# Patient Record
Sex: Female | Born: 1935
Health system: Southern US, Community
[De-identification: ages and names within clinical notes are randomized; demographics above are authoritative.]

## PROBLEM LIST (undated history)

## (undated) DIAGNOSIS — D649 Anemia, unspecified: Secondary | ICD-10-CM

## (undated) DIAGNOSIS — K219 Gastro-esophageal reflux disease without esophagitis: Secondary | ICD-10-CM

## (undated) DIAGNOSIS — G2581 Restless legs syndrome: Secondary | ICD-10-CM

## (undated) DIAGNOSIS — E7800 Pure hypercholesterolemia, unspecified: Secondary | ICD-10-CM

## (undated) DIAGNOSIS — R918 Other nonspecific abnormal finding of lung field: Secondary | ICD-10-CM

## (undated) DIAGNOSIS — R911 Solitary pulmonary nodule: Secondary | ICD-10-CM

## (undated) DIAGNOSIS — C801 Malignant (primary) neoplasm, unspecified: Secondary | ICD-10-CM

## (undated) DIAGNOSIS — I10 Essential (primary) hypertension: Secondary | ICD-10-CM

## (undated) DIAGNOSIS — M8580 Other specified disorders of bone density and structure, unspecified site: Secondary | ICD-10-CM

## (undated) DIAGNOSIS — N189 Chronic kidney disease, unspecified: Secondary | ICD-10-CM

## (undated) DIAGNOSIS — M1990 Unspecified osteoarthritis, unspecified site: Secondary | ICD-10-CM

## (undated) DIAGNOSIS — K5790 Diverticulosis of intestine, part unspecified, without perforation or abscess without bleeding: Secondary | ICD-10-CM

## (undated) HISTORY — DX: Essential (primary) hypertension: I10

## (undated) HISTORY — DX: Other specified disorders of bone density and structure, unspecified site: M85.80

## (undated) HISTORY — PX: OTHER SURGICAL HISTORY: SHX169

## (undated) HISTORY — DX: Restless legs syndrome: G25.81

## (undated) HISTORY — DX: Pure hypercholesterolemia, unspecified: E78.00

## (undated) HISTORY — PX: ABDOMINAL HYSTERECTOMY: SHX81

## (undated) HISTORY — PX: EYE SURGERY: SHX253

## (undated) HISTORY — DX: Diverticulosis of intestine, part unspecified, without perforation or abscess without bleeding: K57.90

---

## 2000-03-20 ENCOUNTER — Encounter: Payer: Self-pay | Admitting: Cardiology

## 2000-03-20 ENCOUNTER — Encounter: Admission: RE | Admit: 2000-03-20 | Discharge: 2000-03-20 | Payer: Self-pay | Admitting: Cardiology

## 2001-06-04 ENCOUNTER — Other Ambulatory Visit: Admission: RE | Admit: 2001-06-04 | Discharge: 2001-06-04 | Payer: Self-pay | Admitting: Internal Medicine

## 2001-10-08 ENCOUNTER — Encounter: Payer: Self-pay | Admitting: Family Medicine

## 2001-10-08 ENCOUNTER — Encounter: Admission: RE | Admit: 2001-10-08 | Discharge: 2001-10-08 | Payer: Self-pay | Admitting: Family Medicine

## 2002-06-05 ENCOUNTER — Ambulatory Visit (HOSPITAL_COMMUNITY): Admission: RE | Admit: 2002-06-05 | Discharge: 2002-06-05 | Payer: Self-pay | Admitting: Gastroenterology

## 2007-01-10 ENCOUNTER — Ambulatory Visit (HOSPITAL_COMMUNITY): Admission: RE | Admit: 2007-01-10 | Discharge: 2007-01-10 | Payer: Self-pay | Admitting: Internal Medicine

## 2008-04-04 ENCOUNTER — Emergency Department (HOSPITAL_COMMUNITY): Admission: EM | Admit: 2008-04-04 | Discharge: 2008-04-04 | Payer: Self-pay | Admitting: Emergency Medicine

## 2011-01-05 ENCOUNTER — Encounter
Admission: RE | Admit: 2011-01-05 | Discharge: 2011-01-05 | Payer: Self-pay | Source: Home / Self Care | Attending: Internal Medicine | Admitting: Internal Medicine

## 2011-02-05 ENCOUNTER — Other Ambulatory Visit: Payer: Self-pay | Admitting: Internal Medicine

## 2011-02-05 DIAGNOSIS — R928 Other abnormal and inconclusive findings on diagnostic imaging of breast: Secondary | ICD-10-CM

## 2011-02-12 ENCOUNTER — Other Ambulatory Visit: Payer: Self-pay | Admitting: Internal Medicine

## 2011-02-12 ENCOUNTER — Ambulatory Visit
Admission: RE | Admit: 2011-02-12 | Discharge: 2011-02-12 | Disposition: A | Discharge status: Home / Self Care | Payer: Medicare Other | Source: Ambulatory Visit | Attending: Internal Medicine | Admitting: Internal Medicine

## 2011-02-12 ENCOUNTER — Ambulatory Visit
Admission: RE | Admit: 2011-02-12 | Discharge: 2011-02-12 | Disposition: A | Discharge status: Home / Self Care | Payer: Medicare Other | Source: Ambulatory Visit

## 2011-02-12 DIAGNOSIS — R928 Other abnormal and inconclusive findings on diagnostic imaging of breast: Secondary | ICD-10-CM

## 2011-02-16 ENCOUNTER — Other Ambulatory Visit: Payer: Self-pay | Admitting: Diagnostic Radiology

## 2011-02-16 ENCOUNTER — Ambulatory Visit
Admission: RE | Admit: 2011-02-16 | Discharge: 2011-02-16 | Disposition: A | Discharge status: Home / Self Care | Payer: Medicare Other | Source: Ambulatory Visit | Attending: Internal Medicine | Admitting: Internal Medicine

## 2011-02-16 ENCOUNTER — Other Ambulatory Visit: Payer: Self-pay | Admitting: Internal Medicine

## 2011-02-16 DIAGNOSIS — R928 Other abnormal and inconclusive findings on diagnostic imaging of breast: Secondary | ICD-10-CM

## 2011-09-25 LAB — DIFFERENTIAL
Basophils Absolute: 0
Basophils Relative: 0
Eosinophils Absolute: 0.1
Monocytes Relative: 11
Neutro Abs: 4
Neutrophils Relative %: 68

## 2011-09-25 LAB — CBC
HCT: 33.6 — ABNORMAL LOW
Hemoglobin: 12
MCHC: 35.7
RBC: 3.54 — ABNORMAL LOW
RDW: 13.5

## 2011-09-25 LAB — COMPREHENSIVE METABOLIC PANEL
ALT: 57 — ABNORMAL HIGH
Alkaline Phosphatase: 86
BUN: 48 — ABNORMAL HIGH
CO2: 27
GFR calc non Af Amer: 25 — ABNORMAL LOW
Glucose, Bld: 106 — ABNORMAL HIGH
Potassium: 3.5
Sodium: 138
Total Protein: 6.8

## 2011-09-25 LAB — URINALYSIS, ROUTINE W REFLEX MICROSCOPIC
Hgb urine dipstick: NEGATIVE
Nitrite: NEGATIVE
Protein, ur: NEGATIVE
Specific Gravity, Urine: 1.012
Urobilinogen, UA: 0.2

## 2011-09-25 LAB — URINE MICROSCOPIC-ADD ON: Urine-Other: NONE SEEN

## 2011-12-11 ENCOUNTER — Other Ambulatory Visit: Payer: Self-pay | Admitting: *Deleted

## 2012-01-04 ENCOUNTER — Other Ambulatory Visit: Payer: Self-pay | Admitting: Internal Medicine

## 2012-01-04 DIAGNOSIS — Z87891 Personal history of nicotine dependence: Secondary | ICD-10-CM | POA: Diagnosis not present

## 2012-01-04 DIAGNOSIS — E7800 Pure hypercholesterolemia, unspecified: Secondary | ICD-10-CM | POA: Diagnosis not present

## 2012-01-04 DIAGNOSIS — Z79899 Other long term (current) drug therapy: Secondary | ICD-10-CM | POA: Diagnosis not present

## 2012-01-04 DIAGNOSIS — Z0000 Encounter for general adult medical examination without abnormal findings: Secondary | ICD-10-CM | POA: Diagnosis not present

## 2012-01-04 DIAGNOSIS — R5381 Other malaise: Secondary | ICD-10-CM | POA: Diagnosis not present

## 2012-01-04 DIAGNOSIS — R5383 Other fatigue: Secondary | ICD-10-CM | POA: Diagnosis not present

## 2012-01-04 DIAGNOSIS — R198 Other specified symptoms and signs involving the digestive system and abdomen: Secondary | ICD-10-CM | POA: Diagnosis not present

## 2012-01-04 DIAGNOSIS — I10 Essential (primary) hypertension: Secondary | ICD-10-CM | POA: Diagnosis not present

## 2012-01-04 DIAGNOSIS — R634 Abnormal weight loss: Secondary | ICD-10-CM

## 2012-01-04 DIAGNOSIS — G2589 Other specified extrapyramidal and movement disorders: Secondary | ICD-10-CM | POA: Diagnosis not present

## 2012-01-04 DIAGNOSIS — E559 Vitamin D deficiency, unspecified: Secondary | ICD-10-CM | POA: Diagnosis not present

## 2012-01-08 ENCOUNTER — Other Ambulatory Visit: Payer: Self-pay | Admitting: Internal Medicine

## 2012-01-08 ENCOUNTER — Ambulatory Visit
Admission: RE | Admit: 2012-01-08 | Discharge: 2012-01-08 | Disposition: A | Discharge status: Home / Self Care | Payer: Medicare Other | Source: Ambulatory Visit | Attending: Internal Medicine | Admitting: Internal Medicine

## 2012-01-08 DIAGNOSIS — R198 Other specified symptoms and signs involving the digestive system and abdomen: Secondary | ICD-10-CM | POA: Diagnosis not present

## 2012-01-08 DIAGNOSIS — R634 Abnormal weight loss: Secondary | ICD-10-CM | POA: Diagnosis not present

## 2012-01-08 DIAGNOSIS — K5730 Diverticulosis of large intestine without perforation or abscess without bleeding: Secondary | ICD-10-CM | POA: Diagnosis not present

## 2012-01-09 ENCOUNTER — Other Ambulatory Visit: Payer: Self-pay | Admitting: Internal Medicine

## 2012-01-09 DIAGNOSIS — R911 Solitary pulmonary nodule: Secondary | ICD-10-CM

## 2012-01-10 ENCOUNTER — Other Ambulatory Visit: Payer: Medicare Other

## 2012-01-10 ENCOUNTER — Ambulatory Visit
Admission: RE | Admit: 2012-01-10 | Discharge: 2012-01-10 | Disposition: A | Discharge status: Home / Self Care | Payer: Medicare Other | Source: Ambulatory Visit | Attending: Internal Medicine | Admitting: Internal Medicine

## 2012-01-10 DIAGNOSIS — J984 Other disorders of lung: Secondary | ICD-10-CM | POA: Diagnosis not present

## 2012-01-10 DIAGNOSIS — R911 Solitary pulmonary nodule: Secondary | ICD-10-CM | POA: Diagnosis not present

## 2012-01-10 DIAGNOSIS — R918 Other nonspecific abnormal finding of lung field: Secondary | ICD-10-CM | POA: Diagnosis not present

## 2012-01-14 ENCOUNTER — Encounter: Payer: Self-pay | Admitting: Internal Medicine

## 2012-01-15 ENCOUNTER — Other Ambulatory Visit: Payer: Self-pay | Admitting: Cardiothoracic Surgery

## 2012-01-15 DIAGNOSIS — D381 Neoplasm of uncertain behavior of trachea, bronchus and lung: Secondary | ICD-10-CM

## 2012-01-17 ENCOUNTER — Encounter: Payer: Self-pay | Admitting: Cardiothoracic Surgery

## 2012-01-17 ENCOUNTER — Institutional Professional Consult (permissible substitution) (INDEPENDENT_AMBULATORY_CARE_PROVIDER_SITE_OTHER): Payer: Medicare Other | Admitting: Cardiothoracic Surgery

## 2012-01-17 VITALS — BP 128/65 | HR 77 | Resp 18 | Ht 63.0 in | Wt 111.0 lb

## 2012-01-17 DIAGNOSIS — M899 Disorder of bone, unspecified: Secondary | ICD-10-CM

## 2012-01-17 DIAGNOSIS — M109 Gout, unspecified: Secondary | ICD-10-CM | POA: Insufficient documentation

## 2012-01-17 DIAGNOSIS — G2581 Restless legs syndrome: Secondary | ICD-10-CM

## 2012-01-17 DIAGNOSIS — H9190 Unspecified hearing loss, unspecified ear: Secondary | ICD-10-CM | POA: Insufficient documentation

## 2012-01-17 DIAGNOSIS — J309 Allergic rhinitis, unspecified: Secondary | ICD-10-CM | POA: Insufficient documentation

## 2012-01-17 DIAGNOSIS — E7800 Pure hypercholesterolemia, unspecified: Secondary | ICD-10-CM

## 2012-01-17 DIAGNOSIS — K5730 Diverticulosis of large intestine without perforation or abscess without bleeding: Secondary | ICD-10-CM

## 2012-01-17 DIAGNOSIS — R918 Other nonspecific abnormal finding of lung field: Secondary | ICD-10-CM

## 2012-01-17 DIAGNOSIS — M8580 Other specified disorders of bone density and structure, unspecified site: Secondary | ICD-10-CM

## 2012-01-17 DIAGNOSIS — K5790 Diverticulosis of intestine, part unspecified, without perforation or abscess without bleeding: Secondary | ICD-10-CM | POA: Insufficient documentation

## 2012-01-17 DIAGNOSIS — M949 Disorder of cartilage, unspecified: Secondary | ICD-10-CM

## 2012-01-17 DIAGNOSIS — I10 Essential (primary) hypertension: Secondary | ICD-10-CM | POA: Insufficient documentation

## 2012-01-17 DIAGNOSIS — R222 Localized swelling, mass and lump, trunk: Secondary | ICD-10-CM

## 2012-01-17 NOTE — Progress Notes (Signed)
[address]            [place] [address]          [phone]      KELSYE LOOMER [facility] Medical Record #[id number] Date of Birth: [birth year]  Referring: [name], MD Primary Care: [name], MD, MD  Chief Complaint:    Chief Complaint  Patient presents with  . Lung Mass    Referral from Dr. Kevan Ny on Lung Mass (2), Chest CT  01/10/2012, PFTS/PET 01/22/2012    History of Present Illness:    Patient is a 78 year old female with a remote smoking history who presents with a 12 pound weight loss over the past 6 months. She's had a dry cough no hemoptysis. She denies fever chills or night sweats. She's had no change in bowel habits. Because of these symptoms she saw Dr. Kevan Ny and a CT scan of the abdomen was performed. 2 right lower lobe lung masses were detected and a followup CT scan of the chest was performed. A PET scan is pending. She denies chest pain or shortness of breath. She denies any bone pain.      Current Activity/ Functional Status: Patient is independent with mobility/ambulation, transfers, ADL's, IADL's.   Past Medical History  Diagnosis Date  . Hypercholesterolemia   . Gout   . Allergic rhinitis   . Restless leg syndrome   . HTN (hypertension)   . Diverticulosis   . Hearing loss   . Osteopenia     Past Surgical History  Procedure Date  . C section x2   . Tah ovaries intact   . Rt leg vein strippin for varicosities   . Cataract extractions bilateral 2010/2011  . [place]     Family History  Problem Relation Age of Onset  . Heart disease Father   . Alcohol abuse Brother     History   Social History  . Marital Status: Single    Spouse Name: N/A    Number of Children: N/A  . Years of Education: N/A   Occupational History  . Not on file.   Social History Main Topics  . Smoking status: Former Smoker    Types: Cigarettes    Quit date: 01/01/1964  . Smokeless tobacco: Never  Used  . Alcohol Use: 5.0 oz/week    0 Glasses of wine, 0 Cans of beer, 0 Shots of liquor, 10 Drinks containing 0.5 oz of alcohol per week  . Drug Use: No  . Sexually Active: Not on file   Other Topics Concern  . 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 Ann Maki- in-Law Deborra Medina died suddenly of MI at age 32 yo.     History  Smoking status  . Former Smoker  . Types: Cigarettes  . Quit date: 01/01/1964  Smokeless tobacco  . Never Used    History  Alcohol Use  . 5.0 oz/week  . 0 Glasses of wine, 0 Cans of beer, 0 Shots of liquor, 10 Drinks containing 0.5 oz of alcohol per week     Allergies  Allergen Reactions  . Bee Venom Anaphylaxis    Allergy to bee stings, severe  . Neosporin (Neomycin-Polymyx-Gramicid) Dermatitis  . Statins Other (See Comments)    Myalgias and unsteady gait  Current Outpatient Prescriptions  Medication Sig Dispense Refill  . acetaminophen (TYLENOL) 325 MG tablet Take 650 mg by mouth every 6 (six) hours as needed.      Marland Kitchen allopurinol (ZYLOPRIM) 100 MG tablet Take 100 mg by mouth 2 (two) times daily.       Marland Kitchen ALPRAZolam (XANAX) 0.25 MG tablet Take 0.25 mg by mouth at bedtime as needed.      Marland Kitchen amLODipine-olmesartan (AZOR) 10-40 MG per tablet Take 1 tablet by mouth daily.      Marland Kitchen aspirin 81 MG tablet Take 81 mg by mouth daily. Mon/Wed/Fri.'s      . calcium-vitamin D (OSCAL WITH D) 500-200 MG-UNIT per tablet Take 1 tablet by mouth 2 (two) times daily.      . colchicine 0.6 MG tablet Take 0.6 mg by mouth 2 (two) times daily. PRN      . EPINEPHrine (EPIPEN 2-PAK) 0.3 mg/0.3 mL DEVI Inject 0.3 mg into the muscle once.      . folic acid (FOLVITE) 1 MG tablet Take 1 mg by mouth daily.      . furosemide (LASIX) 40 MG tablet Take 40 mg by mouth daily as needed. FOR LEG SWELLING      . ibuprofen (ADVIL,MOTRIN) 200 MG tablet Take 200 mg by mouth every 6  (six) hours as needed.      . pramipexole (MIRAPEX) 0.125 MG tablet Take 0.125 mg by mouth at bedtime as needed.           Review of Systems:     Cardiac Review of Systems: Y or N  Chest Pain [  n  ]  Resting SOB [n   ] Exertional SOB  [ n ]  Orthopnea [ n ]   Pedal Edema [ n  ]    Palpitations [n  ] Syncope  [n  ]   Presyncope [ n  ]  General Review of Systems: [Y] = yes [  ]=no Constitional: recent weight change [place]ienne  ]; anorexia [ y ]; fatigue [ n ]; nausea [ n ]; night sweats [ n ]; fever [ n ]; or chills [n  ];                                                                                                                                          Dental: poor dentition[  ];   Eye : blurred vision [  ]; diplopia [   ]; vision changes [  ];  Amaurosis fugax[  ]; Resp: cough [ y ];  wheezing[ n ];  hemoptysis[n  ]; shortness of breath[ n ]; paroxysmal nocturnal dyspnea[  n]; dyspnea on exertion[n  ]; or orthopnea[n  ];  GI:  gallstones[  ], vomiting[  ];  dysphagia[  ]; melena[n  ];  hematochezia [n  ]; heartburn[  ];   Hx of  Colonoscopy[  ];  GU: kidney stones [  ]; hematuria[  ];   dysuria [  ];  nocturia[  ];  history of     obstruction [  ];             Skin: rash, swelling[  ];, hair loss[  ];  peripheral edema[  ];  or itching[  ]; Musculosketetal: myalgias[  ];  joint swelling[  ];  joint erythema[  ];  joint pain[ n ];  back pain[n  ];  Heme/Lymph: bruising[n  ];  bleeding[ n ];  anemia[  ];  Neuro: TIA[  ];  headaches[  ];  stroke[n];  vertigo[ n ];  seizures[ n ];   paresthesias[n  ];  difficulty walking[ n ];  Psych:depression[  ]; anxiety[  ];  Endocrine: diabetes[  ];  thyroid dysfunction[  ];  Immunizations: Flu [  ]; Pneumococcal[  ];  Other:  Physical Exam: BP 128/65  Pulse 77  Resp 18  Ht 5\' 3"  (1.6 m)  Wt 111 lb (50.349 kg)  BMI 19.66 kg/m2  SpO2 99%  General appearance: alert, cooperative, appears stated age and no distress Neurologic: intact Heart: regular  rate and rhythm, S1, S2 normal, no murmur, click, rub or gallop Lungs: clear to auscultation bilaterally Abdomen: soft, non-tender; bowel sounds normal; no masses,  no organomegaly Extremities: extremities normal, atraumatic, no cyanosis or edema and Homans sign is negative, no sign of DVT No cervical or supraclavicular adenopathy No carotid bruits   Diagnostic Studies & Laboratory data:     Recent Radiology Findings:  Ct Abdomen Pelvis Wo Contrast  01/08/2012  *RADIOLOGY REPORT*  Clinical Data: Unexplained weight loss, change in bowel habits  CT ABDOMEN AND PELVIS WITHOUT CONTRAST  Technique:  Multidetector CT imaging of the abdomen and pelvis was performed following the standard protocol without intravenous contrast.  Comparison: None - - - no IV contrast media was given due to abnormal renal laboratory values and GFR of 27.  Findings: There is an abnormal nodular opacity abutting the right hemidiaphragm at the right lung base within the right lower lobes suspicious for lung neoplasm.  On sagittal and coronal images this area measures 2.5 cm in width, 2.6 cm in depth, and 1.1 cm in height.  A 9 mm nodular lesion also is noted more posteriorly within the right lower lobe suspicious for metastasis.  CT of the chest with IV contrast may be warranted to evaluate for mediastinal or hilar adenopathy as well as additional liver lesions.  The liver is unremarkable in the unenhanced state.  No ductal dilatation is seen.  The gallbladder is contracted with no calcified gallstones.  There is some higher attenuation material layering within the gallbladder representing either gallbladder sludge or noncalcified gallstones.  The pancreas is normal in size and the pancreatic duct is not dilated.  The adrenal glands and spleen are unremarkable.  The stomach is moderately distended with fluid with no abnormality noted.  The kidneys are unremarkable in the unenhanced state.  No renal calculi or mass is seen and no  hydronephrosis is noted.  The abdominal aorta is normal in caliber with atheromatous change present.  The urinary bladder is decompressed and cannot be evaluated.  The uterus has been resected previously.  No adnexal lesion is seen. No fluid is noted within the pelvis.  There are multiple rectosigmoid colonic diverticula present.  Diverticula also are scattered throughout the remainder of the colon.  The terminal ileum is unremarkable.  Degenerative changes are present  throughout the lumbar spine.  IMPRESSION:  1.  Abnormal nodular lesion at the right lung base suspicious for primary lung carcinoma.  Recommend CT of the chest with IV contrast to assess further.  Probable adjacent metastatic lesion in the right lower lobe as well. 2.  No abdominal or pelvic mass or adenopathy. 3.  Diffuse colonic diverticula.  Original Report Authenticated By: Juline Patch, M.D.   Ct Chest Wo Contrast  01/10/2012  *RADIOLOGY REPORT*  Clinical Data: Left lower lobe lung lesion noted on recent CT of the abdomen pelvis, follow-up, smoking history, weight loss  CT CHEST WITHOUT CONTRAST  Technique:  Multidetector CT imaging of the chest was performed following the standard protocol without IV contrast.  Comparison: CT abdomen pelvis of 01/08/2012  Findings: The irregular nodular lesion abutting the right hemidiaphragm within the right lower lobe at the right lung base is again noted.  This area is somewhat difficult to measure, and on the current study better seen on the sagittal and coronal images, this area has a a depth of 2.0 cm, height of 1.3 cm, width of approximately 1.8 cm.  There are several small surrounding nodular lesions within the right lower lobe, as well as a 9-mm nodule posteriorly in the right lower lobe.  In addition there is a right middle lobe nodule of 7 mm and a right upper lobe lesion abutting the fissure measuring 7 mm.  The dominant lesion abutting the hemidiaphragm at the right lung base is highly  suspicious for primary lung carcinoma with apparent ipsilateral lung metastases. No left lung nodules are seen.  No pleural effusion is noted.  PET- CT may be helpful in assessing metabolic activity of this lesion and possible metastases.  On soft tissue window images, the thyroid gland is unremarkable. On this unenhanced study, no mediastinal or hilar adenopathy is seen with a few small precarinal nodes present.  No axillary adenopathy is noted.  There are degenerative changes diffusely throughout the thoracic spine.  IMPRESSION:  1.  Irregularly marginated lesion abutting the right hemidiaphragm within the right lower lobe highly suspicious for primary lung carcinoma. 2.  Small nodular opacities within the right lower lobe, right middle lobe, and right upper lobe suspicious for ipsilateral metastases.  Original Report Authenticated By: Juline Patch, M.D.   Recent Lab Findings: Lab Results  Component Value Date   WBC 6.0 04/04/2008   HGB 12.0 04/04/2008   HCT 33.6* 04/04/2008   PLT 210 04/04/2008   GLUCOSE 106* 04/04/2008   ALT 57* 04/04/2008   AST 38* 04/04/2008   NA 138 04/04/2008   K 3.5 04/04/2008   CL 99 04/04/2008   CREATININE 1.96* 04/04/2008   BUN 48* 04/04/2008   CO2 27 04/04/2008      Assessment / Plan:     Patient is an 78 year old female with history of smoking in the past who presents with incidental finding of 3 separate lesions in the right lung, one complex and on the diaphragm in 2 separate 7-9 mm lesions. I discussed with the patient The possibility of lung cancer and have recommended further followup including pulmonary functions PET scan. After the PET scan is obtained and reviewed recommendations to obtain a tissue diagnosis will be made possibly with ENB or fine needle aspiration. I will see the patient back next week following the completion of the PET scan. She also has chronic renal insufficiency with the creatinine ranging between 1.96 and 1.71.       Gwenith Daily  Tyrone Sage MD  Beeper  [phone] Office [phone] 01/17/2012 11:16 AM

## 2012-01-17 NOTE — Patient Instructions (Signed)
Pet Scan and Lung function tests are arranged   Pulmonary Nodule A pulmonary (lung) nodule is small, round growth in the lung. The size of a pulmonary nodule can be as small as a pencil eraser (1/5 inch or 4 millmeters) to a little bigger than your biggest toenail (1 inch or 25 millimeters). A pulmonary nodule is usually an unplanned finding. It may be found on a chest X-ray or a computed tomography (CT) scan when you have imaging tests of your lungs done. When a pulmonary nodule is found, tests will be done to determine if the nodule is benign (not cancerous) or malignant (cancerous). Follow-up treatment or testing is based on the size of the pulmonary nodule and your risk of getting lung cancer.  CAUSES Causes of pulmonary nodules can vary.  Benign pulmonary nodules  can be caused from different things. Some of these things include:  Infection. This can be a common cause of a benign pulmonary nodule. The infection may be active (a current infection) or an old infection that is no longer active. Three types of infections can cause a pulmonary nodule. These are:   Bacterial Infection.   Fungal infection.   Viral Infections.   Hematoma. This is a bruise in the lung. A hematoma can happen from an injury to your chest.   Some common diseases can lead to benign pulmonary nodules. For example, rheumatoid arthritis can be a cause of a pulmonary nodule.   Other unusual things can cause a benign pulmonary nodule. These can include:   Having had tuberculosis.   Rare diseases, such as a lung cyst.  Malignant pulmonary nodules.  These are cancerous growths. The cancer may have:  Started in the lung. Some lung cancers first detected as a pulmonary nodule.   Spread to the lung from cancer somewhere else in the body. This is called metastatic cancer.   Certain risk factors make a cancerous pulmonary nodule more likely. They include:   Age. As people get older, a pulmonary nodule is more likely to  be cancerous.   Cancer history. If one of your immediate family members has had cancer, you have a higher risk of developing cancer.   Smoking. This includes people who currently smoke and those who have quit.  DIAGNOSIS To diagnose whether a pulmonary nodule is benign or malignant, a variety of tests will be done. This includes things such as:  Health history. Questions regarding your current health, past health, and family health will be asked.   Blood tests. Results of blood work can show:   Tumor markers for cancer.   Any type of infection.   A skin test called a tuberculin (TB) test may be done. This test can tell if you have been exposed to the germ that causes tuberculosis.   Imaging tests. These take pictures of your lungs. Types of imaging tests include:   Chest X-ray. This can help in several ways. An X-ray gives a close-up look at the pulmonary nodule. A new X-ray can be compared with any X-rays you have had in the past.    Computed tomography  (CT) scan. This test shows smaller pulmonary nodules more clearly than an X-ray.   Positron emission tomography  (PET) scan. This is a test that uses a radioactive substance to identify a pulmonary nodule. A safe amount of radioactive substance is injected into the blood stream. Then, the scan takes a picture of the pulmonary nodule. A malignant pulmonary nodule will absorb the substance faster  than a benign pulmonary nodule. The radioactive substance is eliminated from your body in your urine.   Biopsy.  This removes a tiny piece of the pulmonary nodule so it can be checked under a microscope. Medicine will be given to help keep you relaxed and pain free when a biopsy is done. Types of biopsies include:   Bronchoscopy . This is a surgical procedure. It can be used for pulmonary nodules that are close to the airways in the lung. It uses a scope (a thin tube) with a tiny camera and light on the end. The scope is put in the windpipe.  Your caregiver can then see inside the lung. A tiny tool put through the scope is used to take a small sample of the pulmonary nodule tissue.   Transthoracic needle aspiration . This method is used if the pulmonary nodule is far away from the air passages in the lung. A long, thin needle is put through the chest into the lung nodule. A CT scan is done at the same time which can make it easier to locate the pulmonary nodule.   Surgical lung biopsy . This is a surgical procedure in which the pulmonary nodule is removed. This is usually recommended when the pulmonary nodule is most likely malignant or a biopsy cannot be obtained by either bronchoscopy or transthoracic needle aspiration.  PULMONARY NODULE FOLLOW-UP RECOMMENDATIONS The frequency of pulmonary nodule follow-up is based on your risk factors and size of the pulmonary nodule. If your caregiver suspects the pulmonary nodule is cancerous or the pulmonary nodule changes during any of the follow-up CT scans, additional testing or biopsies will be done.   If you have no or low risk of getting lung cancer (non-smoker, no personal cancer history), recommended follow-up is based on the following pulmonary nodule size:   A pulmonary nodule that is < 4 mm does not require any follow-up.   A pulmonary nodule that is 4 to 6 mm should be re-imaged by CT scan in 12 months.   A pulmonary nodule that is 6 to 8 mm should be re-imaged by CT scan at 6 to 12 months and then again at 18 to 24 months if no change in size.   A pulmonary nodule > 8 mm in size should be followed closely and re-imaged by CT scan at 3, 9, and 24 months.    If you are at risk of getting lung cancer (current or former smoker, family history of cancer), recommended follow-up is based on the following pulmonary nodule size:   A pulmonary nodule that is < 4 mm in size should be re-imaged by CT scan in 12 months.   A pulmonary nodule that is 4 to 6 mm in size should be re-imaged by CT  scan at 6 to 12 months and again at 18 to 24 months.   A pulmonary nodule that is 6 to 8 mm in size should be re-imaged by CT scan at 3, 9, and 24 months.   A pulmonary nodule > 8 mm in size should be followed closely and re-imaged by CT scan at 3, 9, and 24 months.  SEEK MEDICAL CARE IF: While waiting for test results to determine what type of pulmonary nodule you have, be sure to contact your caregiver if you:  Have trouble breathing when you are active.   Feel sick or unusually tired.   Do not feel like eating.   Lose weight without trying to.   Develop chills  or night sweats.   Mild or moderate fevers generally have no long-term effects and often do not require treatment. There are a few exceptions (see below).  SEEK IMMEDIATE MEDICAL CARE IF:  You cannot catch your breath or you begin wheezing.   You cannot stop coughing.   You cough up blood.   You feel like you are going to pass out or become dizzy.   You have sudden chest pain.   You have a fever or persistent symptoms for more than 72 hours.   You have a fever and your symptoms suddenly get worse.  MAKE SURE YOU   Understand these instructions.   Will watch your condition.   Will get help right away if you are not doing well or get worse.  Document Released: 10/14/2009 Document Revised: 08/29/2011 Document Reviewed: 10/14/2009 [facility] Patient Information 2012 [place], [place].

## 2012-01-22 ENCOUNTER — Ambulatory Visit (HOSPITAL_COMMUNITY)
Admission: RE | Admit: 2012-01-22 | Discharge: 2012-01-22 | Disposition: A | Discharge status: Home / Self Care | Payer: Medicare Other | Source: Ambulatory Visit | Attending: Cardiothoracic Surgery | Admitting: Cardiothoracic Surgery

## 2012-01-22 ENCOUNTER — Encounter (HOSPITAL_COMMUNITY)
Admission: RE | Admit: 2012-01-22 | Discharge: 2012-01-22 | Disposition: A | Discharge status: Home / Self Care | Payer: Medicare Other | Source: Ambulatory Visit | Attending: Cardiothoracic Surgery | Admitting: Cardiothoracic Surgery

## 2012-01-22 ENCOUNTER — Encounter (HOSPITAL_COMMUNITY): Payer: Self-pay

## 2012-01-22 DIAGNOSIS — R222 Localized swelling, mass and lump, trunk: Secondary | ICD-10-CM | POA: Diagnosis not present

## 2012-01-22 DIAGNOSIS — R942 Abnormal results of pulmonary function studies: Secondary | ICD-10-CM | POA: Insufficient documentation

## 2012-01-22 DIAGNOSIS — D381 Neoplasm of uncertain behavior of trachea, bronchus and lung: Secondary | ICD-10-CM

## 2012-01-22 DIAGNOSIS — Z87891 Personal history of nicotine dependence: Secondary | ICD-10-CM | POA: Diagnosis not present

## 2012-01-22 DIAGNOSIS — R918 Other nonspecific abnormal finding of lung field: Secondary | ICD-10-CM | POA: Diagnosis not present

## 2012-01-22 DIAGNOSIS — J984 Other disorders of lung: Secondary | ICD-10-CM | POA: Insufficient documentation

## 2012-01-22 DIAGNOSIS — C3490 Malignant neoplasm of unspecified part of unspecified bronchus or lung: Secondary | ICD-10-CM | POA: Diagnosis not present

## 2012-01-22 HISTORY — DX: Malignant (primary) neoplasm, unspecified: C80.1

## 2012-01-22 LAB — PULMONARY FUNCTION TEST

## 2012-01-22 LAB — GLUCOSE, CAPILLARY: Glucose-Capillary: 102 mg/dL — ABNORMAL HIGH (ref 70–99)

## 2012-01-22 MED ORDER — ALBUTEROL SULFATE (5 MG/ML) 0.5% IN NEBU
2.5000 mg | INHALATION_SOLUTION | Freq: Once | RESPIRATORY_TRACT | Status: AC
  Administered 2012-01-22: 2.5 mg via RESPIRATORY_TRACT

## 2012-01-22 MED ORDER — FLUDEOXYGLUCOSE F - 18 (FDG) INJECTION
19.9000 | Freq: Once | INTRAVENOUS | Status: AC | PRN
  Administered 2012-01-22: 19.9 via INTRAVENOUS

## 2012-01-23 ENCOUNTER — Other Ambulatory Visit: Payer: Self-pay

## 2012-01-23 ENCOUNTER — Ambulatory Visit (INDEPENDENT_AMBULATORY_CARE_PROVIDER_SITE_OTHER): Payer: Medicare Other | Admitting: Cardiothoracic Surgery

## 2012-01-23 VITALS — BP 128/70 | HR 69 | Resp 14 | Ht 63.0 in | Wt 111.0 lb

## 2012-01-23 DIAGNOSIS — D381 Neoplasm of uncertain behavior of trachea, bronchus and lung: Secondary | ICD-10-CM

## 2012-01-23 DIAGNOSIS — R911 Solitary pulmonary nodule: Secondary | ICD-10-CM

## 2012-01-24 ENCOUNTER — Encounter: Payer: Self-pay | Admitting: Cardiothoracic Surgery

## 2012-01-24 ENCOUNTER — Encounter: Payer: Medicare Other | Admitting: Cardiothoracic Surgery

## 2012-01-24 NOTE — Progress Notes (Signed)
[address]            [place] [address]          [phone]      April Burns [facility] Medical Record #[id number] Date of Birth: March 30, 1927  Referring: [name], MD Primary Care: [name], MD, MD  Chief Complaint:    Chief Complaint  Patient presents with  . Lung Lesion    1 week f/u S/P PET/PFT    History of Present Illness:    Patient is a 79 year old female with a remote smoking history who presents with a 12 pound weight loss over the past 6 months. She's had a dry cough no hemoptysis. She denies fever chills or night sweats. She's had no change in bowel habits. Because of these symptoms she saw Dr. Kevan Ny and a CT scan of the abdomen was performed. 2 right lower lobe lung masses were detected and a followup CT scan of the chest was performed.  scan  She denies chest pain or shortness of breath. She denies any bone pain.   Patient returns today after up to obtain PFTs and a PET scan to further evaluate the right lung lesion   Current Activity/ Functional Status: Patient is independent with mobility/ambulation, transfers, ADL's, IADL's.   Past Medical History  Diagnosis Date  . Hypercholesterolemia   . Gout   . Allergic rhinitis   . Restless leg syndrome   . HTN (hypertension)   . Diverticulosis   . Hearing loss   . Osteopenia   . Cancer     Past Surgical History  Procedure Date  . C section x2   . Tah ovaries intact   . Rt leg vein strippin for varicosities   . Cataract extractions bilateral 2010/2011  . [place]     Family History  Problem Relation Age of Onset  . Heart disease Father   . Alcohol abuse Brother     History   Social History  . Marital Status: Single    Spouse Name: N/A    Number of Children: N/A  . Years of Education: N/A   Occupational History  . Not on file.   Social History Main Topics  . Smoking status: Former Smoker -- 1.0 packs/day    Types: Cigarettes     Quit date: 12/31/1993  . Smokeless tobacco: Never Used  . Alcohol Use: 5.0 oz/week    0 Glasses of wine, 0 Cans of beer, 0 Shots of liquor, 10 Drinks containing 0.5 oz of alcohol per week  . Drug Use: No  . Sexually Active: Not on file   Other Topics Concern  . 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 Ann Maki- in-Law Deborra Medina died suddenly of MI at age 44 yo.     History  Smoking status  . Former Smoker -- 1.0 packs/day  . Types: Cigarettes  . Quit date: 12/31/1993  Smokeless tobacco  . Never Used    History  Alcohol Use  . 5.0 oz/week  . 0 Glasses of wine, 0 Cans of beer, 0 Shots of liquor, 10 Drinks containing 0.5 oz of alcohol per week     Allergies  Allergen Reactions  . Bee Venom Anaphylaxis    Allergy to bee stings, severe  .  Neosporin (Neomycin-Polymyx-Gramicid) Dermatitis  . Statins Other (See Comments)    Myalgias and unsteady gait    Current Outpatient Prescriptions  Medication Sig Dispense Refill  . acetaminophen (TYLENOL) 325 MG tablet Take 650 mg by mouth every 6 (six) hours as needed.      Marland Kitchen allopurinol (ZYLOPRIM) 100 MG tablet Take 100 mg by mouth 2 (two) times daily.       Marland Kitchen amLODipine-olmesartan (AZOR) 10-40 MG per tablet Take 1 tablet by mouth daily.      Marland Kitchen aspirin 81 MG tablet Take 81 mg by mouth daily. Mon/Wed/Fri.'s      . calcium-vitamin D (OSCAL WITH D) 500-200 MG-UNIT per tablet Take 1 tablet by mouth 2 (two) times daily.      . colchicine 0.6 MG tablet Take 0.6 mg by mouth 2 (two) times daily. PRN      . EPINEPHrine (EPIPEN 2-PAK) 0.3 mg/0.3 mL DEVI Inject 0.3 mg into the muscle once.      . folic acid (FOLVITE) 1 MG tablet Take 1 mg by mouth daily.      . furosemide (LASIX) 40 MG tablet Take 40 mg by mouth daily as needed. FOR LEG SWELLING      . ibuprofen (ADVIL,MOTRIN) 200 MG tablet Take 200 mg by mouth every 6 (six) hours as  needed.      . ALPRAZolam (XANAX) 0.25 MG tablet Take 0.25 mg by mouth at bedtime as needed.      . pramipexole (MIRAPEX) 0.125 MG tablet Take 0.125 mg by mouth at bedtime as needed.           Review of Systems:     Cardiac Review of Systems: Y or N  Chest Pain [  n  ]  Resting SOB [n   ] Exertional SOB  [ n ]  Orthopnea [ n ]   Pedal Edema [ n  ]    Palpitations [n  ] Syncope  [n  ]   Presyncope [ n  ]  General Review of Systems: [Y] = yes [  ]=no Constitional: recent weight change [place]ienne  ]; anorexia [ y ]; fatigue [ n ]; nausea [ n ]; night sweats [ n ]; fever [ n ]; or chills [n  ];                                                                                                                                          Dental: poor dentition[  ];   Eye : blurred vision [  ]; diplopia [   ]; vision changes [  ];  Amaurosis fugax[  ]; Resp: cough [ y ];  wheezing[ n ];  hemoptysis[n  ]; shortness of breath[ n ]; paroxysmal nocturnal dyspnea[  n]; dyspnea on exertion[n  ]; or orthopnea[n  ];  GI:  gallstones[  ], vomiting[  ];  dysphagia[  ];  melena[n  ];  hematochezia [n  ]; heartburn[  ];   Hx of  Colonoscopy[  ]; GU: kidney stones [  ]; hematuria[  ];   dysuria [  ];  nocturia[  ];  history of     obstruction [  ];             Skin: rash, swelling[  ];, hair loss[  ];  peripheral edema[  ];  or itching[  ]; Musculosketetal: myalgias[  ];  joint swelling[  ];  joint erythema[  ];  joint pain[ n ];  back pain[n  ];  Heme/Lymph: bruising[n  ];  bleeding[ n ];  anemia[  ];  Neuro: TIA[  ];  headaches[  ];  stroke[n];  vertigo[ n ];  seizures[ n ];   paresthesias[n  ];  difficulty walking[ n ];  Psych:depression[  ]; anxiety[  ];  Endocrine: diabetes[  ];  thyroid dysfunction[  ];  Immunizations: Flu [  ]; Pneumococcal[  ];  Other:  Physical Exam: BP 128/70  Pulse 69  Resp 14  Ht 5\' 3"  (1.6 m)  Wt 111 lb (50.349 kg)  BMI 19.66 kg/m2  SpO2 91%  General appearance: alert, cooperative,  appears stated age and no distress Neurologic: intact Heart: regular rate and rhythm, S1, S2 normal, no murmur, click, rub or gallop Lungs: clear to auscultation bilaterally Abdomen: soft, non-tender; bowel sounds normal; no masses,  no organomegaly Extremities: extremities normal, atraumatic, no cyanosis or edema and Homans sign is negative, no sign of DVT No cervical or supraclavicular adenopathy No carotid bruits   Diagnostic Studies & Laboratory data:     Recent Radiology Findings:   Nm Pet Image Initial (pi) Skull Base To Thigh  01/22/2012  *RADIOLOGY REPORT*  Clinical Data:  Initial treatment strategy for lung carcinoma.  NUCLEAR MEDICINE PET CT INITIAL (PI) SKULL BASE TO THIGH  Technique:  19.9 mCi F-18 FDG was injected intravenously via the right arm.  Full-ring PET imaging was performed from the skull base through the mid-thighs 70  minutes after injection.  CT data was obtained and used for attenuation correction and anatomic localization only.  (This was not acquired as a diagnostic CT examination.)  Fasting Blood Glucose:  102  Patient Weight:  111 pounds.  Comparison:  CT on 01/10/2012 and 01/08/2012  Findings: Irregular nodular opacity in the right lower lobe abutting the right hemidiaphragm shows hypermetabolic activity, with a maximum SUV measuring 4.6.  A 8 mm pulmonary nodule in the posterior right lung base shows no significant hypermetabolic activity.  A 6 mm nodular density in the posterior right middle lobe abutting the major fissure also shows no hypermetabolic activity.  Both of these are below PET size threshold for detection of malignancy.  A 8 mm nodular density in the posterior right upper lobe also abutting the major fissure shows mild hypermetabolic activity, with maximum SUV measuring 1.6.  Asymmetric hypermetabolic activity is seen in the right hilum, with maximum SUV measuring 3.9.  No hypermetabolic mediastinal lymphadenopathy is identified.  No hypermetabolic masses  or lymphadenopathy identified within the neck, abdomen, or pelvis.  IMPRESSION:  1.  Irregular nodular opacity in the right lower lobe abutting the diaphragm is hypermetabolic, with maximum SUV of 4.6.  Bronchogenic carcinoma cannot be excluded. 2.  8 mm nodule in the posterior right upper lobe with low grade hypermetabolic activity.  Differential diagnosis includes carcinoma and inflammatory or infectious etiologies. 3.  Two less than 1 cm pulmonary nodules  in the posterior right middle and lower lobes show no hypermetabolic activity, but are below PET size threshold for reliable detection of malignancy. Continued follow-up by chest CT is recommended. 4.  Mild right hilar hypermetabolic activity, which may be reactive or neoplastic.  No hypermetabolic mediastinal lymph nodes identified.  5.  No evidence of extra-thoracic malignancy.  Original Report Authenticated By: Danae Orleans, M.D.  Ct Abdomen Pelvis Wo Contrast  01/08/2012  *RADIOLOGY REPORT*  Clinical Data: Unexplained weight loss, change in bowel habits  CT ABDOMEN AND PELVIS WITHOUT CONTRAST  Technique:  Multidetector CT imaging of the abdomen and pelvis was performed following the standard protocol without intravenous contrast.  Comparison: None - - - no IV contrast media was given due to abnormal renal laboratory values and GFR of 27.  Findings: There is an abnormal nodular opacity abutting the right hemidiaphragm at the right lung base within the right lower lobes suspicious for lung neoplasm.  On sagittal and coronal images this area measures 2.5 cm in width, 2.6 cm in depth, and 1.1 cm in height.  A 9 mm nodular lesion also is noted more posteriorly within the right lower lobe suspicious for metastasis.  CT of the chest with IV contrast may be warranted to evaluate for mediastinal or hilar adenopathy as well as additional liver lesions.  The liver is unremarkable in the unenhanced state.  No ductal dilatation is seen.  The gallbladder is contracted  with no calcified gallstones.  There is some higher attenuation material layering within the gallbladder representing either gallbladder sludge or noncalcified gallstones.  The pancreas is normal in size and the pancreatic duct is not dilated.  The adrenal glands and spleen are unremarkable.  The stomach is moderately distended with fluid with no abnormality noted.  The kidneys are unremarkable in the unenhanced state.  No renal calculi or mass is seen and no hydronephrosis is noted.  The abdominal aorta is normal in caliber with atheromatous change present.  The urinary bladder is decompressed and cannot be evaluated.  The uterus has been resected previously.  No adnexal lesion is seen. No fluid is noted within the pelvis.  There are multiple rectosigmoid colonic diverticula present.  Diverticula also are scattered throughout the remainder of the colon.  The terminal ileum is unremarkable.  Degenerative changes are present throughout the lumbar spine.  IMPRESSION:  1.  Abnormal nodular lesion at the right lung base suspicious for primary lung carcinoma.  Recommend CT of the chest with IV contrast to assess further.  Probable adjacent metastatic lesion in the right lower lobe as well. 2.  No abdominal or pelvic mass or adenopathy. 3.  Diffuse colonic diverticula.  Original Report Authenticated By: Juline Patch, M.D.   Ct Chest Wo Contrast  01/10/2012  *RADIOLOGY REPORT*  Clinical Data: Left lower lobe lung lesion noted on recent CT of the abdomen pelvis, follow-up, smoking history, weight loss  CT CHEST WITHOUT CONTRAST  Technique:  Multidetector CT imaging of the chest was performed following the standard protocol without IV contrast.  Comparison: CT abdomen pelvis of 01/08/2012  Findings: The irregular nodular lesion abutting the right hemidiaphragm within the right lower lobe at the right lung base is again noted.  This area is somewhat difficult to measure, and on the current study better seen on the  sagittal and coronal images, this area has a a depth of 2.0 cm, height of 1.3 cm, width of approximately 1.8 cm.  There are several small surrounding nodular  lesions within the right lower lobe, as well as a 9-mm nodule posteriorly in the right lower lobe.  In addition there is a right middle lobe nodule of 7 mm and a right upper lobe lesion abutting the fissure measuring 7 mm.  The dominant lesion abutting the hemidiaphragm at the right lung base is highly suspicious for primary lung carcinoma with apparent ipsilateral lung metastases. No left lung nodules are seen.  No pleural effusion is noted.  PET- CT may be helpful in assessing metabolic activity of this lesion and possible metastases.  On soft tissue window images, the thyroid gland is unremarkable. On this unenhanced study, no mediastinal or hilar adenopathy is seen with a few small precarinal nodes present.  No axillary adenopathy is noted.  There are degenerative changes diffusely throughout the thoracic spine.  IMPRESSION:  1.  Irregularly marginated lesion abutting the right hemidiaphragm within the right lower lobe highly suspicious for primary lung carcinoma. 2.  Small nodular opacities within the right lower lobe, right middle lobe, and right upper lobe suspicious for ipsilateral metastases.  Original Report Authenticated By: Juline Patch, M.D.   Recent Lab Findings: Lab Results  Component Value Date   WBC 6.0 04/04/2008   HGB 12.0 04/04/2008   HCT 33.6* 04/04/2008   PLT 210 04/04/2008   GLUCOSE 106* 04/04/2008   ALT 57* 04/04/2008   AST 38* 04/04/2008   NA 138 04/04/2008   K 3.5 04/04/2008   CL 99 04/04/2008   CREATININE 1.96* 04/04/2008   BUN 48* 04/04/2008   CO2 27 04/04/2008   PFTS's:FEV1 2.43  134% DLCO            14   58%  Assessment / Plan:     Patient is an 78 year old female with history of smoking in the past who presents with incidental finding of 3 separate lesions in the right lung, one complex and on the diaphragm in 2 separate 7-9 mm  lesions. She also has chronic renal insufficiency with the creatinine ranging between 1.96 and 1.71. The irregularly shaped lesion on the diaphragm has an SUV of 4.6 suspicious for carcinoma of the lung. There is also some increased uptake in the right hilum but without any matching enlarged lymph node. To further stage and obtain a tissue diagnosis I recommend to the patient that we proceed with bronchoscopy,  EBUS, and ENB.  The goals risks and alternatives of the planned surgical procedure Bronch, EBUS ENB  have been discussed with the patient in detail. The risks of the procedure including death, infection, stroke, myocardial infarction, bleeding, blood transfusion have all been discussed specifically.  I have quoted April Burns a 1 % of perioperative mortality and a complication rate as high as 5 %. The patient's questions have been answered.April Burns is willing  to proceed with the planned procedure.       Delight Ovens MD  Beeper [phone] Office [phone] 01/24/2012 5:50 PM

## 2012-01-25 ENCOUNTER — Encounter (HOSPITAL_COMMUNITY): Payer: Self-pay | Admitting: Respiratory Therapy

## 2012-01-29 ENCOUNTER — Encounter (HOSPITAL_COMMUNITY): Payer: Self-pay

## 2012-01-29 ENCOUNTER — Encounter (HOSPITAL_COMMUNITY)
Admission: RE | Admit: 2012-01-29 | Discharge: 2012-01-29 | Disposition: A | Discharge status: Home / Self Care | Payer: Medicare Other | Source: Ambulatory Visit | Attending: Cardiothoracic Surgery | Admitting: Cardiothoracic Surgery

## 2012-01-29 DIAGNOSIS — Z01818 Encounter for other preprocedural examination: Secondary | ICD-10-CM | POA: Diagnosis not present

## 2012-01-29 DIAGNOSIS — M109 Gout, unspecified: Secondary | ICD-10-CM | POA: Diagnosis not present

## 2012-01-29 DIAGNOSIS — R634 Abnormal weight loss: Secondary | ICD-10-CM | POA: Diagnosis not present

## 2012-01-29 DIAGNOSIS — I129 Hypertensive chronic kidney disease with stage 1 through stage 4 chronic kidney disease, or unspecified chronic kidney disease: Secondary | ICD-10-CM | POA: Diagnosis not present

## 2012-01-29 DIAGNOSIS — K5730 Diverticulosis of large intestine without perforation or abscess without bleeding: Secondary | ICD-10-CM | POA: Diagnosis not present

## 2012-01-29 DIAGNOSIS — J984 Other disorders of lung: Secondary | ICD-10-CM | POA: Diagnosis not present

## 2012-01-29 DIAGNOSIS — D381 Neoplasm of uncertain behavior of trachea, bronchus and lung: Secondary | ICD-10-CM

## 2012-01-29 DIAGNOSIS — E7800 Pure hypercholesterolemia, unspecified: Secondary | ICD-10-CM | POA: Diagnosis not present

## 2012-01-29 DIAGNOSIS — H9190 Unspecified hearing loss, unspecified ear: Secondary | ICD-10-CM | POA: Diagnosis not present

## 2012-01-29 DIAGNOSIS — G2581 Restless legs syndrome: Secondary | ICD-10-CM | POA: Diagnosis not present

## 2012-01-29 DIAGNOSIS — M899 Disorder of bone, unspecified: Secondary | ICD-10-CM | POA: Diagnosis not present

## 2012-01-29 DIAGNOSIS — J449 Chronic obstructive pulmonary disease, unspecified: Secondary | ICD-10-CM | POA: Diagnosis not present

## 2012-01-29 DIAGNOSIS — Z01812 Encounter for preprocedural laboratory examination: Secondary | ICD-10-CM | POA: Diagnosis not present

## 2012-01-29 DIAGNOSIS — J986 Disorders of diaphragm: Secondary | ICD-10-CM | POA: Diagnosis not present

## 2012-01-29 DIAGNOSIS — N189 Chronic kidney disease, unspecified: Secondary | ICD-10-CM | POA: Diagnosis not present

## 2012-01-29 DIAGNOSIS — Z01811 Encounter for preprocedural respiratory examination: Secondary | ICD-10-CM | POA: Diagnosis not present

## 2012-01-29 HISTORY — DX: Unspecified osteoarthritis, unspecified site: M19.90

## 2012-01-29 HISTORY — DX: Other nonspecific abnormal finding of lung field: R91.8

## 2012-01-29 LAB — CBC
HCT: 35.1 % — ABNORMAL LOW (ref 36.0–46.0)
Hemoglobin: 11.6 g/dL — ABNORMAL LOW (ref 12.0–15.0)
MCH: 33.4 pg (ref 26.0–34.0)
MCHC: 33 g/dL (ref 30.0–36.0)
MCV: 101.2 fL — ABNORMAL HIGH (ref 78.0–100.0)
Platelets: 279 10*3/uL (ref 150–400)
RBC: 3.47 MIL/uL — ABNORMAL LOW (ref 3.87–5.11)
RDW: 13.3 % (ref 11.5–15.5)
WBC: 4.9 10*3/uL (ref 4.0–10.5)

## 2012-01-29 LAB — COMPREHENSIVE METABOLIC PANEL
ALT: 13 U/L (ref 0–35)
AST: 18 U/L (ref 0–37)
Albumin: 3.7 g/dL (ref 3.5–5.2)
Alkaline Phosphatase: 71 U/L (ref 39–117)
BUN: 40 mg/dL — ABNORMAL HIGH (ref 6–23)
CO2: 28 mEq/L (ref 19–32)
Calcium: 9.7 mg/dL (ref 8.4–10.5)
Chloride: 99 mEq/L (ref 96–112)
Creatinine, Ser: 1.47 mg/dL — ABNORMAL HIGH (ref 0.50–1.10)
GFR calc Af Amer: 37 mL/min — ABNORMAL LOW (ref >90)
GFR calc non Af Amer: 32 mL/min — ABNORMAL LOW (ref >90)
Glucose, Bld: 89 mg/dL (ref 70–99)
Potassium: 3.7 mEq/L (ref 3.5–5.1)
Sodium: 139 mEq/L (ref 135–145)
Total Bilirubin: 0.5 mg/dL (ref 0.3–1.2)
Total Protein: 7.3 g/dL (ref 6.0–8.3)

## 2012-01-29 LAB — PROTIME-INR
INR: 1.03 (ref 0.00–1.49)
Prothrombin Time: 13.7 seconds (ref 11.6–15.2)

## 2012-01-29 LAB — APTT: aPTT: 32 seconds (ref 24–37)

## 2012-01-29 NOTE — Pre-Procedure Instructions (Signed)
20 April Burns  01/29/2012   Your procedure is scheduled on:  Wed, Jan 30  Report to [facility] at 0630 AM.  Call this number if you have problems the morning of surgery: [phone]   Remember:   Do not eat food:After Midnight.  May have clear liquids: up to 4 Hours before arrival.  Clear liquids include soda, tea, black coffee, apple or grape juice, broth.  Take these medicines the morning of surgery with A SIP OF WATER: *none**   Do not wear jewelry, make-up or nail polish.  Do not wear lotions, powders, or perfumes. You may wear deodorant.  Do not shave 48 hours prior to surgery.  Do not bring valuables to the hospital.  Contacts, dentures or bridgework may not be worn into surgery.  Leave suitcase in the car. After surgery it may be brought to your room.  For patients admitted to the hospital, checkout time is 11:00 AM the day of discharge.   Patients discharged the day of surgery will not be allowed to drive home.  Name and phone number of your driver: **daughter, April Burns*  Special Instructions: CHG Shower Use Special Wash: 1/2 bottle night before surgery and 1/2 bottle morning of surgery.   Please read over the following fact sheets that you were given: Pain Booklet, Coughing and Deep Breathing and Surgical Site Infection Prevention

## 2012-01-31 NOTE — Preoperative (Signed)
Beta Blockers   Reason not to administer Beta Blockers:Not Applicable 

## 2012-02-01 ENCOUNTER — Encounter (HOSPITAL_COMMUNITY): Payer: Self-pay | Admitting: Anesthesiology

## 2012-02-01 ENCOUNTER — Ambulatory Visit (HOSPITAL_COMMUNITY): Payer: Medicare Other | Admitting: Anesthesiology

## 2012-02-01 ENCOUNTER — Other Ambulatory Visit: Payer: Self-pay

## 2012-02-01 ENCOUNTER — Ambulatory Visit (HOSPITAL_COMMUNITY): Payer: Medicare Other

## 2012-02-01 ENCOUNTER — Other Ambulatory Visit: Payer: Self-pay | Admitting: Cardiothoracic Surgery

## 2012-02-01 ENCOUNTER — Encounter (HOSPITAL_COMMUNITY): Payer: Self-pay | Admitting: *Deleted

## 2012-02-01 ENCOUNTER — Encounter (HOSPITAL_COMMUNITY)
Admission: RE | Disposition: A | Discharge status: Home / Self Care | Payer: Self-pay | Source: Ambulatory Visit | Attending: Cardiothoracic Surgery

## 2012-02-01 ENCOUNTER — Ambulatory Visit (HOSPITAL_COMMUNITY)
Admission: RE | Admit: 2012-02-01 | Discharge: 2012-02-01 | Disposition: A | Discharge status: Home / Self Care | Payer: Medicare Other | Source: Ambulatory Visit | Attending: Cardiothoracic Surgery | Admitting: Cardiothoracic Surgery

## 2012-02-01 DIAGNOSIS — N189 Chronic kidney disease, unspecified: Secondary | ICD-10-CM | POA: Insufficient documentation

## 2012-02-01 DIAGNOSIS — J984 Other disorders of lung: Secondary | ICD-10-CM | POA: Insufficient documentation

## 2012-02-01 DIAGNOSIS — Z01818 Encounter for other preprocedural examination: Secondary | ICD-10-CM | POA: Insufficient documentation

## 2012-02-01 DIAGNOSIS — K5730 Diverticulosis of large intestine without perforation or abscess without bleeding: Secondary | ICD-10-CM | POA: Insufficient documentation

## 2012-02-01 DIAGNOSIS — Z01812 Encounter for preprocedural laboratory examination: Secondary | ICD-10-CM | POA: Insufficient documentation

## 2012-02-01 DIAGNOSIS — R222 Localized swelling, mass and lump, trunk: Secondary | ICD-10-CM | POA: Diagnosis not present

## 2012-02-01 DIAGNOSIS — D381 Neoplasm of uncertain behavior of trachea, bronchus and lung: Secondary | ICD-10-CM

## 2012-02-01 DIAGNOSIS — G2581 Restless legs syndrome: Secondary | ICD-10-CM | POA: Insufficient documentation

## 2012-02-01 DIAGNOSIS — J9819 Other pulmonary collapse: Secondary | ICD-10-CM | POA: Diagnosis not present

## 2012-02-01 DIAGNOSIS — E7800 Pure hypercholesterolemia, unspecified: Secondary | ICD-10-CM | POA: Insufficient documentation

## 2012-02-01 DIAGNOSIS — I129 Hypertensive chronic kidney disease with stage 1 through stage 4 chronic kidney disease, or unspecified chronic kidney disease: Secondary | ICD-10-CM | POA: Insufficient documentation

## 2012-02-01 DIAGNOSIS — M949 Disorder of cartilage, unspecified: Secondary | ICD-10-CM | POA: Insufficient documentation

## 2012-02-01 DIAGNOSIS — J986 Disorders of diaphragm: Secondary | ICD-10-CM | POA: Insufficient documentation

## 2012-02-01 DIAGNOSIS — R918 Other nonspecific abnormal finding of lung field: Secondary | ICD-10-CM | POA: Diagnosis not present

## 2012-02-01 DIAGNOSIS — M109 Gout, unspecified: Secondary | ICD-10-CM | POA: Insufficient documentation

## 2012-02-01 DIAGNOSIS — H9190 Unspecified hearing loss, unspecified ear: Secondary | ICD-10-CM | POA: Insufficient documentation

## 2012-02-01 DIAGNOSIS — M899 Disorder of bone, unspecified: Secondary | ICD-10-CM | POA: Insufficient documentation

## 2012-02-01 DIAGNOSIS — I10 Essential (primary) hypertension: Secondary | ICD-10-CM | POA: Diagnosis not present

## 2012-02-01 DIAGNOSIS — R634 Abnormal weight loss: Secondary | ICD-10-CM | POA: Insufficient documentation

## 2012-02-01 HISTORY — PX: VIDEO BRONCHOSCOPY: SHX5072

## 2012-02-01 SURGERY — VIDEO BRONCHOSCOPY WITH ENDOBRONCHIAL NAVIGATION
Anesthesia: General | Site: Chest | Wound class: Clean Contaminated

## 2012-02-01 MED ORDER — ROCURONIUM BROMIDE 100 MG/10ML IV SOLN
INTRAVENOUS | Status: DC | PRN
  Administered 2012-02-01: 35 mg via INTRAVENOUS

## 2012-02-01 MED ORDER — PROPOFOL 10 MG/ML IV EMUL
INTRAVENOUS | Status: DC | PRN
  Administered 2012-02-01: 50 mg via INTRAVENOUS
  Administered 2012-02-01: 30 mg via INTRAVENOUS
  Administered 2012-02-01: 150 mg via INTRAVENOUS

## 2012-02-01 MED ORDER — MORPHINE SULFATE 2 MG/ML IJ SOLN: 2.5000 mg | INTRAMUSCULAR | Status: DC | PRN

## 2012-02-01 MED ORDER — MEPERIDINE HCL 25 MG/ML IJ SOLN: 6.2500 mg | INTRAMUSCULAR | Status: DC | PRN

## 2012-02-01 MED ORDER — LACTATED RINGERS IV SOLN
INTRAVENOUS | Status: DC | PRN
  Administered 2012-02-01 (×2): via INTRAVENOUS

## 2012-02-01 MED ORDER — NEOSTIGMINE METHYLSULFATE 1 MG/ML IJ SOLN
INTRAMUSCULAR | Status: DC | PRN
  Administered 2012-02-01: 3 mg via INTRAVENOUS

## 2012-02-01 MED ORDER — FENTANYL CITRATE 0.05 MG/ML IJ SOLN
INTRAMUSCULAR | Status: DC | PRN
  Administered 2012-02-01: 100 ug via INTRAVENOUS
  Administered 2012-02-01: 25 ug via INTRAVENOUS

## 2012-02-01 MED ORDER — HYDROMORPHONE HCL PF 1 MG/ML IJ SOLN: 0.2500 mg | INTRAMUSCULAR | Status: DC | PRN

## 2012-02-01 MED ORDER — ONDANSETRON HCL 4 MG/2ML IJ SOLN
INTRAMUSCULAR | Status: DC | PRN
  Administered 2012-02-01: 4 mg via INTRAVENOUS

## 2012-02-01 MED ORDER — ONDANSETRON HCL 4 MG/2ML IJ SOLN: 4.0000 mg | Freq: Once | INTRAMUSCULAR | Status: DC | PRN

## 2012-02-01 MED ORDER — GLYCOPYRROLATE 0.2 MG/ML IJ SOLN
INTRAMUSCULAR | Status: DC | PRN
  Administered 2012-02-01: .4 mg via INTRAVENOUS

## 2012-02-01 MED ORDER — EPHEDRINE SULFATE 50 MG/ML IJ SOLN
INTRAMUSCULAR | Status: DC | PRN
  Administered 2012-02-01 (×4): 5 mg via INTRAVENOUS
  Administered 2012-02-01: 10 mg via INTRAVENOUS
  Administered 2012-02-01: 5 mg via INTRAVENOUS
  Administered 2012-02-01: 10 mg via INTRAVENOUS
  Administered 2012-02-01: 5 mg via INTRAVENOUS
  Administered 2012-02-01: 10 mg via INTRAVENOUS
  Administered 2012-02-01: 15 mg via INTRAVENOUS
  Administered 2012-02-01: 5 mg via INTRAVENOUS

## 2012-02-01 SURGICAL SUPPLY — 43 items
BALL CTTN LRG ABS STRL LF (GAUZE/BANDAGES/DRESSINGS)
BRUSH CYTOL CELLEBRITY 1.5X140 (MISCELLANEOUS) ×2 IMPLANT
BRUSH SUPERTRAX BIOPSY (INSTRUMENTS) ×2 IMPLANT
BRUSH SUPERTRAX NDL-TIP CYTO (INSTRUMENTS) IMPLANT
CANISTER SUCTION 2500CC (MISCELLANEOUS) ×2 IMPLANT
CHANNEL WORK EXTEND EDGE 180 (KITS) IMPLANT
CHANNEL WORK EXTEND EDGE 45 (KITS) ×2 IMPLANT
CHANNEL WORK EXTEND EDGE 90 (KITS) ×2 IMPLANT
CLOTH BEACON ORANGE TIMEOUT ST (SAFETY) ×2 IMPLANT
CONT SPEC 4OZ CLIKSEAL STRL BL (MISCELLANEOUS) ×4 IMPLANT
COTTONBALL LRG STERILE PKG (GAUZE/BANDAGES/DRESSINGS) IMPLANT
COVER TABLE BACK 60X90 (DRAPES) ×2 IMPLANT
FILTER STRAW FLUID ASPIR (MISCELLANEOUS) IMPLANT
FORCEPS BIOP RJ4 1.8 (CUTTING FORCEPS) IMPLANT
FORCEPS BIOP SUPERTRX PREMAR (INSTRUMENTS) ×2 IMPLANT
GLOVE BIO SURGEON STRL SZ 6.5 (GLOVE) ×2 IMPLANT
KIT LOCATABLE GUIDE (CANNULA) ×2 IMPLANT
KIT MARKER FIDUCIAL DELIVERY (KITS) IMPLANT
KIT PROCEDURE EDGE 180 (KITS) IMPLANT
KIT PROCEDURE EDGE 45 (KITS) IMPLANT
KIT PROCEDURE EDGE 90 (KITS) ×2 IMPLANT
KIT ROOM TURNOVER OR (KITS) ×2 IMPLANT
MARKER SKIN DUAL TIP RULER LAB (MISCELLANEOUS) ×2 IMPLANT
NEEDLE 22X1 1/2 (OR ONLY) (NEEDLE) IMPLANT
NEEDLE BIOPSY TRANSBRONCH 21G (NEEDLE) IMPLANT
NEEDLE BLUNT 18X1 FOR OR ONLY (NEEDLE) IMPLANT
NEEDLE SUPERTRX PREMARK BIOPSY (NEEDLE) ×2 IMPLANT
NEEDLE SYS SONOTIP II EBUSTBNA (NEEDLE) ×2 IMPLANT
NS IRRIG 1000ML POUR BTL (IV SOLUTION) ×2 IMPLANT
OIL SILICONE PENTAX (PARTS (SERVICE/REPAIRS)) ×2 IMPLANT
PAD ARMBOARD 7.5X6 YLW CONV (MISCELLANEOUS) ×4 IMPLANT
PATCHES PATIENT (LABEL) ×2 IMPLANT
SPONGE GAUZE 4X4 12PLY (GAUZE/BANDAGES/DRESSINGS) ×2 IMPLANT
SYR 20ML ECCENTRIC (SYRINGE) ×2 IMPLANT
SYR 30ML LL (SYRINGE) IMPLANT
SYR 5ML LUER SLIP (SYRINGE) ×2 IMPLANT
SYR CONTROL 10ML LL (SYRINGE) IMPLANT
TOWEL OR 17X24 6PK STRL BLUE (TOWEL DISPOSABLE) ×2 IMPLANT
TRAP SPECIMEN MUCOUS 40CC (MISCELLANEOUS) ×4 IMPLANT
TUBE CONNECTING 12X1/4 (SUCTIONS) ×2 IMPLANT
VALVE BIOPSY  SINGLE USE (MISCELLANEOUS)
VALVE BIOPSY SINGLE USE (MISCELLANEOUS) IMPLANT
VALVE SUCTION BRONCHIO DISP (MISCELLANEOUS) IMPLANT

## 2012-02-01 NOTE — Brief Op Note (Signed)
02/01/2012  10:22 AM  PATIENT:  April Burns  78 y.o. female  PRE-OPERATIVE DIAGNOSIS:  right lower lobe LUNG LESION  POST-OPERATIVE DIAGNOSIS:  right lower lobe LUNG LESION  PROCEDURE:  Procedure(s): VIDEO BRONCHOSCOPY WITH ENDOBRONCHIAL NAVIGATION VIDEO BRONCHOSCOPY WITH ENDOBRONCHIAL ULTRASOUND VIDEO BRONCHOSCOPY  SURGEON:  Surgeon(s): Delight Ovens, MD      ANESTHESIA:   general  EBL:  Total I/O In: 1000 [I.V.:1000] Out: -   BLOOD ADMINISTERED:none  DRAINS: none   LOCAL MEDICATIONS USED:  NONE  SPECIMEN:  Fine Needle Aspirate  DISPOSITION OF SPECIMEN:  PATHOLOGY  COUNTS:  YES   DICTATION: .Other Dictation: Dictation Number   PLAN OF CARE: Discharge to home after PACU  PATIENT DISPOSITION:  PACU - hemodynamically stable.   Delay start of Pharmacological VTE agent (>24hrs) due to surgical blood loss or risk of bleeding: yes

## 2012-02-01 NOTE — Anesthesia Postprocedure Evaluation (Signed)
  Anesthesia Post-op Note  Patient: April Burns  Procedure(s) Performed:  VIDEO BRONCHOSCOPY WITH ENDOBRONCHIAL NAVIGATION; VIDEO BRONCHOSCOPY WITH ENDOBRONCHIAL ULTRASOUND; VIDEO BRONCHOSCOPY  Patient Location: PACU  Anesthesia Type: General  Level of Consciousness: awake, alert  and oriented  Airway and Oxygen Therapy: Patient Spontanous Breathing and Patient connected to nasal cannula oxygen  Post-op Pain: none  Post-op Assessment: Post-op Vital signs reviewed, Patient's Cardiovascular Status Stable, Respiratory Function Stable, Patent Airway, No signs of Nausea or vomiting and Pain level controlled  Post-op Vital Signs: Reviewed and stable  Complications: No apparent anesthesia complications

## 2012-02-01 NOTE — Transfer of Care (Signed)
Immediate Anesthesia Transfer of Care Note  Patient: April Burns  Procedure(s) Performed:  VIDEO BRONCHOSCOPY WITH ENDOBRONCHIAL NAVIGATION; VIDEO BRONCHOSCOPY WITH ENDOBRONCHIAL ULTRASOUND; VIDEO BRONCHOSCOPY  Patient Location: PACU  Anesthesia Type: General  Level of Consciousness: awake, alert  and oriented  Airway & Oxygen Therapy: Patient Spontanous Breathing and Patient connected to face mask oxygen  Post-op Assessment: Report given to PACU RN  Post vital signs: Reviewed and stable  Complications: No apparent anesthesia complications

## 2012-02-01 NOTE — Anesthesia Preprocedure Evaluation (Addendum)
Anesthesia Evaluation  Patient identified by MRN, date of birth, ID band Patient awake    Reviewed: Allergy & Precautions, H&P , NPO status , Patient's Chart, lab work & pertinent test results  Airway Mallampati: I TM Distance: >3 FB Neck ROM: Full    Dental  (+) Teeth Intact and Dental Advisory Given   Pulmonary  clear to auscultation        Cardiovascular hypertension, Pt. on medications Regular Normal    Neuro/Psych    GI/Hepatic   Endo/Other    Renal/GU      Musculoskeletal   Abdominal   Peds  Hematology   Anesthesia Other Findings   Reproductive/Obstetrics                          Anesthesia Physical Anesthesia Plan  ASA: III  Anesthesia Plan: General   Post-op Pain Management:    Induction: Intravenous  Airway Management Planned: Oral ETT  Additional Equipment:   Intra-op Plan:   Post-operative Plan: Extubation in OR  Informed Consent: I have reviewed the patients History and Physical, chart, labs and discussed the procedure including the risks, benefits and alternatives for the proposed anesthesia with the patient or authorized representative who has indicated his/her understanding and acceptance.   Dental advisory given  Plan Discussed with: CRNA, Anesthesiologist and Surgeon  Anesthesia Plan Comments:         Anesthesia Quick Evaluation

## 2012-02-01 NOTE — Progress Notes (Signed)
Dr. Tyrone Sage notified of CXR result. He has viewed the x-ray and has no concerns. Pt. May be d/c'd home.

## 2012-02-01 NOTE — H&P (Signed)
[address]            [place] [address]          [id number]        April Burns [facility] Medical Record #[id number] Date of Birth: 09-27-1927  Referring: [name], MD Primary Care: [name], MD  Chief Complaint:    Lung Mass  History of Present Illness:    Patient is a 78 year old female with a remote smoking history who presents with a 12 pound weight loss over the past 6 months. She's had a dry cough no hemoptysis. She denies fever chills or night sweats. She's had no change in bowel habits. Because of these symptoms she saw Dr. Kevan Ny and a CT scan of the abdomen was performed. 2 right lower lobe lung masses were detected and a followup CT scan of the chest was performed.    She denies chest pain or shortness of breath. She denies any bone pain. PFT's and PET Scan   Current Activity/ Functional Status: Patient is independent with mobility/ambulation, transfers, ADL's, IADL's.   Past Medical History  Diagnosis Date  . Hypercholesterolemia   . Gout   . Allergic rhinitis   . Restless leg syndrome   . HTN (hypertension)   . Diverticulosis   . Hearing loss   . Osteopenia   . Cancer   . Lung mass   . Arthritis     gout    Past Surgical History  Procedure Date  . C section x2   . Tah ovaries intact   . Rt leg vein strippin for varicosities   . Cataract extractions bilateral 2010/2011  . [place]   . Eye surgery   . Abdominal hysterectomy     Family History  Problem Relation Age of Onset  . Heart disease Father   . Alcohol abuse Brother     History   Social History  . Marital Status: Single    Spouse Name: N/A    Number of Children: N/A  . Years of Education: N/A   Occupational History  . Not on file.   Social History Main Topics  . Smoking status: Former Smoker -- 1.0 packs/day for 50 years    Types: Cigarettes    Quit date: 12/31/1993  . Smokeless tobacco: Never Used  . Alcohol  Use: 5.0 oz/week    0 Glasses of wine, 0 Cans of beer, 0 Shots of liquor, 10 Drinks containing 0.5 oz of alcohol per week  . Drug Use: No  . Sexually Active: Not on file   Other Topics Concern  . 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 Ann Maki- in-Law Deborra Medina died suddenly of MI at age 85 yo.     History  Smoking status  . Former Smoker -- 1.0 packs/day for 50 years  . Types: Cigarettes  . Quit date: 12/31/1993  Smokeless tobacco  . Never Used    History  Alcohol Use  . 5.0 oz/week  . 0 Glasses of wine, 0 Cans of beer, 0 Shots of liquor, 10 Drinks containing 0.5 oz of alcohol per week     Allergies  Allergen Reactions  . Neosporin (Neomycin-Polymyx-Gramicid) Dermatitis  . Statins Other (See Comments)    Myalgias and unsteady gait  No current facility-administered medications for this encounter.   Facility-Administered Medications Ordered in Other Encounters  Medication Dose Route Frequency Provider Last Rate Last Dose  . lactated ringers infusion    Continuous PRN [employer], CRNA           Review of Systems:     Cardiac Review of Systems: Y or N  Chest Pain [  n  ]  Resting SOB [n   ] Exertional SOB  [ n ]  Orthopnea [ n ]   Pedal Edema [ n  ]    Palpitations [n  ] Syncope  [n  ]   Presyncope [ n  ]  General Review of Systems: [Y] = yes [  ]=no Constitional: recent weight change Cove.Etienne  ]; anorexia [ y ]; fatigue [ n ]; nausea [ n ]; night sweats [ n ]; fever [ n ]; or chills [n  ];                                                                                                                                          Dental: poor dentition[  ];   Eye : blurred vision [  ]; diplopia [   ]; vision changes [  ];  Amaurosis fugax[  ]; Resp: cough [ y ];  wheezing[ n ];  hemoptysis[n  ]; shortness of breath[ n ]; paroxysmal nocturnal dyspnea[  n];  dyspnea on exertion[n  ]; or orthopnea[n  ];  GI:  gallstones[  ], vomiting[  ];  dysphagia[  ]; melena[n  ];  hematochezia [n  ]; heartburn[  ];   Hx of  Colonoscopy[  ]; GU: kidney stones [  ]; hematuria[  ];   dysuria [  ];  nocturia[  ];  history of     obstruction [  ];             Skin: rash, swelling[  ];, hair loss[  ];  peripheral edema[  ];  or itching[  ]; Musculosketetal: myalgias[  ];  joint swelling[  ];  joint erythema[  ];  joint pain[ n ];  back pain[n  ];  Heme/Lymph: bruising[n  ];  bleeding[ n ];  anemia[  ];  Neuro: TIA[  ];  headaches[  ];  stroke[n];  vertigo[ n ];  seizures[ n ];   paresthesias[n  ];  difficulty walking[ n ];  Psych:depression[  ]; anxiety[  ];  Endocrine: diabetes[  ];  thyroid dysfunction[  ];  Immunizations: Flu [ ? ]; Pneumococcal[ ? ];  Other:  Physical Exam: BP 155/68  Pulse 68  Temp 97.8 F (36.6 C)  Resp 16  SpO2 100%  General appearance: alert, cooperative, appears stated age and no distress Neurologic: intact Heart: regular rate and rhythm, S1, S2 normal, no murmur, click, rub or gallop Lungs: clear to auscultation bilaterally Abdomen: soft,  non-tender; bowel sounds normal; no masses,  no organomegaly Extremities: extremities normal, atraumatic, no cyanosis or edema and Homans sign is negative, no sign of DVT No cervical or supraclavicular adenopathy No carotid bruits   Diagnostic Studies & Laboratory data:     Recent Radiology Findings:   Nm Pet Image Initial (pi) Skull Base To Thigh  01/22/2012  *RADIOLOGY REPORT*  Clinical Data:  Initial treatment strategy for lung carcinoma.  NUCLEAR MEDICINE PET CT INITIAL (PI) SKULL BASE TO THIGH  Technique:  19.9 mCi F-18 FDG was injected intravenously via the right arm.  Full-ring PET imaging was performed from the skull base through the mid-thighs 70  minutes after injection.  CT data was obtained and used for attenuation correction and anatomic localization only.  (This was not acquired  as a diagnostic CT examination.)  Fasting Blood Glucose:  102  Patient Weight:  111 pounds.  Comparison:  CT on 01/10/2012 and 01/08/2012  Findings: Irregular nodular opacity in the right lower lobe abutting the right hemidiaphragm shows hypermetabolic activity, with a maximum SUV measuring 4.6.  A 8 mm pulmonary nodule in the posterior right lung base shows no significant hypermetabolic activity.  A 6 mm nodular density in the posterior right middle lobe abutting the major fissure also shows no hypermetabolic activity.  Both of these are below PET size threshold for detection of malignancy.  A 8 mm nodular density in the posterior right upper lobe also abutting the major fissure shows mild hypermetabolic activity, with maximum SUV measuring 1.6.  Asymmetric hypermetabolic activity is seen in the right hilum, with maximum SUV measuring 3.9.  No hypermetabolic mediastinal lymphadenopathy is identified.  No hypermetabolic masses or lymphadenopathy identified within the neck, abdomen, or pelvis.  IMPRESSION:  1.  Irregular nodular opacity in the right lower lobe abutting the diaphragm is hypermetabolic, with maximum SUV of 4.6.  Bronchogenic carcinoma cannot be excluded. 2.  8 mm nodule in the posterior right upper lobe with low grade hypermetabolic activity.  Differential diagnosis includes carcinoma and inflammatory or infectious etiologies. 3.  Two less than 1 cm pulmonary nodules in the posterior right middle and lower lobes show no hypermetabolic activity, but are below PET size threshold for reliable detection of malignancy. Continued follow-up by chest CT is recommended. 4.  Mild right hilar hypermetabolic activity, which may be reactive or neoplastic.  No hypermetabolic mediastinal lymph nodes identified.  5.  No evidence of extra-thoracic malignancy.  Original Report Authenticated By: Danae Orleans, M.D.  Ct Abdomen Pelvis Wo Contrast  01/08/2012  *RADIOLOGY REPORT*  Clinical Data: Unexplained weight loss,  change in bowel habits  CT ABDOMEN AND PELVIS WITHOUT CONTRAST  Technique:  Multidetector CT imaging of the abdomen and pelvis was performed following the standard protocol without intravenous contrast.  Comparison: None - - - no IV contrast media was given due to abnormal renal laboratory values and GFR of 27.  Findings: There is an abnormal nodular opacity abutting the right hemidiaphragm at the right lung base within the right lower lobes suspicious for lung neoplasm.  On sagittal and coronal images this area measures 2.5 cm in width, 2.6 cm in depth, and 1.1 cm in height.  A 9 mm nodular lesion also is noted more posteriorly within the right lower lobe suspicious for metastasis.  CT of the chest with IV contrast may be warranted to evaluate for mediastinal or hilar adenopathy as well as additional liver lesions.  The liver is unremarkable in the unenhanced state.  No ductal dilatation is seen.  The gallbladder is contracted with no calcified gallstones.  There is some higher attenuation material layering within the gallbladder representing either gallbladder sludge or noncalcified gallstones.  The pancreas is normal in size and the pancreatic duct is not dilated.  The adrenal glands and spleen are unremarkable.  The stomach is moderately distended with fluid with no abnormality noted.  The kidneys are unremarkable in the unenhanced state.  No renal calculi or mass is seen and no hydronephrosis is noted.  The abdominal aorta is normal in caliber with atheromatous change present.  The urinary bladder is decompressed and cannot be evaluated.  The uterus has been resected previously.  No adnexal lesion is seen. No fluid is noted within the pelvis.  There are multiple rectosigmoid colonic diverticula present.  Diverticula also are scattered throughout the remainder of the colon.  The terminal ileum is unremarkable.  Degenerative changes are present throughout the lumbar spine.  IMPRESSION:  1.  Abnormal nodular  lesion at the right lung base suspicious for primary lung carcinoma.  Recommend CT of the chest with IV contrast to assess further.  Probable adjacent metastatic lesion in the right lower lobe as well. 2.  No abdominal or pelvic mass or adenopathy. 3.  Diffuse colonic diverticula.  Original Report Authenticated By: Juline Patch, M.D.   Ct Chest Wo Contrast  01/10/2012  *RADIOLOGY REPORT*  Clinical Data: Left lower lobe lung lesion noted on recent CT of the abdomen pelvis, follow-up, smoking history, weight loss  CT CHEST WITHOUT CONTRAST  Technique:  Multidetector CT imaging of the chest was performed following the standard protocol without IV contrast.  Comparison: CT abdomen pelvis of 01/08/2012  Findings: The irregular nodular lesion abutting the right hemidiaphragm within the right lower lobe at the right lung base is again noted.  This area is somewhat difficult to measure, and on the current study better seen on the sagittal and coronal images, this area has a a depth of 2.0 cm, height of 1.3 cm, width of approximately 1.8 cm.  There are several small surrounding nodular lesions within the right lower lobe, as well as a 9-mm nodule posteriorly in the right lower lobe.  In addition there is a right middle lobe nodule of 7 mm and a right upper lobe lesion abutting the fissure measuring 7 mm.  The dominant lesion abutting the hemidiaphragm at the right lung base is highly suspicious for primary lung carcinoma with apparent ipsilateral lung metastases. No left lung nodules are seen.  No pleural effusion is noted.  PET- CT may be helpful in assessing metabolic activity of this lesion and possible metastases.  On soft tissue window images, the thyroid gland is unremarkable. On this unenhanced study, no mediastinal or hilar adenopathy is seen with a few small precarinal nodes present.  No axillary adenopathy is noted.  There are degenerative changes diffusely throughout the thoracic spine.  IMPRESSION:  1.   Irregularly marginated lesion abutting the right hemidiaphragm within the right lower lobe highly suspicious for primary lung carcinoma. 2.  Small nodular opacities within the right lower lobe, right middle lobe, and right upper lobe suspicious for ipsilateral metastases.  Original Report Authenticated By: Juline Patch, M.D.   Recent Lab Findings: Lab Results  Component Value Date   WBC 4.9 01/29/2012   HGB 11.6* 01/29/2012   HCT 35.1* 01/29/2012   PLT 279 01/29/2012   GLUCOSE 89 01/29/2012   ALT 13 01/29/2012   AST 18  01/29/2012   NA 139 01/29/2012   K 3.7 01/29/2012   CL 99 01/29/2012   CREATININE 1.47* 01/29/2012   BUN 40* 01/29/2012   CO2 28 01/29/2012   INR 1.03 01/29/2012   PFTS's:FEV1 2.43  134% DLCO            14   58%  Assessment / Plan:     Patient is an 78 year old female with history of smoking in the past who presents with incidental finding of 3 separate lesions in the right lung, one complex and on the diaphragm in 2 separate 7-9 mm lesions. She also has chronic renal insufficiency with the creatinine ranging between 1.96 and 1.71. The irregularly shaped lesion on the diaphragm has an SUV of 4.6 suspicious for carcinoma of the lung. There is also some increased uptake in the right hilum but without any matching enlarged lymph node. To further stage and obtain a tissue diagnosis I recommend to the patient that we proceed with bronchoscopy,  EBUS, and ENB.  The goals risks and alternatives of the planned surgical procedure Bronch, EBUS ENB  have been discussed with the patient in detail. The risks of the procedure including death, infection, stroke, myocardial infarction, bleeding, blood transfusion have all been discussed specifically.  I have quoted April Burns a 1 % of perioperative mortality and a complication rate as high as 5 %. The patient's questions have been answered.April Burns is willing  to proceed with the planned procedure.   Delight Ovens MD  Beeper  [phone] Office [phone] 02/01/2012 7:10 AM

## 2012-02-03 LAB — CULTURE, RESPIRATORY W GRAM STAIN: Culture: NO GROWTH

## 2012-02-03 NOTE — Op Note (Signed)
NAME:  April Burns, April Burns NO.:  [id number]  MEDICAL RECORD NO.:  [id number]  LOCATION:  [facility]                         FACILITY:  [facility]  PHYSICIAN:  Sheliah Plane, MD    DATE OF BIRTH:  Feb 07, 1927  DATE OF PROCEDURE:  02/01/2012 DATE OF DISCHARGE:  02/01/2012                              OPERATIVE REPORT   PREOPERATIVE DIAGNOSIS:  Right lower lobe lung mass, questionable malignancy.  POSTOPERATIVE DIAGNOSIS:  Right lower lobe lung mass, questionable malignancy.  SURGICAL PROCEDURE:  Bronchoscopy, EBUS with transbronchial biopsy, and electromagnetic navigation bronchoscopy and attempt at biopsy.  SURGEON:  Sheliah Plane, MD  BRIEF HISTORY:  The patient is an 78 year old female who had the incidental finding of a PET-positive right lower lobe lung lesion in the vicinity almost the diaphragm with a previous history of smoking. Because of this, in addition, the patient had some right hilar activity on the PET scan.  An attempt to obtain a staging and tissue diagnosis, bronchoscopy, and ENB was recommend to the patient.  She agreed and signed informed consent.  DESCRIPTION OF PROCEDURE:  The patient underwent general endotracheal anesthesia without incident.  The time-out was performed per protocol. Through the endotracheal tube, fiberoptic bronchoscope was passed to the subsegmental level and both the left and right lungs without evidence of endobronchial lesions.  The scope was then removed and the EBUS scope was placed, guided by the CT scan, and with the EBUS, an area or 10R lymph node which had some increased activity on PET scan, but without any obvious enlarged lymph nodes.  Multiple biopsies were obtained. Area of lymphoid tissue was identified in the 10R area.  The initial cytologies were sent to Pathology.  There was lymphoid tissue present, but no evidence of malignancy.  The scope was removed.  Previous plan for ENB was loaded into the computer.   Registration was carried out. Two different areas of target and two passes were created to attempt the biopsy this.  After multiple attempts down different passes, we were never able to get close enough to feel safe in biopsying the lesion. So, further attempts were stopped.  The scope was removed.  The patient tolerated the procedure without obvious complication.  We will await final pathology on the 10R node.  If this is negative, then proceed with right video-assisted thoracoscopy and direct biopsy and wedge resection versus lobectomy.  The patient was extubated in the operating room, transferred to the recovery room.  Followup chest x-ray will be obtained in the recovery room.     Sheliah Plane, MD    EG/MEDQ  D:  02/03/2012  T:  02/03/2012  Job:  [id number]

## 2012-02-04 ENCOUNTER — Encounter (HOSPITAL_COMMUNITY): Payer: Self-pay | Admitting: Cardiothoracic Surgery

## 2012-02-06 ENCOUNTER — Ambulatory Visit (INDEPENDENT_AMBULATORY_CARE_PROVIDER_SITE_OTHER): Payer: Medicare Other | Admitting: Cardiothoracic Surgery

## 2012-02-06 ENCOUNTER — Encounter: Payer: Self-pay | Admitting: Internal Medicine

## 2012-02-06 ENCOUNTER — Encounter: Payer: Self-pay | Admitting: Cardiothoracic Surgery

## 2012-02-06 VITALS — BP 111/63 | HR 86 | Resp 16 | Ht 63.0 in | Wt 111.0 lb

## 2012-02-06 DIAGNOSIS — J984 Other disorders of lung: Secondary | ICD-10-CM

## 2012-02-06 DIAGNOSIS — Z9889 Other specified postprocedural states: Secondary | ICD-10-CM | POA: Diagnosis not present

## 2012-02-06 NOTE — Progress Notes (Signed)
[address]            [place] [address]          [phone]       TOSHIBA NULL [facility] Medical Record #[id number] Date of Birth: Oct 02, 1927  [name], MD [name], MD, MD  Chief Complaint:   PostOp Follow Up Visit   History of Present Illness:      Patient returns for followup visit after bronchoscopy EBUS, and ENB. The level X R. node showed lymphatic tissue but no evidence of tumor. We're unable to make a tissue diagnosis on the primary lesion. Patient returns today the discuss further treatment options. I recommended to her and her daughter to proceed with video-assisted thoracoscopy and resection of lesion both for diagnosis and treatment.  She has very slight hoarseness after her procedure but otherwise feels well.   History  Smoking status  . Former Smoker -- 1.0 packs/day for 50 years  . Types: Cigarettes  . Quit date: 12/31/1993  Smokeless tobacco  . Never Used       Allergies  Allergen Reactions  . Neosporin (Neomycin-Polymyx-Gramicid) Dermatitis  . Statins Other (See Comments)    Myalgias and unsteady gait    Current Outpatient Prescriptions  Medication Sig Dispense Refill  . acetaminophen (TYLENOL) 325 MG tablet Take 650 mg by mouth every 6 (six) hours as needed. For pain      . allopurinol (ZYLOPRIM) 100 MG tablet Take 100 mg by mouth daily.       Marland Kitchen amLODipine-olmesartan (AZOR) 10-40 MG per tablet Take 0.5 tablets by mouth daily.       Marland Kitchen aspirin 81 MG tablet Take 81 mg by mouth every Monday, Wednesday, and Friday.       . calcium-vitamin D (OSCAL WITH D) 500-200 MG-UNIT per tablet Take 1 tablet by mouth 2 (two) times daily.      . colchicine 0.6 MG tablet Take 0.6 mg by mouth 2 (two) times daily as needed. For gout      . EPINEPHrine (EPIPEN 2-PAK) 0.3 mg/0.3 mL DEVI Inject 0.3 mg into the muscle once.      . fluticasone (FLONASE) 50 MCG/ACT nasal spray Place 2 sprays into the nose  daily.      . folic acid (FOLVITE) 1 MG tablet Take 1 mg by mouth daily.      . furosemide (LASIX) 40 MG tablet Take 40 mg by mouth daily as needed. FOR LEG SWELLING      . Tetrahydrozoline HCl (VISINE OP) Place 1 drop into both eyes every 6 (six) hours. For dry eyes           Physical Exam: BP 111/63  Pulse 86  Resp 16  Ht 5\' 3"  (1.6 m)  Wt 111 lb (50.349 kg)  BMI 19.66 kg/m2  SpO2 98%  General appearance: alert, cooperative and no distress Neurologic: intact Heart: regular rate and rhythm, S1, S2 normal, no murmur, click, rub or gallop Lungs: clear to auscultation bilaterally   Diagnostic Studies & Laboratory data:         Recent Radiology Findings:  02/01/2012  *RADIOLOGY REPORT*  Clinical Data: Bronchoscopy  PORTABLE CHEST - 1 VIEW  Comparison: 01/29/2012  Findings: Right basilar opacity has enlarged.  Mild bibasilar atelectasis.  No pneumothorax.  Normal heart size.  IMPRESSION: Enlarged right basilar opacity.  This may indicate  a small amount of hemorrhage surrounding a lesion that has recently been biopsy. There is no pneumothorax.  Original Report Authenticated By: Donavan Burnet, M.D.   D Recent Labs: Lab Results  Component Value Date   WBC 4.9 01/29/2012   HGB 11.6* 01/29/2012   HCT 35.1* 01/29/2012   PLT 279 01/29/2012   GLUCOSE 89 01/29/2012   ALT 13 01/29/2012   AST 18 01/29/2012   NA 139 01/29/2012   K 3.7 01/29/2012   CL 99 01/29/2012   CREATININE 1.47* 01/29/2012   BUN 40* 01/29/2012   CO2 28 01/29/2012   INR 1.03 01/29/2012      Assessment / Plan:      I reviewed the findings of the pathology report with the patient her daughter. I have recommended that we proceed with right video-assisted thoracoscopy and resection of the lesion both for diagnosis and treatment. However after the biopsy the patient comes in today and wants to delay any further surgery until trip to [place]. I've made a return appointment to see me in the first week of March with a repeat  chest x-ray. Both she and her daughter are aware that if she changes her mind and wishes to proceed before that time to call immediately. The risks of delaying if this is malignant have been explained to her in detail.   Delight Ovens MD 02/06/2012 6:10 PM

## 2012-02-07 ENCOUNTER — Other Ambulatory Visit (HOSPITAL_COMMUNITY): Payer: Medicare Other

## 2012-02-08 ENCOUNTER — Encounter (HOSPITAL_COMMUNITY): Admission: RE | Payer: Self-pay | Source: Ambulatory Visit

## 2012-02-08 ENCOUNTER — Ambulatory Visit (HOSPITAL_COMMUNITY): Admission: RE | Admit: 2012-02-08 | Payer: Medicare Other | Source: Ambulatory Visit | Admitting: Cardiothoracic Surgery

## 2012-02-08 SURGERY — VIDEO ASSISTED THORACOSCOPY (VATS)/THOROCOTOMY
Anesthesia: General | Site: Chest | Laterality: Right

## 2012-02-12 ENCOUNTER — Encounter: Payer: Self-pay | Admitting: Cardiothoracic Surgery

## 2012-02-12 ENCOUNTER — Ambulatory Visit (INDEPENDENT_AMBULATORY_CARE_PROVIDER_SITE_OTHER): Payer: Medicare Other | Admitting: Cardiothoracic Surgery

## 2012-02-12 VITALS — BP 133/65 | HR 76 | Resp 16 | Ht 64.0 in | Wt 110.0 lb

## 2012-02-12 DIAGNOSIS — R222 Localized swelling, mass and lump, trunk: Secondary | ICD-10-CM

## 2012-02-12 DIAGNOSIS — R918 Other nonspecific abnormal finding of lung field: Secondary | ICD-10-CM

## 2012-02-12 NOTE — Progress Notes (Signed)
[address]            [place] [address]          [phone]        JOANMARIE TSANG [facility] Medical Record #[id number] Date of Birth: 08-Sep-1927  [name], MD  Chief Complaint:   PostOp Follow Up Visit   History of Present Illness:      Patient returns for followup visit after bronchoscopy EBUS, and ENB. The level X R. node showed lymphatic tissue but no evidence of tumor. We're unable to make a tissue diagnosis on the primary lesion. Patient returns today the discuss further treatment options. I recommended to her and her daughter to proceed with video-assisted thoracoscopy and resection of lesion both for diagnosis and treatment.  She has very slight hoarseness after her procedure but otherwise feels well.  After being seen last week in discussing the situation with her daughters the patient comes in today to further discuss proceeding with right video-assisted thoracoscopy and lung resection of suspicious mass in the right lower lobe. Last week when I talk to her she wanted to wait until able to do this but now is willing to proceed an earlier date.   History  Smoking status  . Former Smoker -- 1.0 packs/day for 50 years  . Types: Cigarettes  . Quit date: 12/31/1993  Smokeless tobacco  . Never Used       Allergies  Allergen Reactions  . Neosporin (Neomycin-Polymyx-Gramicid) Dermatitis  . Statins Other (See Comments)    Myalgias and unsteady gait    Current Outpatient Prescriptions  Medication Sig Dispense Refill  . acetaminophen (TYLENOL) 325 MG tablet Take 650 mg by mouth every 6 (six) hours as needed. For pain      . allopurinol (ZYLOPRIM) 100 MG tablet Take 100 mg by mouth daily.       Marland Kitchen amLODipine-olmesartan (AZOR) 10-40 MG per tablet Take 0.5 tablets by mouth daily.       Marland Kitchen aspirin 81 MG tablet Take 81 mg by mouth every Monday, Wednesday, and Friday.       . calcium-vitamin D (OSCAL WITH  D) 500-200 MG-UNIT per tablet Take 1 tablet by mouth 2 (two) times daily.      . colchicine 0.6 MG tablet Take 0.6 mg by mouth 2 (two) times daily as needed. For gout      . EPINEPHrine (EPIPEN 2-PAK) 0.3 mg/0.3 mL DEVI Inject 0.3 mg into the muscle once.      . fluticasone (FLONASE) 50 MCG/ACT nasal spray Place 2 sprays into the nose daily.      . folic acid (FOLVITE) 1 MG tablet Take 1 mg by mouth daily.      . furosemide (LASIX) 40 MG tablet Take 40 mg by mouth daily as needed. FOR LEG SWELLING      . Tetrahydrozoline HCl (VISINE OP) Place 1 drop into both eyes every 6 (six) hours. For dry eyes           Physical Exam: BP 133/65  Pulse 76  Resp 16  Ht 5\' 4"  (1.626 m)  Wt 110 lb (49.896 kg)  BMI 18.88 kg/m2  SpO2 98%  General appearance: alert, cooperative and no distress Neurologic: intact Heart: regular rate and rhythm, S1, S2  normal, no murmur, click, rub or gallop Lungs: clear to auscultation bilaterally   Diagnostic Studies & Laboratory data:         Recent Radiology Findings:  02/01/2012  *RADIOLOGY REPORT*  Clinical Data: Bronchoscopy  PORTABLE CHEST - 1 VIEW  Comparison: 01/29/2012  Findings: Right basilar opacity has enlarged.  Mild bibasilar atelectasis.  No pneumothorax.  Normal heart size.  IMPRESSION: Enlarged right basilar opacity.  This may indicate a small amount of hemorrhage surrounding a lesion that has recently been biopsy. There is no pneumothorax.  Original Report Authenticated By: Donavan Burnet, M.D.   D Recent Labs: Lab Results  Component Value Date   WBC 4.9 01/29/2012   HGB 11.6* 01/29/2012   HCT 35.1* 01/29/2012   PLT 279 01/29/2012   GLUCOSE 89 01/29/2012   ALT 13 01/29/2012   AST 18 01/29/2012   NA 139 01/29/2012   K 3.7 01/29/2012   CL 99 01/29/2012   CREATININE 1.47* 01/29/2012   BUN 40* 01/29/2012   CO2 28 01/29/2012   INR 1.03 01/29/2012      Assessment / Plan:      I reviewed the findings of the pathology report with the patient her  daughter. I have recommended that we proceed with right video-assisted thoracoscopy and resection of the lesion both for diagnosis and treatment. The procedure is described in detail to the patient in her 2 daughters including the expected postoperative course and possible risks. They understand the best in the long is suspicious but not diagnostic of carcinoma of the lung. The patient's questions have been answered.  The goals risks and alternatives of the planned surgical procedure right that's and lung resection possible lymph node dissection have been discussed with the patient in detail. The risks of the procedure including death, infection, stroke, myocardial infarction, bleeding, blood transfusion, prolonged airleak have all been discussed specifically.  I have quoted Delorise Jackson a 3 % of perioperative mortality and a complication rate as high as 15 %. The patient's questions have been answered.Delorise Jackson is willing  to proceed with the planned procedure. We'll plan to proceed on February 19.   Delight Ovens MD 02/12/2012 4:59 PM

## 2012-02-13 ENCOUNTER — Other Ambulatory Visit: Payer: Self-pay

## 2012-02-13 DIAGNOSIS — D381 Neoplasm of uncertain behavior of trachea, bronchus and lung: Secondary | ICD-10-CM

## 2012-02-15 ENCOUNTER — Inpatient Hospital Stay (HOSPITAL_COMMUNITY): Admission: RE | Admit: 2012-02-15 | Payer: Medicare Other | Source: Ambulatory Visit

## 2012-02-15 NOTE — Pre-Procedure Instructions (Signed)
20 April Burns  02/15/2012   Your procedure is scheduled on:  Tuesday, February 19  Report to [facility] at 5:30 AM.  Call this number if you have problems the morning of surgery: [phone]   Remember:   Do not eat food:After Midnight.  May have clear liquids: up to 4 Hours before arrival.  Clear liquids include soda, tea, black coffee, apple or grape juice, broth.  Take these medicines the morning of surgery with A SIP OF WATER: Allopurinol, Flonase   Do not wear jewelry, make-up or nail polish.  Do not wear lotions, powders, or perfumes. You may wear deodorant.  Do not shave 48 hours prior to surgery.  Do not bring valuables to the hospital.  Contacts, dentures or bridgework may not be worn into surgery.  Leave suitcase in the car. After surgery it may be brought to your room.  For patients admitted to the hospital, checkout time is 11:00 AM the day of discharge.   Patients discharged the day of surgery will not be allowed to drive home.  Name and phone number of your driver: NA  Special Instructions: CHG Shower Use Special Wash: 1/2 bottle night before surgery and 1/2 bottle morning of surgery.   Please read over the following fact sheets that you were given: Pain Booklet, Coughing and Deep Breathing, Blood Transfusion Information and Surgical Site Infection Prevention

## 2012-02-18 ENCOUNTER — Encounter (HOSPITAL_COMMUNITY): Payer: Self-pay

## 2012-02-18 ENCOUNTER — Encounter (HOSPITAL_COMMUNITY)
Admission: RE | Admit: 2012-02-18 | Discharge: 2012-02-18 | Disposition: A | Discharge status: Home / Self Care | Payer: Medicare Other | Source: Ambulatory Visit | Attending: Cardiothoracic Surgery | Admitting: Cardiothoracic Surgery

## 2012-02-18 DIAGNOSIS — I10 Essential (primary) hypertension: Secondary | ICD-10-CM | POA: Diagnosis not present

## 2012-02-18 DIAGNOSIS — J984 Other disorders of lung: Secondary | ICD-10-CM | POA: Diagnosis not present

## 2012-02-18 DIAGNOSIS — D381 Neoplasm of uncertain behavior of trachea, bronchus and lung: Secondary | ICD-10-CM

## 2012-02-18 DIAGNOSIS — J449 Chronic obstructive pulmonary disease, unspecified: Secondary | ICD-10-CM | POA: Diagnosis not present

## 2012-02-18 LAB — COMPREHENSIVE METABOLIC PANEL
ALT: 15 U/L (ref 0–35)
AST: 18 U/L (ref 0–37)
Albumin: 3.7 g/dL (ref 3.5–5.2)
Alkaline Phosphatase: 74 U/L (ref 39–117)
BUN: 42 mg/dL — ABNORMAL HIGH (ref 6–23)
CO2: 26 mEq/L (ref 19–32)
Calcium: 10.4 mg/dL (ref 8.4–10.5)
Chloride: 104 mEq/L (ref 96–112)
Creatinine, Ser: 1.32 mg/dL — ABNORMAL HIGH (ref 0.50–1.10)
GFR calc Af Amer: 42 mL/min — ABNORMAL LOW (ref >90)
GFR calc non Af Amer: 36 mL/min — ABNORMAL LOW (ref >90)
Glucose, Bld: 102 mg/dL — ABNORMAL HIGH (ref 70–99)
Potassium: 4.5 mEq/L (ref 3.5–5.1)
Sodium: 143 mEq/L (ref 135–145)
Total Bilirubin: 0.4 mg/dL (ref 0.3–1.2)
Total Protein: 7.3 g/dL (ref 6.0–8.3)

## 2012-02-18 LAB — URINALYSIS, ROUTINE W REFLEX MICROSCOPIC
Bilirubin Urine: NEGATIVE
Glucose, UA: NEGATIVE mg/dL
Hgb urine dipstick: NEGATIVE
Ketones, ur: NEGATIVE mg/dL
Leukocytes, UA: NEGATIVE
Nitrite: NEGATIVE
Protein, ur: NEGATIVE mg/dL
Specific Gravity, Urine: 1.015 (ref 1.005–1.030)
Urobilinogen, UA: 0.2 mg/dL (ref 0.0–1.0)
pH: 7 (ref 5.0–8.0)

## 2012-02-18 LAB — CBC
HCT: 35 % — ABNORMAL LOW (ref 36.0–46.0)
Hemoglobin: 11.7 g/dL — ABNORMAL LOW (ref 12.0–15.0)
MCH: 33.7 pg (ref 26.0–34.0)
MCHC: 33.4 g/dL (ref 30.0–36.0)
MCV: 100.9 fL — ABNORMAL HIGH (ref 78.0–100.0)
Platelets: 247 10*3/uL (ref 150–400)
RBC: 3.47 MIL/uL — ABNORMAL LOW (ref 3.87–5.11)
RDW: 13.3 % (ref 11.5–15.5)
WBC: 4.5 10*3/uL (ref 4.0–10.5)

## 2012-02-18 LAB — APTT: aPTT: 28 seconds (ref 24–37)

## 2012-02-18 LAB — BLOOD GAS, ARTERIAL
Acid-Base Excess: 5.1 mmol/L — ABNORMAL HIGH (ref 0.0–2.0)
Bicarbonate: 29.1 mEq/L — ABNORMAL HIGH (ref 20.0–24.0)
Drawn by: 344381
FIO2: 0.21 %
O2 Saturation: 97.5 %
Patient temperature: 98.6
TCO2: 30.4 mmol/L (ref 0–100)
pCO2 arterial: 42.9 mmHg (ref 35.0–45.0)
pH, Arterial: 7.446 — ABNORMAL HIGH (ref 7.350–7.400)
pO2, Arterial: 88.8 mmHg (ref 80.0–100.0)

## 2012-02-18 LAB — PROTIME-INR
INR: 0.95 (ref 0.00–1.49)
Prothrombin Time: 12.9 seconds (ref 11.6–15.2)

## 2012-02-18 LAB — TYPE AND SCREEN
ABO/RH(D): O POS
Antibody Screen: NEGATIVE

## 2012-02-18 LAB — SURGICAL PCR SCREEN
MRSA, PCR: NEGATIVE
Staphylococcus aureus: NEGATIVE

## 2012-02-18 LAB — ABO/RH: ABO/RH(D): O POS

## 2012-02-18 MED ORDER — DEXTROSE 5 % IV SOLN
1.5000 g | INTRAVENOUS | Status: AC
  Administered 2012-02-19: 1.5 g via INTRAVENOUS
  Filled 2012-02-18: qty 1.5

## 2012-02-19 ENCOUNTER — Other Ambulatory Visit: Payer: Self-pay | Admitting: Cardiothoracic Surgery

## 2012-02-19 ENCOUNTER — Encounter (HOSPITAL_COMMUNITY): Payer: Self-pay | Admitting: Certified Registered"

## 2012-02-19 ENCOUNTER — Inpatient Hospital Stay (HOSPITAL_COMMUNITY)
Admission: RE | Admit: 2012-02-19 | Discharge: 2012-02-23 | DRG: 165 | Disposition: A | Discharge status: Home / Self Care | Payer: Medicare Other | Source: Ambulatory Visit | Attending: Cardiothoracic Surgery | Admitting: Cardiothoracic Surgery

## 2012-02-19 ENCOUNTER — Encounter (HOSPITAL_COMMUNITY)
Admission: RE | Disposition: A | Discharge status: Home / Self Care | Payer: Self-pay | Source: Ambulatory Visit | Attending: Cardiothoracic Surgery

## 2012-02-19 ENCOUNTER — Encounter (HOSPITAL_COMMUNITY): Payer: Self-pay | Admitting: *Deleted

## 2012-02-19 ENCOUNTER — Ambulatory Visit (HOSPITAL_COMMUNITY): Payer: Medicare Other

## 2012-02-19 ENCOUNTER — Ambulatory Visit (HOSPITAL_COMMUNITY): Payer: Medicare Other | Admitting: Certified Registered"

## 2012-02-19 DIAGNOSIS — R222 Localized swelling, mass and lump, trunk: Secondary | ICD-10-CM | POA: Diagnosis not present

## 2012-02-19 DIAGNOSIS — D381 Neoplasm of uncertain behavior of trachea, bronchus and lung: Secondary | ICD-10-CM

## 2012-02-19 DIAGNOSIS — M109 Gout, unspecified: Secondary | ICD-10-CM | POA: Diagnosis present

## 2012-02-19 DIAGNOSIS — E7800 Pure hypercholesterolemia, unspecified: Secondary | ICD-10-CM | POA: Diagnosis not present

## 2012-02-19 DIAGNOSIS — M899 Disorder of bone, unspecified: Secondary | ICD-10-CM | POA: Diagnosis present

## 2012-02-19 DIAGNOSIS — R918 Other nonspecific abnormal finding of lung field: Secondary | ICD-10-CM | POA: Diagnosis present

## 2012-02-19 DIAGNOSIS — Z7982 Long term (current) use of aspirin: Secondary | ICD-10-CM

## 2012-02-19 DIAGNOSIS — Z09 Encounter for follow-up examination after completed treatment for conditions other than malignant neoplasm: Secondary | ICD-10-CM | POA: Diagnosis not present

## 2012-02-19 DIAGNOSIS — J9819 Other pulmonary collapse: Secondary | ICD-10-CM | POA: Diagnosis not present

## 2012-02-19 DIAGNOSIS — Z01812 Encounter for preprocedural laboratory examination: Secondary | ICD-10-CM | POA: Diagnosis not present

## 2012-02-19 DIAGNOSIS — Z79899 Other long term (current) drug therapy: Secondary | ICD-10-CM | POA: Diagnosis not present

## 2012-02-19 DIAGNOSIS — I10 Essential (primary) hypertension: Secondary | ICD-10-CM | POA: Diagnosis present

## 2012-02-19 DIAGNOSIS — J189 Pneumonia, unspecified organism: Secondary | ICD-10-CM | POA: Diagnosis not present

## 2012-02-19 DIAGNOSIS — Z87891 Personal history of nicotine dependence: Secondary | ICD-10-CM

## 2012-02-19 DIAGNOSIS — J90 Pleural effusion, not elsewhere classified: Secondary | ICD-10-CM | POA: Diagnosis not present

## 2012-02-19 DIAGNOSIS — G2581 Restless legs syndrome: Secondary | ICD-10-CM | POA: Diagnosis present

## 2012-02-19 DIAGNOSIS — I881 Chronic lymphadenitis, except mesenteric: Secondary | ICD-10-CM | POA: Diagnosis not present

## 2012-02-19 DIAGNOSIS — C3430 Malignant neoplasm of lower lobe, unspecified bronchus or lung: Secondary | ICD-10-CM | POA: Diagnosis not present

## 2012-02-19 DIAGNOSIS — R0989 Other specified symptoms and signs involving the circulatory and respiratory systems: Secondary | ICD-10-CM | POA: Diagnosis not present

## 2012-02-19 DIAGNOSIS — J309 Allergic rhinitis, unspecified: Secondary | ICD-10-CM | POA: Diagnosis present

## 2012-02-19 DIAGNOSIS — J8410 Pulmonary fibrosis, unspecified: Principal | ICD-10-CM | POA: Diagnosis present

## 2012-02-19 DIAGNOSIS — K5730 Diverticulosis of large intestine without perforation or abscess without bleeding: Secondary | ICD-10-CM | POA: Diagnosis not present

## 2012-02-19 SURGERY — VIDEO ASSISTED THORACOSCOPY (VATS)/WEDGE RESECTION
Anesthesia: General | Site: Chest | Laterality: Right | Wound class: Clean Contaminated

## 2012-02-19 MED ORDER — HEMOSTATIC AGENTS (NO CHARGE) OPTIME
TOPICAL | Status: DC | PRN
  Administered 2012-02-19: 1 via TOPICAL

## 2012-02-19 MED ORDER — SUFENTANIL CITRATE 50 MCG/ML IV SOLN
INTRAVENOUS | Status: DC | PRN
  Administered 2012-02-19: 10 ug via INTRAVENOUS
  Administered 2012-02-19: 15 ug via INTRAVENOUS
  Administered 2012-02-19: 5 ug via INTRAVENOUS
  Administered 2012-02-19 (×3): 10 ug via INTRAVENOUS

## 2012-02-19 MED ORDER — BISACODYL 5 MG PO TBEC
10.0000 mg | DELAYED_RELEASE_TABLET | Freq: Every day | ORAL | Status: DC
  Administered 2012-02-20 – 2012-02-22 (×3): 10 mg via ORAL
  Filled 2012-02-19 (×3): qty 2

## 2012-02-19 MED ORDER — CALCIUM CARBONATE-VITAMIN D 500-200 MG-UNIT PO TABS
1.0000 | ORAL_TABLET | Freq: Two times a day (BID) | ORAL | Status: DC
  Administered 2012-02-20 – 2012-02-23 (×6): 1 via ORAL
  Filled 2012-02-19 (×9): qty 1

## 2012-02-19 MED ORDER — DEXAMETHASONE SODIUM PHOSPHATE 4 MG/ML IJ SOLN
INTRAMUSCULAR | Status: DC | PRN
  Administered 2012-02-19: 4 mg via INTRAVENOUS

## 2012-02-19 MED ORDER — ONDANSETRON HCL 4 MG/2ML IJ SOLN: 4.0000 mg | Freq: Once | INTRAMUSCULAR | Status: DC | PRN

## 2012-02-19 MED ORDER — DEXTROSE 5 % IV SOLN
1.5000 g | Freq: Two times a day (BID) | INTRAVENOUS | Status: AC
  Administered 2012-02-19 – 2012-02-20 (×2): 1.5 g via INTRAVENOUS
  Filled 2012-02-19 (×3): qty 1.5

## 2012-02-19 MED ORDER — DROPERIDOL 2.5 MG/ML IJ SOLN
INTRAMUSCULAR | Status: DC | PRN
  Administered 2012-02-19: 0.625 mg via INTRAVENOUS

## 2012-02-19 MED ORDER — ALLOPURINOL 100 MG PO TABS
100.0000 mg | ORAL_TABLET | Freq: Every day | ORAL | Status: DC
  Administered 2012-02-20 – 2012-02-23 (×4): 100 mg via ORAL
  Filled 2012-02-19 (×5): qty 1

## 2012-02-19 MED ORDER — SENNOSIDES-DOCUSATE SODIUM 8.6-50 MG PO TABS
1.0000 | ORAL_TABLET | Freq: Every evening | ORAL | Status: DC | PRN
  Filled 2012-02-19: qty 1

## 2012-02-19 MED ORDER — PROPOFOL 10 MG/ML IV EMUL
INTRAVENOUS | Status: DC | PRN
  Administered 2012-02-19: 160 mg via INTRAVENOUS

## 2012-02-19 MED ORDER — DIPHENHYDRAMINE HCL 50 MG/ML IJ SOLN: 12.5000 mg | Freq: Four times a day (QID) | INTRAMUSCULAR | Status: DC | PRN

## 2012-02-19 MED ORDER — COLCHICINE 0.6 MG PO TABS
0.6000 mg | ORAL_TABLET | Freq: Every day | ORAL | Status: DC | PRN
  Filled 2012-02-19: qty 1

## 2012-02-19 MED ORDER — SODIUM CHLORIDE 0.9 % IJ SOLN: 9.0000 mL | INTRAMUSCULAR | Status: DC | PRN

## 2012-02-19 MED ORDER — LACTATED RINGERS IV SOLN
INTRAVENOUS | Status: DC | PRN
  Administered 2012-02-19 (×2): via INTRAVENOUS

## 2012-02-19 MED ORDER — FENTANYL 10 MCG/ML IV SOLN
INTRAVENOUS | Status: DC
  Administered 2012-02-19: 30 ug via INTRAVENOUS
  Administered 2012-02-19: 12:00:00 via INTRAVENOUS
  Administered 2012-02-20 (×2): 20 ug via INTRAVENOUS
  Administered 2012-02-20: 60 ug via INTRAVENOUS
  Administered 2012-02-20: 50 ug via INTRAVENOUS
  Administered 2012-02-20: 20 ug via INTRAVENOUS
  Administered 2012-02-21: 70 ug via INTRAVENOUS
  Administered 2012-02-21: 50 ug via INTRAVENOUS
  Administered 2012-02-21: 40 ug via INTRAVENOUS
  Filled 2012-02-19 (×2): qty 50

## 2012-02-19 MED ORDER — DIPHENHYDRAMINE HCL 12.5 MG/5ML PO ELIX
12.5000 mg | ORAL_SOLUTION | Freq: Four times a day (QID) | ORAL | Status: DC | PRN
  Filled 2012-02-19: qty 5

## 2012-02-19 MED ORDER — POTASSIUM CHLORIDE 10 MEQ/50ML IV SOLN: 10.0000 meq | Freq: Every day | INTRAVENOUS | Status: DC | PRN

## 2012-02-19 MED ORDER — BUPIVACAINE ON-Q PAIN PUMP (FOR ORDER SET NO CHG): INJECTION | Status: DC

## 2012-02-19 MED ORDER — TRAMADOL HCL 50 MG PO TABS
50.0000 mg | ORAL_TABLET | ORAL | Status: DC | PRN
  Administered 2012-02-20 – 2012-02-23 (×5): 50 mg via ORAL
  Filled 2012-02-19 (×4): qty 1
  Filled 2012-02-19: qty 2

## 2012-02-19 MED ORDER — ROCURONIUM BROMIDE 100 MG/10ML IV SOLN
INTRAVENOUS | Status: DC | PRN
  Administered 2012-02-19 (×3): 10 mg via INTRAVENOUS
  Administered 2012-02-19: 50 mg via INTRAVENOUS

## 2012-02-19 MED ORDER — FLUTICASONE PROPIONATE 50 MCG/ACT NA SUSP
2.0000 | Freq: Every day | NASAL | Status: DC
  Administered 2012-02-20 – 2012-02-22 (×3): 2 via NASAL
  Filled 2012-02-19: qty 16

## 2012-02-19 MED ORDER — MORPHINE SULFATE 2 MG/ML IJ SOLN: 0.0500 mg/kg | INTRAMUSCULAR | Status: DC | PRN

## 2012-02-19 MED ORDER — ACETAMINOPHEN 10 MG/ML IV SOLN
1000.0000 mg | Freq: Four times a day (QID) | INTRAVENOUS | Status: AC
  Administered 2012-02-19 – 2012-02-20 (×4): 1000 mg via INTRAVENOUS
  Filled 2012-02-19 (×3): qty 100

## 2012-02-19 MED ORDER — ONDANSETRON HCL 4 MG/2ML IJ SOLN: 4.0000 mg | Freq: Four times a day (QID) | INTRAMUSCULAR | Status: DC | PRN

## 2012-02-19 MED ORDER — BUPIVACAINE 0.5 % ON-Q PUMP SINGLE CATH 400 ML
400.0000 mL | INJECTION | Status: AC
  Administered 2012-02-19: 400 mL
  Filled 2012-02-19 (×2): qty 400

## 2012-02-19 MED ORDER — DEXTROSE-NACL 5-0.9 % IV SOLN
INTRAVENOUS | Status: DC
  Administered 2012-02-19 – 2012-02-20 (×3): via INTRAVENOUS

## 2012-02-19 MED ORDER — MEPERIDINE HCL 25 MG/ML IJ SOLN: 6.2500 mg | INTRAMUSCULAR | Status: DC | PRN

## 2012-02-19 MED ORDER — FOLIC ACID 1 MG PO TABS
1.0000 mg | ORAL_TABLET | Freq: Every day | ORAL | Status: DC
  Administered 2012-02-20 – 2012-02-23 (×4): 1 mg via ORAL
  Filled 2012-02-19 (×5): qty 1

## 2012-02-19 MED ORDER — NALOXONE HCL 0.4 MG/ML IJ SOLN: 0.4000 mg | INTRAMUSCULAR | Status: DC | PRN

## 2012-02-19 MED ORDER — GLYCOPYRROLATE 0.2 MG/ML IJ SOLN
INTRAMUSCULAR | Status: DC | PRN
  Administered 2012-02-19: .4 mg via INTRAVENOUS

## 2012-02-19 MED ORDER — 0.9 % SODIUM CHLORIDE (POUR BTL) OPTIME
TOPICAL | Status: DC | PRN
  Administered 2012-02-19: 2000 mL

## 2012-02-19 MED ORDER — NEOSTIGMINE METHYLSULFATE 1 MG/ML IJ SOLN
INTRAMUSCULAR | Status: DC | PRN
  Administered 2012-02-19: 3 mg via INTRAVENOUS

## 2012-02-19 MED ORDER — ONDANSETRON HCL 4 MG/2ML IJ SOLN
INTRAMUSCULAR | Status: DC | PRN
  Administered 2012-02-19: 4 mg via INTRAVENOUS

## 2012-02-19 MED ORDER — AMLODIPINE BESYLATE 10 MG PO TABS
10.0000 mg | ORAL_TABLET | Freq: Every day | ORAL | Status: DC
  Administered 2012-02-20 – 2012-02-23 (×4): 10 mg via ORAL
  Filled 2012-02-19 (×4): qty 1

## 2012-02-19 MED ORDER — HYDROMORPHONE HCL PF 1 MG/ML IJ SOLN
0.2500 mg | INTRAMUSCULAR | Status: DC | PRN
Start: 2012-02-19 — End: 2012-02-19
  Administered 2012-02-19 (×2): 0.25 mg via INTRAVENOUS

## 2012-02-19 MED ORDER — EPHEDRINE SULFATE 50 MG/ML IJ SOLN
INTRAMUSCULAR | Status: DC | PRN
  Administered 2012-02-19 (×3): 5 mg via INTRAVENOUS
  Administered 2012-02-19: 10 mg via INTRAVENOUS
  Administered 2012-02-19 (×4): 5 mg via INTRAVENOUS

## 2012-02-19 MED ORDER — HYDROMORPHONE HCL PF 1 MG/ML IJ SOLN
INTRAMUSCULAR | Status: AC
  Filled 2012-02-19: qty 1

## 2012-02-19 SURGICAL SUPPLY — 65 items
ADH SKN CLS APL DERMABOND .7 (GAUZE/BANDAGES/DRESSINGS) ×1
APL SRG 22X2 LUM MLBL SLNT (VASCULAR PRODUCTS)
APL SRG 7X2 LUM MLBL SLNT (VASCULAR PRODUCTS)
APPLICATOR TIP COSEAL (VASCULAR PRODUCTS) IMPLANT
APPLICATOR TIP EXT COSEAL (VASCULAR PRODUCTS) IMPLANT
BLADE SURG 10 STRL SS (BLADE) ×2 IMPLANT
CANISTER SUCTION 2500CC (MISCELLANEOUS) ×2 IMPLANT
CATH KIT ON Q 5IN SLV (PAIN MANAGEMENT) ×2 IMPLANT
CATH THORACIC 28FR (CATHETERS) IMPLANT
CATH THORACIC 36FR (CATHETERS) IMPLANT
CATH THORACIC 36FR RT ANG (CATHETERS) IMPLANT
CLIP TI MEDIUM 6 (CLIP) IMPLANT
CLOTH BEACON ORANGE TIMEOUT ST (SAFETY) ×2 IMPLANT
CONT SPEC 4OZ CLIKSEAL STRL BL (MISCELLANEOUS) ×6 IMPLANT
DERMABOND ADVANCED (GAUZE/BANDAGES/DRESSINGS) ×1
DERMABOND ADVANCED .7 DNX12 (GAUZE/BANDAGES/DRESSINGS) ×1 IMPLANT
DRAPE LAPAROSCOPIC ABDOMINAL (DRAPES) ×2 IMPLANT
DRAPE SLUSH MACHINE 52X66 (DRAPES) ×2 IMPLANT
DRAPE SLUSH/WARMER DISC (DRAPES) IMPLANT
DRILL BIT 7/64X5 (BIT) ×2 IMPLANT
ELECT REM PT RETURN 9FT ADLT (ELECTROSURGICAL) ×2
ELECTRODE REM PT RTRN 9FT ADLT (ELECTROSURGICAL) ×1 IMPLANT
GLOVE BIO SURGEON STRL SZ 6.5 (GLOVE) ×4 IMPLANT
GOWN STRL NON-REIN LRG LVL3 (GOWN DISPOSABLE) ×8 IMPLANT
HANDLE UNIV ENDO GIA (ENDOMECHANICALS) ×2 IMPLANT
HEMOSTAT SURGICEL 2X14 (HEMOSTASIS) ×2 IMPLANT
KIT BASIN OR (CUSTOM PROCEDURE TRAY) ×2 IMPLANT
KIT ROOM TURNOVER OR (KITS) ×2 IMPLANT
KIT SUCTION CATH 14FR (SUCTIONS) ×2 IMPLANT
NS IRRIG 1000ML POUR BTL (IV SOLUTION) ×4 IMPLANT
PACK CHEST (CUSTOM PROCEDURE TRAY) ×2 IMPLANT
PAD ARMBOARD 7.5X6 YLW CONV (MISCELLANEOUS) ×4 IMPLANT
RELOAD EGIA 45 MED/THCK PURPLE (STAPLE) ×10 IMPLANT
SEALANT PROGEL (MISCELLANEOUS) IMPLANT
SEALANT SURG COSEAL 4ML (VASCULAR PRODUCTS) IMPLANT
SEALANT SURG COSEAL 8ML (VASCULAR PRODUCTS) IMPLANT
SOLUTION ANTI FOG 6CC (MISCELLANEOUS) ×2 IMPLANT
SPONGE GAUZE 4X4 12PLY (GAUZE/BANDAGES/DRESSINGS) ×2 IMPLANT
SUT PROLENE 3 0 SH DA (SUTURE) IMPLANT
SUT PROLENE 4 0 RB 1 (SUTURE) ×2
SUT PROLENE 4-0 RB1 .5 CRCL 36 (SUTURE) ×2 IMPLANT
SUT SILK  1 MH (SUTURE) ×2
SUT SILK 1 MH (SUTURE) ×2 IMPLANT
SUT SILK 2 0SH CR/8 30 (SUTURE) ×2 IMPLANT
SUT SILK 3 0SH CR/8 30 (SUTURE) IMPLANT
SUT VIC AB 1 CTX 18 (SUTURE) ×2 IMPLANT
SUT VIC AB 1 CTX 36 (SUTURE) ×2
SUT VIC AB 1 CTX36XBRD ANBCTR (SUTURE) ×1 IMPLANT
SUT VIC AB 2-0 CT1 27 (SUTURE) ×1
SUT VIC AB 2-0 CT1 TAPERPNT 27 (SUTURE) ×1 IMPLANT
SUT VIC AB 2-0 CTX 36 (SUTURE) IMPLANT
SUT VIC AB 2-0 UR6 27 (SUTURE) ×2 IMPLANT
SUT VIC AB 3-0 SH 8-18 (SUTURE) ×2 IMPLANT
SUT VIC AB 3-0 X1 27 (SUTURE) ×4 IMPLANT
SUT VICRYL 2 TP 1 (SUTURE) ×2 IMPLANT
SWAB COLLECTION DEVICE MRSA (MISCELLANEOUS) IMPLANT
SYSTEM SAHARA CHEST DRAIN ATS (WOUND CARE) ×2 IMPLANT
TIP APPLICATOR SPRAY EXTEND 16 (VASCULAR PRODUCTS) IMPLANT
TOWEL OR 17X24 6PK STRL BLUE (TOWEL DISPOSABLE) ×2 IMPLANT
TOWEL OR 17X26 10 PK STRL BLUE (TOWEL DISPOSABLE) ×4 IMPLANT
TRAP SPECIMEN MUCOUS 40CC (MISCELLANEOUS) ×2 IMPLANT
TRAY FOLEY CATH 14FRSI W/METER (CATHETERS) ×2 IMPLANT
TUBE ANAEROBIC SPECIMEN COL (MISCELLANEOUS) IMPLANT
TUNNELER SHEATH ON-Q 11GX8 (MISCELLANEOUS) ×2 IMPLANT
WATER STERILE IRR 1000ML POUR (IV SOLUTION) ×4 IMPLANT

## 2012-02-19 NOTE — Anesthesia Preprocedure Evaluation (Signed)
Anesthesia Evaluation  Patient identified by MRN, date of birth, ID band Patient awake    Reviewed: Allergy & Precautions, H&P , NPO status , Patient's Chart, lab work & pertinent test results  Airway Mallampati: I TM Distance: >3 FB Neck ROM: Full    Dental  (+) Teeth Intact and Dental Advisory Given   Pulmonary  clear to auscultation        Cardiovascular Regular Normal    Neuro/Psych    GI/Hepatic   Endo/Other    Renal/GU      Musculoskeletal   Abdominal   Peds  Hematology   Anesthesia Other Findings   Reproductive/Obstetrics                           Anesthesia Physical Anesthesia Plan  ASA: III  Anesthesia Plan: General   Post-op Pain Management:    Induction: Intravenous  Airway Management Planned: Double Lumen EBT  Additional Equipment:   Intra-op Plan:   Post-operative Plan: Extubation in OR  Informed Consent: I have reviewed the patients History and Physical, chart, labs and discussed the procedure including the risks, benefits and alternatives for the proposed anesthesia with the patient or authorized representative who has indicated his/her understanding and acceptance.   Dental advisory given  Plan Discussed with: CRNA, Anesthesiologist and Surgeon  Anesthesia Plan Comments:         Anesthesia Quick Evaluation

## 2012-02-19 NOTE — Progress Notes (Signed)
Patient ID: April Burns, female   DOB: February 17, 1927, 78 y.o.   MRN: [id number] S/p VATS wedge resection Comfortable Minimal CT output. BP 104/64  Pulse 74  Temp(Src) 97.5 F (36.4 C) (Oral)  Resp 18  Ht 5\' 3"  (1.6 m)  Wt 49.896 kg (110 lb)  BMI 19.49 kg/m2  SpO2 100%

## 2012-02-19 NOTE — Progress Notes (Signed)
CXR report rec'd from Joe in radiology, Chest tube in place, no pneumo found

## 2012-02-19 NOTE — Transfer of Care (Signed)
Immediate Anesthesia Transfer of Care Note  Patient: April Burns  Procedure(s) Performed: Procedure(s) (LRB): VIDEO ASSISTED THORACOSCOPY (VATS)/WEDGE RESECTION (Right)  Patient Location: PACU  Anesthesia Type: General  Level of Consciousness: oriented, sedated, patient cooperative and responds to stimulation  Airway & Oxygen Therapy: Patient Spontanous Breathing and Patient connected to nasal cannula oxygen  Post-op Assessment: Report given to PACU RN, Post -op Vital signs reviewed and stable and Patient moving all extremities  Post vital signs: Reviewed and stable  Complications: No apparent anesthesia complications

## 2012-02-19 NOTE — H&P (Signed)
[address]            [place] [address]          [phone]          April Burns [facility] Medical Record #[id number] Date of Birth: 1927-07-20  Referring: [name], MD Primary Care: [name], MD  Chief Complaint:    Lung Mass  History of Present Illness:    Patient is a 78 year old female with a remote smoking history who presents with a 12 pound weight loss over the past 6 months. She's had a dry cough no hemoptysis. She denies fever chills or night sweats. She's had no change in bowel habits. Because of these symptoms she saw Dr. Kevan Ny and a CT scan of the abdomen was performed. 2 right lower lobe lung masses were detected and a followup CT scan of the chest was performed.    She denies chest pain or shortness of breath. She denies any bone pain. PFT's and PET Scan   Current Activity/ Functional Status: Patient is independent with mobility/ambulation, transfers, ADL's, IADL's.   Past Medical History  Diagnosis Date  . Hypercholesterolemia   . Gout   . Allergic rhinitis   . Restless leg syndrome   . HTN (hypertension)   . Diverticulosis     no current flare up  . Hearing loss   . Osteopenia   . Cancer   . Lung mass     R lung  . Arthritis     gout    Past Surgical History  Procedure Date  . C section x2   . Tah ovaries intact   . Rt leg vein strippin for varicosities   . Cataract extractions bilateral 2010/2011  . [place]   . Abdominal hysterectomy   . Video bronchoscopy 02/01/2012    Procedure: VIDEO BRONCHOSCOPY;  Surgeon: Delight Ovens, MD;  Location: [facility] OR;  Service: Thoracic;  Laterality: N/A;  . Eye surgery     bilateral    Family History  Problem Relation Age of Onset  . Heart disease Father   . Alcohol abuse Brother     History   Social History  . Marital Status: Single    Spouse Name: N/A    Number of Children: N/A  . Years of Education: N/A    Occupational History  . Not on file.   Social History Main Topics  . Smoking status: Former Smoker -- 1.0 packs/day for 50 years    Types: Cigarettes    Quit date: 12/31/1993  . Smokeless tobacco: Never Used  . Alcohol Use: 5.0 oz/week    0 Glasses of wine, 0 Cans of beer, 0 Shots of liquor, 10 Drinks containing 0.5 oz of alcohol per week  . Drug Use: No  . Sexually Active: Not on file   Other Topics Concern  . 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 Ann Maki- in-Law Deborra Medina died suddenly of MI at age 36 yo.     History  Smoking status  . Former Smoker -- 1.0 packs/day for 50 years  . Types: Cigarettes  . Quit date: 12/31/1993  Smokeless tobacco  . Never Used  History  Alcohol Use  . 5.0 oz/week  . 0 Glasses of wine, 0 Cans of beer, 0 Shots of liquor, 10 Drinks containing 0.5 oz of alcohol per week     Allergies  Allergen Reactions  . Neosporin (Neomycin-Polymyx-Gramicid) Dermatitis  . Statins Other (See Comments)    Myalgias and unsteady gait    Current Facility-Administered Medications  Medication Dose Route Frequency Provider Last Rate Last Dose  . cefUROXime (ZINACEF) 1.5 g in dextrose 5 % 50 mL IVPB  1.5 g Intravenous 60 min Pre-Op Delight Ovens, MD       Facility-Administered Medications Ordered in Other Encounters  Medication Dose Route Frequency Provider Last Rate Last Dose  . lactated ringers infusion    Continuous PRN Lorre Munroe, CRNA           Review of Systems:     Cardiac Review of Systems: Y or N  Chest Pain [  n  ]  Resting SOB [n   ] Exertional SOB  [ n ]  Orthopnea [ n ]   Pedal Edema [ n  ]    Palpitations [n  ] Syncope  [n  ]   Presyncope [ n  ]  General Review of Systems: [Y] = yes [  ]=no Constitional: recent weight change [place]  ]; anorexia [ y ]; fatigue [ n ]; nausea [ n ]; night sweats [ n ]; fever [ n ]; or  chills [n  ];                                                                                                                                          Dental: poor dentition[  ];   Eye : blurred vision [  ]; diplopia [   ]; vision changes [  ];  Amaurosis fugax[  ]; Resp: cough [ y ];  wheezing[ n ];  hemoptysis[n  ]; shortness of breath[ n ]; paroxysmal nocturnal dyspnea[  n]; dyspnea on exertion[n  ]; or orthopnea[n  ];  GI:  gallstones[  ], vomiting[  ];  dysphagia[  ]; melena[n  ];  hematochezia [n  ]; heartburn[  ];   Hx of  Colonoscopy[  ]; GU: kidney stones [  ]; hematuria[  ];   dysuria [  ];  nocturia[  ];  history of     obstruction [  ];             Skin: rash, swelling[  ];, hair loss[  ];  peripheral edema[  ];  or itching[  ]; Musculosketetal: myalgias[  ];  joint swelling[  ];  joint erythema[  ];  joint pain[ n ];  back pain[n  ];  Heme/Lymph: bruising[n  ];  bleeding[ n ];  anemia[  ];  Neuro: TIA[  ];  headaches[  ];  stroke[n];  vertigo[ n ];  seizures[ n ];  paresthesias[n  ];  difficulty walking[ n ];  Psych:depression[  ]; anxiety[  ];  Endocrine: diabetes[  ];  thyroid dysfunction[  ];  Immunizations: Flu [ ? ]; Pneumococcal[ ? ];  Other:  Physical Exam: BP 148/64  Pulse 61  Temp(Src) 98.3 F (36.8 C) (Oral)  Resp 18  SpO2 95%  General appearance: alert, cooperative, appears stated age and no distress Neurologic: intact Heart: regular rate and rhythm, S1, S2 normal, no murmur, click, rub or gallop Lungs: clear to auscultation bilaterally Abdomen: soft, non-tender; bowel sounds normal; no masses,  no organomegaly Extremities: extremities normal, atraumatic, no cyanosis or edema and Homans sign is negative, no sign of DVT No cervical or supraclavicular adenopathy No carotid bruits   Diagnostic Studies & Laboratory data:     Recent Radiology Findings:   Nm Pet Image Initial (pi) Skull Base To Thigh  01/22/2012  *RADIOLOGY REPORT*  Clinical Data:  Initial  treatment strategy for lung carcinoma.  NUCLEAR MEDICINE PET CT INITIAL (PI) SKULL BASE TO THIGH  Technique:  19.9 mCi F-18 FDG was injected intravenously via the right arm.  Full-ring PET imaging was performed from the skull base through the mid-thighs 70  minutes after injection.  CT data was obtained and used for attenuation correction and anatomic localization only.  (This was not acquired as a diagnostic CT examination.)  Fasting Blood Glucose:  102  Patient Weight:  111 pounds.  Comparison:  CT on 01/10/2012 and 01/08/2012  Findings: Irregular nodular opacity in the right lower lobe abutting the right hemidiaphragm shows hypermetabolic activity, with a maximum SUV measuring 4.6.  A 8 mm pulmonary nodule in the posterior right lung base shows no significant hypermetabolic activity.  A 6 mm nodular density in the posterior right middle lobe abutting the major fissure also shows no hypermetabolic activity.  Both of these are below PET size threshold for detection of malignancy.  A 8 mm nodular density in the posterior right upper lobe also abutting the major fissure shows mild hypermetabolic activity, with maximum SUV measuring 1.6.  Asymmetric hypermetabolic activity is seen in the right hilum, with maximum SUV measuring 3.9.  No hypermetabolic mediastinal lymphadenopathy is identified.  No hypermetabolic masses or lymphadenopathy identified within the neck, abdomen, or pelvis.  IMPRESSION:  1.  Irregular nodular opacity in the right lower lobe abutting the diaphragm is hypermetabolic, with maximum SUV of 4.6.  Bronchogenic carcinoma cannot be excluded. 2.  8 mm nodule in the posterior right upper lobe with low grade hypermetabolic activity.  Differential diagnosis includes carcinoma and inflammatory or infectious etiologies. 3.  Two less than 1 cm pulmonary nodules in the posterior right middle and lower lobes show no hypermetabolic activity, but are below PET size threshold for reliable detection of  malignancy. Continued follow-up by chest CT is recommended. 4.  Mild right hilar hypermetabolic activity, which may be reactive or neoplastic.  No hypermetabolic mediastinal lymph nodes identified.  5.  No evidence of extra-thoracic malignancy.  Original Report Authenticated By: Danae Orleans, M.D.  Ct Abdomen Pelvis Wo Contrast  01/08/2012  *RADIOLOGY REPORT*  Clinical Data: Unexplained weight loss, change in bowel habits  CT ABDOMEN AND PELVIS WITHOUT CONTRAST  Technique:  Multidetector CT imaging of the abdomen and pelvis was performed following the standard protocol without intravenous contrast.  Comparison: None - - - no IV contrast media was given due to abnormal renal laboratory values and GFR of 27.  Findings: There is an abnormal nodular opacity abutting the  right hemidiaphragm at the right lung base within the right lower lobes suspicious for lung neoplasm.  On sagittal and coronal images this area measures 2.5 cm in width, 2.6 cm in depth, and 1.1 cm in height.  A 9 mm nodular lesion also is noted more posteriorly within the right lower lobe suspicious for metastasis.  CT of the chest with IV contrast may be warranted to evaluate for mediastinal or hilar adenopathy as well as additional liver lesions.  The liver is unremarkable in the unenhanced state.  No ductal dilatation is seen.  The gallbladder is contracted with no calcified gallstones.  There is some higher attenuation material layering within the gallbladder representing either gallbladder sludge or noncalcified gallstones.  The pancreas is normal in size and the pancreatic duct is not dilated.  The adrenal glands and spleen are unremarkable.  The stomach is moderately distended with fluid with no abnormality noted.  The kidneys are unremarkable in the unenhanced state.  No renal calculi or mass is seen and no hydronephrosis is noted.  The abdominal aorta is normal in caliber with atheromatous change present.  The urinary bladder is decompressed  and cannot be evaluated.  The uterus has been resected previously.  No adnexal lesion is seen. No fluid is noted within the pelvis.  There are multiple rectosigmoid colonic diverticula present.  Diverticula also are scattered throughout the remainder of the colon.  The terminal ileum is unremarkable.  Degenerative changes are present throughout the lumbar spine.  IMPRESSION:  1.  Abnormal nodular lesion at the right lung base suspicious for primary lung carcinoma.  Recommend CT of the chest with IV contrast to assess further.  Probable adjacent metastatic lesion in the right lower lobe as well. 2.  No abdominal or pelvic mass or adenopathy. 3.  Diffuse colonic diverticula.  Original Report Authenticated By: Juline Patch, M.D.   Ct Chest Wo Contrast  01/10/2012  *RADIOLOGY REPORT*  Clinical Data: Left lower lobe lung lesion noted on recent CT of the abdomen pelvis, follow-up, smoking history, weight loss  CT CHEST WITHOUT CONTRAST  Technique:  Multidetector CT imaging of the chest was performed following the standard protocol without IV contrast.  Comparison: CT abdomen pelvis of 01/08/2012  Findings: The irregular nodular lesion abutting the right hemidiaphragm within the right lower lobe at the right lung base is again noted.  This area is somewhat difficult to measure, and on the current study better seen on the sagittal and coronal images, this area has a a depth of 2.0 cm, height of 1.3 cm, width of approximately 1.8 cm.  There are several small surrounding nodular lesions within the right lower lobe, as well as a 9-mm nodule posteriorly in the right lower lobe.  In addition there is a right middle lobe nodule of 7 mm and a right upper lobe lesion abutting the fissure measuring 7 mm.  The dominant lesion abutting the hemidiaphragm at the right lung base is highly suspicious for primary lung carcinoma with apparent ipsilateral lung metastases. No left lung nodules are seen.  No pleural effusion is noted.   PET- CT may be helpful in assessing metabolic activity of this lesion and possible metastases.  On soft tissue window images, the thyroid gland is unremarkable. On this unenhanced study, no mediastinal or hilar adenopathy is seen with a few small precarinal nodes present.  No axillary adenopathy is noted.  There are degenerative changes diffusely throughout the thoracic spine.  IMPRESSION:  1.  Irregularly marginated  lesion abutting the right hemidiaphragm within the right lower lobe highly suspicious for primary lung carcinoma. 2.  Small nodular opacities within the right lower lobe, right middle lobe, and right upper lobe suspicious for ipsilateral metastases.  Original Report Authenticated By: Juline Patch, M.D.   Recent Lab Findings: Lab Results  Component Value Date   WBC 4.5 02/18/2012   HGB 11.7* 02/18/2012   HCT 35.0* 02/18/2012   PLT 247 02/18/2012   GLUCOSE 102* 02/18/2012   ALT 15 02/18/2012   AST 18 02/18/2012   NA 143 02/18/2012   K 4.5 02/18/2012   CL 104 02/18/2012   CREATININE 1.32* 02/18/2012   BUN 42* 02/18/2012   CO2 26 02/18/2012   INR 0.95 02/18/2012   PFTS's:FEV1 2.43  134% DLCO            14   58%  Assessment / Plan:     Patient is an 78 year old female with history of smoking in the past who presents with incidental finding of 3 separate lesions in the right lung, one complex and on the diaphragm in 2 separate 7-9 mm lesions. She also has chronic renal insufficiency with the creatinine ranging between 1.96 and 1.71. The irregularly shaped lesion on the diaphragm has an SUV of 4.6 suspicious for carcinoma of the lung. There is also some increased uptake in the right hilum but without any matching enlarged lymph node. To further stage and obtain a tissue diagnosis I recommend to the patient that we proceed with bronchoscopy,  EBUS, and ENB. This was done 10 days ago and were unable to bx the lung lesion, hilar node by ebus was negative. We now plan to proceed with rt VATS and lung  resection.   The goals risks and alternatives of the planned surgical procedure right lung resection have been discussed with the patient in detail. The risks of the procedure including death, infection, stroke, myocardial infarction, bleeding, blood transfusion have all been discussed specifically.  I have quoted April Burns a 2% of perioperative mortality and a complication rate as high as 15 %. The patient's questions have been answered.April Burns is willing  to proceed with the planned procedure.   Delight Ovens MD  Beeper [phone] Office [phone] 02/19/2012 7:14 AM

## 2012-02-19 NOTE — Brief Op Note (Signed)
02/19/2012  10:40 AM  PATIENT:  Delorise Jackson  78 y.o. female  PRE-OPERATIVE DIAGNOSIS:  RIGHT LUNG MASS  POST-OPERATIVE DIAGNOSIS:  RIGHT LUNG MASS  PROCEDURE:  Procedure(s) (LRB): VIDEO ASSISTED THORACOSCOPY (VATS),RIght Mini thoracotomy,WEDGE RESECTION (Right)x2, mediastinal lymph node dissection, and On Q placement.  SURGEON:  Surgeon(s) and Role:    * Delight Ovens, MD - Primary  PHYSICIAN ASSISTANT: Doree Fudge PA-C   ANESTHESIA:   general  EBL:  Total I/O In: 1000 [I.V.:1000] Out: 200 [Urine:200]  BLOOD ADMINISTERED:none  DRAINS: Urinary Catheter (Foley), one  Chest Tube(s) in the rt chest     SPECIMEN:  Source of Specimen:  Wedge resection of right lower lobe mass x two  DISPOSITION OF SPECIMEN:  PATHOLOGY.Frozen section consistent with granulomatous disease.  COUNTS CORRECT:  YES  DICTATION: .Dragon Dictation  PLAN OF CARE: Admit to inpatient   PATIENT DISPOSITION:  PACU - hemodynamically stable.   Delay start of Pharmacological VTE agent (>24hrs) due to surgical blood loss or risk of bleeding: yes

## 2012-02-19 NOTE — Anesthesia Postprocedure Evaluation (Signed)
  Anesthesia Post-op Note  Patient: April Burns  Procedure(s) Performed: Procedure(s) (LRB): VIDEO ASSISTED THORACOSCOPY (VATS)/WEDGE RESECTION (Right)  Patient Location: PACU  Anesthesia Type: General  Level of Consciousness: awake, alert  and oriented  Airway and Oxygen Therapy: Patient Spontanous Breathing and Patient connected to nasal cannula oxygen  Post-op Pain: mild  Post-op Assessment: Post-op Vital signs reviewed, Patient's Cardiovascular Status Stable, Respiratory Function Stable, Patent Airway, No signs of Nausea or vomiting and Pain level controlled  Post-op Vital Signs: Reviewed and stable  Complications: No apparent anesthesia complications

## 2012-02-19 NOTE — Anesthesia Procedure Notes (Signed)
Performed by: Jaycob Mcclenton     

## 2012-02-20 ENCOUNTER — Inpatient Hospital Stay (HOSPITAL_COMMUNITY): Payer: Medicare Other

## 2012-02-20 LAB — POCT I-STAT 3, ART BLOOD GAS (G3+)
Acid-Base Excess: 2 mmol/L (ref 0.0–2.0)
Bicarbonate: 26.4 mEq/L — ABNORMAL HIGH (ref 20.0–24.0)
O2 Saturation: 99 %
Patient temperature: 97.8
TCO2: 28 mmol/L (ref 0–100)
pCO2 arterial: 40.2 mmHg (ref 35.0–45.0)
pH, Arterial: 7.424 — ABNORMAL HIGH (ref 7.350–7.400)
pO2, Arterial: 146 mmHg — ABNORMAL HIGH (ref 80.0–100.0)

## 2012-02-20 LAB — CBC
HCT: 28.4 % — ABNORMAL LOW (ref 36.0–46.0)
Hemoglobin: 9.4 g/dL — ABNORMAL LOW (ref 12.0–15.0)
MCH: 33.5 pg (ref 26.0–34.0)
MCHC: 33.1 g/dL (ref 30.0–36.0)
MCV: 101.1 fL — ABNORMAL HIGH (ref 78.0–100.0)

## 2012-02-20 LAB — BASIC METABOLIC PANEL
BUN: 24 mg/dL — ABNORMAL HIGH (ref 6–23)
Calcium: 8.6 mg/dL (ref 8.4–10.5)
Creatinine, Ser: 1.17 mg/dL — ABNORMAL HIGH (ref 0.50–1.10)
GFR calc non Af Amer: 42 mL/min — ABNORMAL LOW (ref >90)
Glucose, Bld: 174 mg/dL — ABNORMAL HIGH (ref 70–99)
Sodium: 138 mEq/L (ref 135–145)

## 2012-02-20 MED ORDER — DEXTROSE-NACL 5-0.9 % IV SOLN: INTRAVENOUS | Status: DC

## 2012-02-20 NOTE — Progress Notes (Signed)
Patient ID: April Burns, female   DOB: 02-10-1927, 78 y.o.   MRN: [id number] TCTS DAILY PROGRESS NOTE                   [address]            [place] [address]          [phone]      1 Day Post-Op Procedure(s) (LRB): VIDEO ASSISTED THORACOSCOPY (VATS)/WEDGE RESECTION (Right)  Total Length of Stay:  LOS: 1 day   Subjective: Good respiratory effort comfortable   Objective: Vital signs in last 24 hours: Temp:  [97 F (36.1 C)-98.3 F (36.8 C)] 97.9 F (36.6 C) (02/20 0741) Pulse Rate:  [55-76] 62  (02/20 0800) Cardiac Rhythm:  [-] Normal sinus rhythm (02/20 0800) Resp:  [3-20] 12  (02/20 0800) BP: (88-115)/(30-64) 115/47 mmHg (02/20 0800) SpO2:  [96 %-100 %] 96 % (02/20 0800) Arterial Line BP: (77-127)/(31-51) 120/47 mmHg (02/20 0800) Weight:  [110 lb (49.896 kg)-120 lb 4.8 oz (54.568 kg)] 120 lb 4.8 oz (54.568 kg) (02/20 0700)  Filed Weights   02/19/12 1400 02/20/12 0700  Weight: 110 lb (49.896 kg) 120 lb 4.8 oz (54.568 kg)    Weight change:    Hemodynamic parameters for last 24 hours:    Intake/Output from previous day: 02/19 0701 - 02/20 0700 In: 4832 [I.V.:4632; IV Piggyback:200] Out: 2290 [Urine:1890; Blood:50; Chest Tube:350]  Intake/Output this shift: Total I/O In: 177 [I.V.:127; IV Piggyback:50] Out: 50 [Urine:50]  Current Meds: Scheduled Meds:   . acetaminophen  1,000 mg Intravenous Q6H  . allopurinol  100 mg Oral Daily  . amLODipine  10 mg Oral Daily  . bisacodyl  10 mg Oral Daily  . bupivacaine 0.5 % ON-Q pump SINGLE CATH 400 mL  400 mL Other To OR  . calcium-vitamin D  1 tablet Oral BID  . cefUROXime (ZINACEF)  IV  1.5 g Intravenous Q12H  . fentaNYL   Intravenous Q4H  . fluticasone  2 spray Each Nare Daily  . folic acid  1 mg Oral Daily  . HYDROmorphone       Continuous Infusions:   . bupivacaine ON-Q pain pump    . dextrose 5 % and 0.9% NaCl 125 mL/hr at 02/20/12 0545   PRN Meds:.colchicine, diphenhydrAMINE,  diphenhydrAMINE, naloxone, ondansetron (ZOFRAN) IV, potassium chloride, senna-docusate, sodium chloride, traMADol, DISCONTD: 0.9 % irrigation (POUR BTL), DISCONTD: hemostatic agents, DISCONTD: HYDROmorphone, DISCONTD: meperidine, DISCONTD: morphine, DISCONTD: ondansetron (ZOFRAN) IV, DISCONTD: ondansetron (ZOFRAN) IV  General appearance: alert, cooperative and no distress Neurologic: intact Heart: regular rate and rhythm, S1, S2 normal, no murmur, click, rub or gallop Lungs: clear to auscultation bilaterally Abdomen: soft, non-tender; bowel sounds normal; no masses,  no organomegaly Extremities: extremities normal, atraumatic, no cyanosis or edema no air leak  Lab Results: CBC: Basename 02/20/12 0405 02/18/12 0852  WBC 7.9 4.5  HGB 9.4* 11.7*  HCT 28.4* 35.0*  PLT 195 247   BMET:  Basename 02/20/12 0405 02/18/12 0852  NA 138 143  K 4.1 4.5  CL 106 104  CO2 26 26  GLUCOSE 174* 102*  BUN 24* 42*  CREATININE 1.17* 1.32*  CALCIUM 8.6 10.4    PT/INR:  Basename 02/18/12 0852  LABPROT 12.9  INR 0.95   Radiology: Dg Chest 2 View  02/18/2012  *RADIOLOGY REPORT*  Clinical Data: Hypertension.  Right lung mass.  CHEST - 2 VIEW  Comparison: Multiple prior exams. 02/01/2012 most recent chest.  Findings:  Normal cardiac and mediastinal silhouette.  COPD.  Right basilar lung mass is poorly visualized, possibly representing resolving post biopsy change.  No effusion or pneumothorax.  Normal osseous structures.  IMPRESSION: COPD, no active infiltrates. Right lung lesion poorly visualized compared to most recent prior chest.  Original Report Authenticated By: Elsie Stain, M.D.   Dg Chest Portable 1 View  02/19/2012  *RADIOLOGY REPORT*  Clinical Data: Postop chest tube placement  PORTABLE CHEST - 1 VIEW  Comparison: 02/18/2012  Findings: Right apical chest tube.  No pneumothorax is seen.  Right basilar opacity, possibly postsurgical.  Left lung is essentially clear.  Cardiomediastinal silhouette  is within normal limits.  Right IJ venous catheter with its tip in the lower SVC.  IMPRESSION: Right apical chest tube.  No pneumothorax is seen.  Right basilar opacity, possibly postsurgical  Original Report Authenticated By: Charline Bills, M.D.     Assessment/Plan: S/P Procedure(s) (LRB): VIDEO ASSISTED THORACOSCOPY (VATS)/WEDGE RESECTION (Right) Stable past op Chest tube to water seal To 3300 if beds Decrease iv rate Has good pain control History of renal insufficiency today cr stable 1.17     Delight Ovens MD  Beeper [phone] Office [phone] 02/20/2012 9:10 AM

## 2012-02-20 NOTE — Progress Notes (Signed)
UR Completed.  Dinesh Ulysse Jane [phone] 02/20/2012  

## 2012-02-20 NOTE — Op Note (Signed)
NAME:  April Burns, LACSON NO.:  [id number]  MEDICAL RECORD NO.:  [id number]  LOCATION:  2303                         FACILITY:  [facility]  PHYSICIAN:  Sheliah Plane, MD    DATE OF BIRTH:  1927/09/25  DATE OF PROCEDURE:  02/19/2012 DATE OF DISCHARGE:                              OPERATIVE REPORT   PREOPERATIVE DIAGNOSIS:  Right lower lobe lung mass.  POSTOPERATIVE DIAGNOSIS:  Granulomatous disease by frozen section.  PROCEDURE PERFORMED:  Right video-assisted thoracoscopy, mini thoracotomy, wedge resection of lung nodules right lower lobe.  SURGEON:  Sheliah Plane, MD  FIRST ASSISTANT:  Doree Fudge, PA  BRIEF HISTORY:  The patient is an 78 year old female who has a distant history of smoking, who presented to her primary care doctor with abdominal discomfort and weight loss.  A CT scan of the abdomen was performed with no abnormalities other than a complex lung nodule at the base of the right lung.  CT of the chest and ultimately a PET scan was suspicious, but not diagnostic of malignancy in the right lower lobe. There were also some non-avid small lung nodules of 7-8 mm in the right lower lobe, distinct from one on the diaphragm.  Bronchoscopy, EBUS of a nonenlarged hilar node that was very slightly hypermetabolic was performed.  Electromagnetic navigational bronchoscopy was also performed preoperatively, though no definitive diagnosis could be made.  With the lack of tissue diagnosis, video-assisted thoracoscopy and wedge resection was recommended to the patient, who agreed and signed informed consent.  DESCRIPTION OF PROCEDURE:  The patient underwent general endotracheal anesthesia without incident.  A blocker tube was placed into the trachea.  The right lung was deflated.  Time-out confirming side was performed.  The patient was turned in lateral decubitus position with the right side up.  Initially, a small port incision was made low in the chest  on the midaxillary line.  With some manipulation, the lung was finally deflated.  Careful examination of the chest showed no obvious pleural disease.  The areas in the right lower lobe were not adherent to the diaphragm.  A small thoracotomy incision was made to palpate the lung.  The more posterior lesion which was small was wedged out.  In addition, the more complex lesion along the lower surface of the right lower lobe on the diaphragm was also wedged out.  A total of 3 lesions, one in the first wedge and two in the second were submitted to pathology, and all confirmed as granulomatous disease without evidence of malignancy.  There was also a markedly enlarged lymph node along the eighth area that was removed and showed no evidence of malignancy.  A single 28 chest tube was placed.  The port sites were closed with absorbable suture in a subcutaneous stitch.  The small thoracotomy incision was closed with interrupted 0 Vicryl in the deep fascial layers and running 2-0 Vicryl, and 3-0 subcuticular stitch in skin edges. Prior to the closure of the chest, a small incision was made more posteriorly, and through this a dilator with sheath for On-Q catheter was placed subpleurally along the posterior ribs to help in pain control.  This was secured in place.  The patient was then awakened in the operating room, extubated and transferred to the recovery room, having tolerated the procedure without obvious complication.  Blood loss was  minimal.     Sheliah Plane, MD     EG/MEDQ  D:  02/20/2012  T:  02/20/2012  Job:  [id number]  cc:   [name], M.D.

## 2012-02-21 ENCOUNTER — Inpatient Hospital Stay (HOSPITAL_COMMUNITY): Payer: Medicare Other

## 2012-02-21 LAB — COMPREHENSIVE METABOLIC PANEL
ALT: 13 U/L (ref 0–35)
AST: 19 U/L (ref 0–37)
Calcium: 9.5 mg/dL (ref 8.4–10.5)
Creatinine, Ser: 1.12 mg/dL — ABNORMAL HIGH (ref 0.50–1.10)
GFR calc Af Amer: 51 mL/min — ABNORMAL LOW (ref >90)
Glucose, Bld: 99 mg/dL (ref 70–99)
Sodium: 139 mEq/L (ref 135–145)
Total Protein: 6.4 g/dL (ref 6.0–8.3)

## 2012-02-21 LAB — CBC
MCH: 33.2 pg (ref 26.0–34.0)
MCHC: 32.2 g/dL (ref 30.0–36.0)
MCV: 103.3 fL — ABNORMAL HIGH (ref 78.0–100.0)
Platelets: 240 10*3/uL (ref 150–400)

## 2012-02-21 MED ORDER — DIPHENHYDRAMINE HCL 50 MG/ML IJ SOLN: 12.5000 mg | Freq: Four times a day (QID) | INTRAMUSCULAR | Status: DC | PRN

## 2012-02-21 MED ORDER — ONDANSETRON HCL 4 MG/2ML IJ SOLN: 4.0000 mg | Freq: Four times a day (QID) | INTRAMUSCULAR | Status: DC | PRN

## 2012-02-21 MED ORDER — FENTANYL 10 MCG/ML IV SOLN
INTRAVENOUS | Status: DC
  Administered 2012-02-21: 30 ug via INTRAVENOUS
  Administered 2012-02-21: 20 ug via INTRAVENOUS
  Administered 2012-02-21: 10 ug via INTRAVENOUS
  Administered 2012-02-21: via INTRAVENOUS
  Administered 2012-02-22: 50 ug via INTRAVENOUS
  Administered 2012-02-22: 30 ug via INTRAVENOUS
  Filled 2012-02-21: qty 50

## 2012-02-21 MED ORDER — BOOST / RESOURCE BREEZE PO LIQD
1.0000 | Freq: Two times a day (BID) | ORAL | Status: DC
  Administered 2012-02-22: 1 via ORAL

## 2012-02-21 MED ORDER — DIPHENHYDRAMINE HCL 12.5 MG/5ML PO ELIX
12.5000 mg | ORAL_SOLUTION | Freq: Four times a day (QID) | ORAL | Status: DC | PRN
  Filled 2012-02-21: qty 5

## 2012-02-21 MED ORDER — NALOXONE HCL 0.4 MG/ML IJ SOLN: 0.4000 mg | INTRAMUSCULAR | Status: DC | PRN

## 2012-02-21 MED ORDER — SODIUM CHLORIDE 0.9 % IJ SOLN: 9.0000 mL | INTRAMUSCULAR | Status: DC | PRN

## 2012-02-21 NOTE — Progress Notes (Signed)
Subjective: The patient is doing well postoperatively and appreciates care and relieved that her right lung lesion has so far been reported as benign. Daughter, Billey Gosling, and I are concerned about the weight loss without obvious neoplasm at this point or diabetes or hyperthyroidism et Karie Soda. Concerned about possible anorexia secondary to patient's own concerns about weight gain, although the patient thinks that her weight loss is secondary to increased stress this past summer with having entertained many visitors to her home in the [facility] and not "getting a break" all summer.  Objective: Weight change: 5.104 kg (11 lb 4.1 oz)  Intake/Output Summary (Last 24 hours) at 02/21/12 1446 Last data filed at 02/21/12 0744  Gross per 24 hour  Intake    280 ml  Output    605 ml  Net   -325 ml   Filed Vitals:   02/20/12 2342 02/21/12 0354 02/21/12 0700 02/21/12 1136  BP: 142/54 145/62    Pulse: 69     Temp: 98.7 F (37.1 C) 98.7 F (37.1 C)    TempSrc: Oral Oral Oral Oral  Resp:      Height:      Weight:      SpO2: 96% 90%      Lab Results:  Basename 02/21/12 0820 02/20/12 0405  NA 139 138  K 4.1 4.1  CL 105 106  CO2 27 26  GLUCOSE 99 174*  BUN 15 24*  CREATININE 1.12* 1.17*  CALCIUM 9.5 8.6  MG -- --  PHOS -- --    Basename 02/21/12 0820  AST 19  ALT 13  ALKPHOS 67  BILITOT 0.6  PROT 6.4  ALBUMIN 3.0*   No results found for this basename: LIPASE:2,AMYLASE:2 in the last 72 hours  Basename 02/21/12 0820 02/20/12 0405  WBC 11.2* 7.9  NEUTROABS -- --  HGB 11.0* 9.4*  HCT 34.2* 28.4*  MCV 103.3* 101.1*  PLT 240 195   No results found for this basename: CKTOTAL:3,CKMB:3,CKMBINDEX:3,TROPONINI:3 in the last 72 hours No components found with this basename: POCBNP:3 No results found for this basename: DDIMER:2 in the last 72 hours No results found for this basename: HGBA1C:2 in the last 72 hours No results found for this basename:  CHOL:2,HDL:2,LDLCALC:2,TRIG:2,CHOLHDL:2,LDLDIRECT:2 in the last 72 hours No results found for this basename: TSH,T4TOTAL,FREET3,T3FREE,THYROIDAB in the last 72 hours No results found for this basename: VITAMINB12:2,FOLATE:2,FERRITIN:2,TIBC:2,IRON:2,RETICCTPCT:2 in the last 72 hours  Studies/Results: Dg Chest Port 1 View  02/21/2012  *RADIOLOGY REPORT*  Clinical Data: Chest tube evaluation.  PORTABLE CHEST - 1 VIEW  Comparison: 02/20/2012 and02/18/2013.  PET CT 01/22/2012 and CT chest 01/10/2012.  Findings: Trachea is midline.  Heart size normal.  Right IJ central line tip projects over the SVC.  Right chest tube terminates near the right apex.  No pneumothorax.  Right basilar airspace consolidation is seen with left basilar subsegmental atelectasis. There may be a small right pleural effusion.  Difficult to exclude tiny left pleural effusion.  IMPRESSION:  1.  Right basilar airspace consolidation may be due to developing pneumonia. 2.  Left basilar atelectasis. 3.  Small right pleural effusion.  Question tiny left pleural effusion. 4.  No pneumothorax.  Original Report Authenticated By: Reyes Ivan, M.D.   Dg Chest Port 1 View  02/20/2012  *RADIOLOGY REPORT*  Clinical Data: Postop resection of right lower lobe lung cancer.  PORTABLE CHEST - 1 VIEW 02/20/2012 0645 hours:  Comparison: Portable chest x-ray yesterday and two-view chest x-ray 02/18/2012.  Findings: Right chest tube  in place with no pneumothorax.  Right jugular central venous catheter tip in the SVC.  Interval worsening of atelectasis at the right lung base.  Small bilateral pleural effusions, unchanged.  Left lung essentially clear apart from minimal linear atelectasis in the left upper lobes centrally. Cardiac silhouette normal in size for the AP portable technique, unchanged.  IMPRESSION: No pneumothorax with right chest tube in place.  Worsening right basilar atelectasis.  Stable small bilateral pleural effusion and minimal linear  atelectasis in the left upper lobe.  Original Report Authenticated By: Arnell Sieving, M.D.   Medications: Scheduled Meds:   . allopurinol  100 mg Oral Daily  . amLODipine  10 mg Oral Daily  . bisacodyl  10 mg Oral Daily  . calcium-vitamin D  1 tablet Oral BID  . fentaNYL   Intravenous Q4H  . fluticasone  2 spray Each Nare Daily  . folic acid  1 mg Oral Daily  . DISCONTD: fentaNYL   Intravenous Q4H   Continuous Infusions:   . bupivacaine ON-Q pain pump    . dextrose 5 % and 0.9% NaCl 20 mL/hr at 02/21/12 0701   PRN Meds:.colchicine, diphenhydrAMINE, diphenhydrAMINE, naloxone, ondansetron (ZOFRAN) IV, potassium chloride, senna-docusate, sodium chloride, traMADol, DISCONTD: diphenhydrAMINE, DISCONTD: diphenhydrAMINE, DISCONTD: naloxone, DISCONTD: ondansetron (ZOFRAN) IV, DISCONTD: sodium chloride  Assessment/Plan:  Nutrition consult for maintenance of weight   LOS: 2 days   April Burns 02/21/2012, 2:46 PM

## 2012-02-21 NOTE — Progress Notes (Signed)
INITIAL ADULT NUTRITION ASSESSMENT Date: 02/21/2012   Time: 3:28 PM  Reason for Assessment: MD Consult--recent weight loss  ASSESSMENT: Female 78 y.o.  Dx: 3 separate lesions in the right lung, one complex and on the diaphragm in 2 separate 7-9 mm lesions; S/P VATS, wedge resection, mediastinal lymph node dissection 2/19.  Hx:  Past Medical History  Diagnosis Date  . Hypercholesterolemia   . Gout   . Allergic rhinitis   . Restless leg syndrome   . HTN (hypertension)   . Diverticulosis     no current flare up  . Hearing loss   . Osteopenia   . Cancer   . Lung mass     R lung  . Arthritis     gout    Related Meds:  Scheduled Meds:   . allopurinol  100 mg Oral Daily  . amLODipine  10 mg Oral Daily  . bisacodyl  10 mg Oral Daily  . calcium-vitamin D  1 tablet Oral BID  . fentaNYL   Intravenous Q4H  . fluticasone  2 spray Each Nare Daily  . folic acid  1 mg Oral Daily  . DISCONTD: fentaNYL   Intravenous Q4H   Continuous Infusions:   . bupivacaine ON-Q pain pump    . dextrose 5 % and 0.9% NaCl 20 mL/hr at 02/21/12 0701   PRN Meds:.colchicine, diphenhydrAMINE, diphenhydrAMINE, naloxone, ondansetron (ZOFRAN) IV, potassium chloride, senna-docusate, sodium chloride, traMADol, DISCONTD: diphenhydrAMINE, DISCONTD: diphenhydrAMINE, DISCONTD: naloxone, DISCONTD: ondansetron (ZOFRAN) IV, DISCONTD: sodium chloride   Ht: 5\' 3"  (160 cm)  Wt: 110 lb on admission (50 kg)  Ideal Wt: 52.3 kg % Ideal Wt: 96%  Wt Readings from Last 12 Encounters:  02/20/12 121 lb 4.1 oz (55 kg)  02/20/12 121 lb 4.1 oz (55 kg)  02/18/12 114 lb 3.2 oz (51.801 kg)  02/12/12 110 lb (49.896 kg)  02/06/12 111 lb (50.349 kg)  01/29/12 115 lb 8.3 oz (52.4 kg)  01/23/12 111 lb (50.349 kg)  01/17/12 111 lb (50.349 kg)   Usual Wt: 123 lb per patient 6 months ago % Usual Wt: 89%  Body mass index is 21.48 kg/(m^2).  Food/Nutrition Related Hx: 13 lb weight loss over the past 6 months.    Labs:    CMP     Component Value Date/Time   NA 139 02/21/2012 0820   K 4.1 02/21/2012 0820   CL 105 02/21/2012 0820   CO2 27 02/21/2012 0820   GLUCOSE 99 02/21/2012 0820   BUN 15 02/21/2012 0820   CREATININE 1.12* 02/21/2012 0820   CALCIUM 9.5 02/21/2012 0820   PROT 6.4 02/21/2012 0820   ALBUMIN 3.0* 02/21/2012 0820   AST 19 02/21/2012 0820   ALT 13 02/21/2012 0820   ALKPHOS 67 02/21/2012 0820   BILITOT 0.6 02/21/2012 0820   GFRNONAA 44* 02/21/2012 0820   GFRAA 51* 02/21/2012 0820    Intake/Output Summary (Last 24 hours) at 02/21/12 1549 Last data filed at 02/21/12 1400  Gross per 24 hour  Intake    260 ml  Output    605 ml  Net   -345 ml    Diet Order: Heart Healthy  IVF:    bupivacaine ON-Q pain pump   dextrose 5 % and 0.9% NaCl Last Rate: 20 mL/hr at 02/21/12 0701    Estimated Nutritional Needs:   Kcal: 1500-1600 Protein: 65-75 grams Fluid: 1.5-1.7 liters  Patient reports weight loss is related to decreased oral intake last summer when she cooked for a lot  of guests at her vacation home and did not want to eat the food that she had spent so much time preparing.  Weight gain since admission could be related to fluid retention.  Intake since admission has been fair per patient.  Does not like Ensure supplements, but had been drinking them at home.  Agreed to try [employer] and [employer] to increase intake.  Patient with moderate malnutrition in the context of social/environmental circumstances, given 11% weight loss in 6 months and intake </=75% of estimated energy requirement for >/=3 months  NUTRITION DIAGNOSIS: -Inadequate oral intake (NI-2.1).  Status: Ongoing  RELATED TO: recent poor appetite  AS EVIDENCE BY: 11% weight loss in 6 months  MONITORING/EVALUATION(Goals): Goal:  Oral intake of meals and snacks to meet 90-100% of nutrition needs.  Monitor:  Weight trend, labs, PO intake.  EDUCATION NEEDS: -Education needs addressed, discussed ways to increase intake of  protein and calories.  INTERVENTION:  Resource Breeze (orange) BID.  Magic Cup BID on meal trays.  Dietitian #:  [phone]  DOCUMENTATION CODES Per approved criteria  -Moderate malnutrition in the context of social or environmental circumstances    April Burns 02/21/2012, 3:28 PM

## 2012-02-21 NOTE — Progress Notes (Signed)
Patient ID: April Burns, female   DOB: October 28, 1927, 78 y.o.   MRN: [id number] TCTS DAILY PROGRESS NOTE                   [address]            [name] [address]          [phone]      2 Days Post-Op Procedure(s) (LRB): VIDEO ASSISTED THORACOSCOPY (VATS)/WEDGE RESECTION (Right)  Total Length of Stay:  LOS: 2 days   Subjective  Feels well, no sob  Objective: Vital signs in last 24 hours: Temp:  [97.7 F (36.5 C)-98.8 F (37.1 C)] 98.7 F (37.1 C) (02/21 0354) Pulse Rate:  [61-91] 69  (02/20 2342) Cardiac Rhythm:  [-] Normal sinus rhythm (02/21 0744) Resp:  [8-21] 16  (02/20 2000) BP: (118-145)/(45-64) 145/62 mmHg (02/21 0354) SpO2:  [90 %-100 %] 90 % (02/21 0354) Arterial Line BP: (106-129)/(43-52) 129/52 mmHg (02/20 1000) Weight:  [121 lb 4.1 oz (55 kg)] 121 lb 4.1 oz (55 kg) (02/20 1533)  Filed Weights   02/19/12 1400 02/20/12 0700 02/20/12 1533  Weight: 110 lb (49.896 kg) 120 lb 4.8 oz (54.568 kg) 121 lb 4.1 oz (55 kg)    Weight change: 11 lb 4.1 oz (5.104 kg)       Intake/Output from previous day: 02/20 0701 - 02/21 0700 In: 987 [P.O.:320; I.V.:617; IV Piggyback:50] Out: 1055 [Urine:975; Chest Tube:80]  Intake/Output this shift: Total I/O In: 20 [I.V.:20] Out: -   Current Meds: Scheduled Meds:   . allopurinol  100 mg Oral Daily  . amLODipine  10 mg Oral Daily  . bisacodyl  10 mg Oral Daily  . calcium-vitamin D  1 tablet Oral BID  . fentaNYL   Intravenous Q4H  . fluticasone  2 spray Each Nare Daily  . folic acid  1 mg Oral Daily   Continuous Infusions:   . bupivacaine ON-Q pain pump    . dextrose 5 % and 0.9% NaCl 20 mL/hr at 02/21/12 0701  . DISCONTD: dextrose 5 % and 0.9% NaCl 125 mL/hr at 02/20/12 0545   PRN Meds:.colchicine, diphenhydrAMINE, diphenhydrAMINE, naloxone, ondansetron (ZOFRAN) IV, potassium chloride, senna-docusate, sodium chloride, traMADol  General appearance: alert, cooperative and no  distress Neurologic: intact Heart: regular rate and rhythm, S1, S2 normal, no murmur, click, rub or gallop Lungs: clear to auscultation bilaterally no air leak  Lab Results: CBC: Basename 02/20/12 0405 02/18/12 0852  WBC 7.9 4.5  HGB 9.4* 11.7*  HCT 28.4* 35.0*  PLT 195 247   BMET:  Basename 02/20/12 0405 02/18/12 0852  NA 138 143  K 4.1 4.5  CL 106 104  CO2 26 26  GLUCOSE 174* 102*  BUN 24* 42*  CREATININE 1.17* 1.32*  CALCIUM 8.6 10.4    PT/INR:  Basename 02/18/12 0852  LABPROT 12.9  INR 0.95   Radiology: Dg Chest Port 1 View  02/21/2012  *RADIOLOGY REPORT*  Clinical Data: Chest tube evaluation.  PORTABLE CHEST - 1 VIEW  Comparison: 02/20/2012 and02/18/2013.  PET CT 01/22/2012 and CT chest 01/10/2012.  Findings: Trachea is midline.  Heart size normal.  Right IJ central line tip projects over the SVC.  Right chest tube terminates near the right apex.  No pneumothorax.  Right basilar airspace consolidation is seen with left basilar subsegmental atelectasis. There may be a small right pleural effusion.  Difficult to exclude tiny left pleural effusion.  IMPRESSION:  1.  Right basilar airspace  consolidation may be due to developing pneumonia. 2.  Left basilar atelectasis. 3.  Small right pleural effusion.  Question tiny left pleural effusion. 4.  No pneumothorax.  Original Report Authenticated By: Reyes Ivan, M.D.   Dg Chest Port 1 View  02/20/2012  *RADIOLOGY REPORT*  Clinical Data: Postop resection of right lower lobe lung cancer.  PORTABLE CHEST - 1 VIEW 02/20/2012 0645 hours:  Comparison: Portable chest x-ray yesterday and two-view chest x-ray 02/18/2012.  Findings: Right chest tube in place with no pneumothorax.  Right jugular central venous catheter tip in the SVC.  Interval worsening of atelectasis at the right lung base.  Small bilateral pleural effusions, unchanged.  Left lung essentially clear apart from minimal linear atelectasis in the left upper lobes centrally.  Cardiac silhouette normal in size for the AP portable technique, unchanged.  IMPRESSION: No pneumothorax with right chest tube in place.  Worsening right basilar atelectasis.  Stable small bilateral pleural effusion and minimal linear atelectasis in the left upper lobe.  Original Report Authenticated By: Arnell Sieving, M.D.   Dg Chest Portable 1 View  02/19/2012  *RADIOLOGY REPORT*  Clinical Data: Postop chest tube placement  PORTABLE CHEST - 1 VIEW  Comparison: 02/18/2012  Findings: Right apical chest tube.  No pneumothorax is seen.  Right basilar opacity, possibly postsurgical.  Left lung is essentially clear.  Cardiomediastinal silhouette is within normal limits.  Right IJ venous catheter with its tip in the lower SVC.  IMPRESSION: Right apical chest tube.  No pneumothorax is seen.  Right basilar opacity, possibly postsurgical  Original Report Authenticated By: Charline Bills, M.D.     Assessment/Plan: S/P Procedure(s) (LRB): VIDEO ASSISTED THORACOSCOPY (VATS)/WEDGE RESECTION (Right) Mobilize d/c tubes/lines Labs pending    Delight Ovens MD  Beeper [phone] Office [phone] 02/21/2012 8:43 AM

## 2012-02-22 ENCOUNTER — Inpatient Hospital Stay (HOSPITAL_COMMUNITY): Payer: Medicare Other

## 2012-02-22 LAB — TISSUE CULTURE
Culture: NO GROWTH
Gram Stain: NONE SEEN

## 2012-02-22 MED ORDER — TRAMADOL HCL 50 MG PO TABS: 50.0000 mg | ORAL_TABLET | ORAL | Status: AC | PRN

## 2012-02-22 MED ORDER — SODIUM CHLORIDE 0.9 % IJ SOLN
INTRAMUSCULAR | Status: AC
  Filled 2012-02-22: qty 20

## 2012-02-22 NOTE — Progress Notes (Addendum)
3 Days Post-Op Procedure(s) (LRB): VIDEO ASSISTED THORACOSCOPY (VATS)/WEDGE RESECTION (Right)  Subjective: Patient without complaints.  Objective: Vital signs in last 24 hours: Patient Vitals for the past 24 hrs:  BP Temp Temp src Pulse Resp SpO2  02/22/12 0825 - - - - 23  95 %  02/22/12 0800 105/68 mmHg 98.3 F (36.8 C) Oral 67  19  92 %  02/22/12 0517 - - - - 14  95 %  02/22/12 0400 126/57 mmHg 98.9 F (37.2 C) Oral - - -  02/21/12 2343 128/60 mmHg 99.1 F (37.3 C) Oral - - -  02/21/12 2341 - - - - 13  93 %  02/21/12 2337 - - - - 14  96 %  02/21/12 1954 - - - - 21  95 %  02/21/12 1953 - - - - 21  95 %  02/21/12 1951 - 98.2 F (36.8 C) Oral - - -    Current Weight  02/20/12 121 lb 4.1 oz (55 kg)        Intake/Output from previous day: 02/21 0701 - 02/22 0700 In: 499.7 [I.V.:499.7] Out: 500 [Urine:500]   Physical Exam:  Cardiovascular: RRR, no murmurs, gallops, or rubs. Pulmonary: Slightly decreased at right base; no rales, wheezes, or rhonchi. Abdomen: Soft, non tender, bowel sounds present. Wounds: Clean and dry.  No erythema or signs of infection.  Lab Results: CBC: Basename 02/21/12 0820 02/20/12 0405  WBC 11.2* 7.9  HGB 11.0* 9.4*  HCT 34.2* 28.4*  PLT 240 195   BMET:  Basename 02/21/12 0820 02/20/12 0405  NA 139 138  K 4.1 4.1  CL 105 106  CO2 27 26  GLUCOSE 99 174*  BUN 15 24*  CREATININE 1.12* 1.17*  CALCIUM 9.5 8.6    PT/INR: No results found for this basename: LABPROT,INR in the last 72 hours ABG:  INR: Will add last result for INR, ABG once components are confirmed Will add last 4 CBG results once components are confirmed  Assessment/Plan:   1.  Pulmonary - Encourage incentive spirometer. CXR this am shows improving RLL aeration, small b.l pleural effusions, and decreasing pulmonary vascular congestion. Check CXR in am. 2.Probable discharge in am.   ZIMMERMAN,DONIELLE MPA-C 02/22/2012   xray reviewed Final path report  reviewed, no malignancy  Home in am I have seen and examined Delorise Jackson and agree with the above assessment  and plan.  Delight Ovens MD Beeper [phone] Office [phone] 02/22/2012 7:23 PM

## 2012-02-22 NOTE — Discharge Summary (Signed)
[address]            [name] [address]          [phone]         Discharge Summary  Name: April Burns DOB: [birth year] 78 y.o. MRN: [id number]  Admission Date: 02/19/2012 Discharge Date:    Admitting Diagnosis:  Right lower lobe lung mass  Discharge Diagnosis:   Granulomatous disease right lower lobe by frozen section, final pathology pending  Hypercholesterolemia  Gout  Allergic rhinitis  Hypertension  Diverticulosis  Osteopenia  Arthritis  restless leg syndrome  Prior history of tobacco use  Procedures: Procedure(s): RIGHT VIDEO ASSISTED THORACOSCOPY (VATS)/MINI-THORACOTOMY, RIGHT LOWER LOBE WEDGE RESECTION on 02/19/2012   HPI:  The patient is a 78 y.o. female who presented to her primary care doctor with abdominal discomfort and weight loss. A CT scan of the abdomen was performed with no abnormalities other than a complex lung nodule at the base of the right lung. CT of the chest and ultimately a PET scan was suspicious, but not diagnostic of malignancy in the right lower lobe. There were also some non-avid small lung nodules of 7-8 mm in the right lower lobe, distinct from one on the diaphragm. She was referred to Dr. Tyrone Sage for surgical evaluation. Bronchoscopy, EBUS of a nonenlarged hilar node that was very slightly hypermetabolic was performed. Electromagnetic navigational bronchoscopy was also performed preoperatively, though no definitive diagnosis could be made. With the lack of tissue diagnosis, video-assisted thoracoscopy and wedge resection was recommended to the patient. All risks, benefits and alternatives of surgery were explained in detail, and the patient agreed to proceed.  Hospital Course:  The patient was admitted to [facility] on 02/19/2012.  The patient was taken to the operating room and underwent the above procedure.    The postoperative course has been uneventful. The final pathology remains  pending at the time of this dictation, however an intraoperative frozen section confirmed granulomatous disease with no evidence of malignancy. Her chest tubes have been removed in the standard fashion and her followup chest x-rays have remained stable. She is ambulating in halls without difficulty. She is tolerating a regular diet. Her pain is controlled with by mouth pain medications. We anticipate discharge home within the next 24 hours provided no acute changes occur.   Recent vital signs:  Filed Vitals:   02/22/12 1300  BP:   Pulse:   Temp:   Resp: 15   Recent laboratory studies:  CBC: Basename 02/21/12 0820 02/20/12 0405  WBC 11.2* 7.9  HGB 11.0* 9.4*  HCT 34.2* 28.4*  PLT 240 195   BMET:  Basename 02/21/12 0820 02/20/12 0405  NA 139 138  K 4.1 4.1  CL 105 106  CO2 27 26  GLUCOSE 99 174*  BUN 15 24*  CREATININE 1.12* 1.17*  CALCIUM 9.5 8.6    PT/INR: No results found for this basename: LABPROT,INR in the last 72 hours  Discharge Medications:   Medication List  As of 02/22/2012  3:02 PM   TAKE these medications         acetaminophen 325 MG tablet   Commonly known as: TYLENOL   Take 650 mg by mouth every 6 (six) hours as needed. For pain      allopurinol 100 MG tablet   Commonly known as: ZYLOPRIM   Take 100 mg by mouth daily.  aspirin 81 MG tablet   Take 81 mg by mouth every Monday, Wednesday, and Friday.      AZOR 10-40 MG per tablet   Generic drug: amLODipine-olmesartan   Take 0.5 tablets by mouth daily.      calcium-vitamin D 500-200 MG-UNIT per tablet   Commonly known as: OSCAL WITH D   Take 1 tablet by mouth 2 (two) times daily.      colchicine 0.6 MG tablet   Take 0.6 mg by mouth 2 (two) times daily as needed. For gout      EPIPEN 2-PAK 0.3 mg/0.3 mL Devi   Generic drug: EPINEPHrine   Inject 0.3 mg into the muscle once.      fluticasone 50 MCG/ACT nasal spray   Commonly known as: FLONASE   Place 2 sprays into the nose daily.      folic  acid 1 MG tablet   Commonly known as: FOLVITE   Take 1 mg by mouth daily.      furosemide 40 MG tablet   Commonly known as: LASIX   Take 40 mg by mouth daily as needed. FOR LEG SWELLING      traMADol 50 MG tablet   Commonly known as: ULTRAM   Take 1-2 tablets (50-100 mg total) by mouth every 4 (four) hours as needed for pain.      VISINE OP   Place 1 drop into both eyes every 6 (six) hours. For dry eyes            Discharge Instructions:  The patient is to refrain from driving, heavy lifting or strenuous activity.  May shower daily and clean incisions with soap and water.  May resume regular diet.  Discharge Orders    Future Appointments: Provider: Department: Dept Phone: Center:   03/06/2012 10:30 AM Delight Ovens, MD [facility] [phone] [facility]      Follow-up Information    Follow up with GERHARDT,EDWARD B, MD on 03/06/2012. (Have a chest x-ray at 10:30, then see MD at 11:30)    Contact information:   [address] [place] [address] [phone]           April Burns H 02/22/2012, 3:02 PM

## 2012-02-22 NOTE — Progress Notes (Signed)
Wasted 37ml of fentanyl with S. Love, RN (witness)

## 2012-02-23 ENCOUNTER — Inpatient Hospital Stay (HOSPITAL_COMMUNITY): Payer: Medicare Other

## 2012-02-23 NOTE — Progress Notes (Signed)
4 Days Post-Op Procedure(s) (LRB): VIDEO ASSISTED THORACOSCOPY (VATS)/WEDGE RESECTION (Right)  Subjective: Patient without complaints.Looking forward to going home.  Objective: Vital signs in last 24 hours: Patient Vitals for the past 24 hrs:  BP Temp Temp src Pulse Resp SpO2  02/23/12 0800 119/49 mmHg - Oral 64  20  97 %  02/23/12 0400 101/49 mmHg 98.7 F (37.1 C) Oral - - -  02/23/12 0000 110/52 mmHg 99 F (37.2 C) Oral - - 99 %  02/22/12 1900 - 98.5 F (36.9 C) Oral - - -  02/22/12 1440 - - - 68  18  95 %  02/22/12 1300 - - - - 15  96 %    Current Weight  02/20/12 121 lb 4.1 oz (55 kg)      Intake/Output from previous day: 02/22 0701 - 02/23 0700 In: 640 [P.O.:600; I.V.:40] Out: -    Physical Exam:  Cardiovascular: RRR, no murmurs, gallops, or rubs. Pulmonary: Slightly decreased at right base; no rales, wheezes, or rhonchi. Abdomen: Soft, non tender, bowel sounds present. Wounds: Clean and dry.  No erythema or signs of infection.  Lab Results: CBC:  Basename 02/21/12 0820  WBC 11.2*  HGB 11.0*  HCT 34.2*  PLT 240   BMET:   Basename 02/21/12 0820  NA 139  K 4.1  CL 105  CO2 27  GLUCOSE 99  BUN 15  CREATININE 1.12*  CALCIUM 9.5    PT/INR: No results found for this basename: LABPROT,INR in the last 72 hours ABG:  INR: Will add last result for INR, ABG once components are confirmed Will add last 4 CBG results once components are confirmed  Assessment/Plan:   1.  Pulmonary - Encourage incentive spirometer. CXR this am shows no pneumothorax and improving, small bilateral pleural effusions.Dr. Tyrone Sage discussed pathology with patient yesterday 2.Discharge.   ZIMMERMAN,DONIELLE MPA-C 02/23/2012   02/23/2012 9:08 AM

## 2012-03-04 ENCOUNTER — Other Ambulatory Visit: Payer: Self-pay | Admitting: Cardiothoracic Surgery

## 2012-03-04 DIAGNOSIS — D381 Neoplasm of uncertain behavior of trachea, bronchus and lung: Secondary | ICD-10-CM

## 2012-03-06 ENCOUNTER — Encounter: Payer: Self-pay | Admitting: Cardiothoracic Surgery

## 2012-03-06 ENCOUNTER — Ambulatory Visit
Admission: RE | Admit: 2012-03-06 | Discharge: 2012-03-06 | Disposition: A | Discharge status: Home / Self Care | Payer: Medicare Other | Source: Ambulatory Visit | Attending: Cardiothoracic Surgery | Admitting: Cardiothoracic Surgery

## 2012-03-06 ENCOUNTER — Other Ambulatory Visit: Payer: Self-pay | Admitting: *Deleted

## 2012-03-06 ENCOUNTER — Ambulatory Visit (INDEPENDENT_AMBULATORY_CARE_PROVIDER_SITE_OTHER): Payer: Self-pay | Admitting: Cardiothoracic Surgery

## 2012-03-06 VITALS — BP 96/54 | HR 78 | Resp 16 | Ht 63.0 in | Wt 105.0 lb

## 2012-03-06 DIAGNOSIS — Z09 Encounter for follow-up examination after completed treatment for conditions other than malignant neoplasm: Secondary | ICD-10-CM

## 2012-03-06 DIAGNOSIS — J90 Pleural effusion, not elsewhere classified: Secondary | ICD-10-CM | POA: Diagnosis not present

## 2012-03-06 DIAGNOSIS — D71 Functional disorders of polymorphonuclear neutrophils: Secondary | ICD-10-CM

## 2012-03-06 DIAGNOSIS — G8918 Other acute postprocedural pain: Secondary | ICD-10-CM

## 2012-03-06 DIAGNOSIS — D381 Neoplasm of uncertain behavior of trachea, bronchus and lung: Secondary | ICD-10-CM

## 2012-03-06 DIAGNOSIS — R918 Other nonspecific abnormal finding of lung field: Secondary | ICD-10-CM | POA: Diagnosis not present

## 2012-03-06 MED ORDER — TRAMADOL HCL 50 MG PO TABS: 50.0000 mg | ORAL_TABLET | Freq: Four times a day (QID) | ORAL | Status: DC | PRN

## 2012-03-06 NOTE — Progress Notes (Signed)
[address]            [place] [address]          [phone]       ELEESHA PURKEY [facility] Medical Record #[id number] Date of Birth: 10-Mar-1927  [name], MD [name], MD, MD  Chief Complaint:   PostOp Follow Up Visit PREOPERATIVE DIAGNOSIS: Right lower lobe lung mass.  POSTOPERATIVE DIAGNOSIS: Granulomatous disease by frozen section.  PROCEDURE PERFORMED: Right video-assisted thoracoscopy, mini  thoracotomy, wedge resection of lung nodules right lower lobe.  02/09/2012  History of Present Illness:     Patient returns today in followup after wedge resection of the right lower lobe found to be granulomatous disease on February 9. 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 Taking pain medicine only at night. She is anxious to return to work. She's had no cough fever or chills.      History  Smoking status  . Former Smoker -- 1.0 packs/day for 50 years  . Types: Cigarettes  . Quit date: 12/31/1993  Smokeless tobacco  . Never Used       Allergies  Allergen Reactions  . Neosporin (Neomycin-Polymyx-Gramicid) Dermatitis  . Statins Other (See Comments)    Myalgias and unsteady gait    Current Outpatient Prescriptions  Medication Sig Dispense Refill  . acetaminophen (TYLENOL) 325 MG tablet Take 650 mg by mouth every 6 (six) hours as needed. For pain      . allopurinol (ZYLOPRIM) 100 MG tablet Take 100 mg by mouth daily.       Marland Kitchen amLODipine-olmesartan (AZOR) 10-40 MG per tablet Take 0.5 tablets by mouth daily.       Marland Kitchen aspirin 81 MG tablet Take 81 mg by mouth every Monday, Wednesday, and Friday.       . calcium-vitamin D (OSCAL WITH D) 500-200 MG-UNIT per tablet Take 1 tablet by mouth 2 (two) times daily.      . colchicine 0.6 MG tablet Take 0.6 mg by mouth 2 (two) times daily as needed. For gout      . EPINEPHrine (EPIPEN 2-PAK) 0.3 mg/0.3 mL DEVI Inject 0.3 mg into the  muscle once.      . fluticasone (FLONASE) 50 MCG/ACT nasal spray Place 2 sprays into the nose daily.      . folic acid (FOLVITE) 1 MG tablet Take 1 mg by mouth daily.      . furosemide (LASIX) 40 MG tablet Take 40 mg by mouth daily as needed. FOR LEG SWELLING      . Tetrahydrozoline HCl (VISINE OP) Place 1 drop into both eyes every 6 (six) hours. For dry eyes      . traMADol (ULTRAM) 50 MG tablet Take 50 mg by mouth every 6 (six) hours as needed. 1 or 2 q 4-6 hrs prn           Physical Exam: BP 96/54  Pulse 78  Resp 16  Ht 5\' 3"  (1.6 m)  Wt 105 lb (47.628 kg)  BMI 18.60 kg/m2  SpO2 97%  General appearance: alert and cooperative Neurologic: intact Heart: regular rate and rhythm, S1, S2 normal, no murmur, click, rub or gallop Lungs: clear to auscultation bilaterally Extremities: extremities normal, atraumatic, no cyanosis or edema, Homans sign is negative, no sign of DVT and no edema, redness or tenderness  in the calves or thighs Wound: The right chest incision is well-healed the chest tube suture was removed.   Diagnostic Studies & Laboratory data:         Recent Radiology Findings: Dg Chest 2 View  03/06/2012  *RADIOLOGY REPORT*  Clinical Data: 78 year old female with recent right lung surgery. Lung carcinoma.  CHEST - 2 VIEW  Comparison: 02/23/2012 and earlier.  Findings: Stable right pleural effusion.  Stable lung volumes. Cardiac size and mediastinal contours are within normal limits.  No pneumothorax or pulmonary edema.  The left lung remains grossly clear.  Mildly nodular right lung base opacity is stable. No acute osseous abnormality identified.  IMPRESSION: Stable right pleural effusion and nodular right basilar opacity. Left pleural effusion has resolved.  Original Report Authenticated By: Harley Hallmark, M.D.      Recent Labs: Lab Results  Component Value Date   WBC 11.2* 02/21/2012   HGB 11.0* 02/21/2012   HCT 34.2* 02/21/2012   PLT 240 02/21/2012   GLUCOSE 99  02/21/2012   ALT 13 02/21/2012   AST 19 02/21/2012   NA 139 02/21/2012   K 4.1 02/21/2012   CL 105 02/21/2012   CREATININE 1.12* 02/21/2012   BUN 15 02/21/2012   CO2 27 02/21/2012   INR 0.95 02/18/2012      Assessment / Plan:     Patient is stable following wedge resection of right lower lobe nodular densities which were granulomatous disease final path. Allow the patient to return to driving, if she's not using pain medication Limit her lifting to 15 pounds for several months She will return to see me with a followup chest x-ray in 3 months, and we will likely get a followup CT scan in 6 months.     Delight Ovens MD 03/06/2012 10:10 AM

## 2012-03-06 NOTE — Patient Instructions (Signed)
May drive No lifting over 15 lbs for 2 months

## 2012-03-21 LAB — FUNGUS CULTURE W SMEAR: Fungal Smear: NONE SEEN

## 2012-04-04 LAB — AFB CULTURE WITH SMEAR (NOT AT ARMC): Acid Fast Smear: NONE SEEN

## 2012-05-08 ENCOUNTER — Ambulatory Visit: Payer: Medicare Other | Admitting: Cardiothoracic Surgery

## 2012-05-12 DIAGNOSIS — H43819 Vitreous degeneration, unspecified eye: Secondary | ICD-10-CM | POA: Diagnosis not present

## 2012-05-12 DIAGNOSIS — H52209 Unspecified astigmatism, unspecified eye: Secondary | ICD-10-CM | POA: Diagnosis not present

## 2012-05-12 DIAGNOSIS — Z961 Presence of intraocular lens: Secondary | ICD-10-CM | POA: Diagnosis not present

## 2012-05-12 DIAGNOSIS — H04129 Dry eye syndrome of unspecified lacrimal gland: Secondary | ICD-10-CM | POA: Diagnosis not present

## 2012-05-16 ENCOUNTER — Other Ambulatory Visit: Payer: Self-pay | Admitting: Cardiothoracic Surgery

## 2012-05-16 DIAGNOSIS — D381 Neoplasm of uncertain behavior of trachea, bronchus and lung: Secondary | ICD-10-CM

## 2012-05-29 ENCOUNTER — Ambulatory Visit
Admission: RE | Admit: 2012-05-29 | Discharge: 2012-05-29 | Disposition: A | Discharge status: Home / Self Care | Payer: Medicare Other | Source: Ambulatory Visit | Attending: Cardiothoracic Surgery | Admitting: Cardiothoracic Surgery

## 2012-05-29 ENCOUNTER — Ambulatory Visit (INDEPENDENT_AMBULATORY_CARE_PROVIDER_SITE_OTHER): Payer: Medicare Other | Admitting: Cardiothoracic Surgery

## 2012-05-29 ENCOUNTER — Encounter: Payer: Self-pay | Admitting: Cardiothoracic Surgery

## 2012-05-29 VITALS — BP 85/49 | HR 100 | Resp 16 | Ht 63.75 in | Wt 108.5 lb

## 2012-05-29 DIAGNOSIS — J8410 Pulmonary fibrosis, unspecified: Secondary | ICD-10-CM

## 2012-05-29 DIAGNOSIS — J449 Chronic obstructive pulmonary disease, unspecified: Secondary | ICD-10-CM | POA: Diagnosis not present

## 2012-05-29 DIAGNOSIS — J984 Other disorders of lung: Secondary | ICD-10-CM

## 2012-05-29 DIAGNOSIS — Z09 Encounter for follow-up examination after completed treatment for conditions other than malignant neoplasm: Secondary | ICD-10-CM | POA: Diagnosis not present

## 2012-05-29 DIAGNOSIS — D381 Neoplasm of uncertain behavior of trachea, bronchus and lung: Secondary | ICD-10-CM

## 2012-05-29 NOTE — Progress Notes (Signed)
[address]            [place] [address]          [phone]        April Burns Medical Record #[id number] Date of Birth: April 10, 1927  April Dubonnet, MD April Dubonnet, MD, MD  Chief Complaint:   PostOp Follow Up Visit PREOPERATIVE DIAGNOSIS: Right lower lobe lung mass.  POSTOPERATIVE DIAGNOSIS: Granulomatous disease by frozen section.  PROCEDURE PERFORMED: Right video-assisted thoracoscopy, mini  thoracotomy, wedge resection of lung nodules right lower lobe.  02/09/2012  History of Present Illness:     Patient returns today in followup after wedge resection of the right lower lobe found to be granulomatous disease on February 9. 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 She's had no respiratory difficulty. She recently returned from a trip to [place].     History  Smoking status  . Former Smoker -- 1.0 packs/day for 50 years  . Types: Cigarettes  . Quit date: 12/31/1993  Smokeless tobacco  . Never Used       Allergies  Allergen Reactions  . Neosporin (Neomycin-Polymyxin-Gramicidin) Dermatitis  . Statins Other (See Comments)    Myalgias and unsteady gait    Current Outpatient Prescriptions  Medication Sig Dispense Refill  . acetaminophen (TYLENOL) 325 MG tablet Take 650 mg by mouth every 6 (six) hours as needed. For pain      . allopurinol (ZYLOPRIM) 100 MG tablet Take 100 mg by mouth daily.       Marland Kitchen amLODipine-olmesartan (AZOR) 10-40 MG per tablet Take 0.5 tablets by mouth daily.       Marland Kitchen aspirin 81 MG tablet Take 81 mg by mouth every Monday, Wednesday, and Friday.       . calcium-vitamin D (OSCAL WITH D) 500-200 MG-UNIT per tablet Take 1 tablet by mouth 2 (two) times daily.      . colchicine 0.6 MG tablet Take 0.6 mg by mouth 2 (two) times daily as needed. For gout      . EPINEPHrine (EPIPEN 2-PAK) 0.3 mg/0.3 mL DEVI Inject 0.3 mg  into the muscle once.      . fluticasone (FLONASE) 50 MCG/ACT nasal spray Place 2 sprays into the nose daily.      . folic acid (FOLVITE) 1 MG tablet Take 1 mg by mouth daily.      . furosemide (LASIX) 40 MG tablet Take 40 mg by mouth daily as needed. FOR LEG SWELLING      . Tetrahydrozoline HCl (VISINE OP) Place 1 drop into both eyes every 6 (six) hours. For dry eyes      . traMADol (ULTRAM) 50 MG tablet Take 1 tablet (50 mg total) by mouth every 6 (six) hours as needed. 1 or 2 q 6h prn  40 tablet  0       Physical Exam: BP 85/49  Pulse 100  Resp 16  Ht 5' 3.75" (1.619 m)  Wt 108 lb 8 oz (49.215 kg)  BMI 18.77 kg/m2  SpO2 99% Repeat blood pressure was 92/50  General appearance: alert and cooperative Neurologic: intact Heart: regular rate and rhythm, S1, S2 normal, no murmur, click, rub or gallop Lungs: clear to auscultation bilaterally Extremities: extremities  normal, atraumatic, no cyanosis or edema, Homans sign is negative, no sign of DVT and no edema, redness or tenderness in the calves or thighs Wound: The right chest incision is well-healed    Diagnostic Studies & Laboratory data:         Recent Radiology Findings: Dg Chest 2 View  05/29/2012  *RADIOLOGY REPORT*  Clinical Data: History of right lung surgery recently, follow-up  CHEST - 2 VIEW  Comparison: Chest x-ray of 03/06/2012  Findings: Aeration of the right lung base has improved.  There has been a decrease in right basilar atelectasis with resolution of the small right effusion.  The lungs remain somewhat hyperaerated. Mediastinal contours are stable and heart size is stable.  No acute bony abnormality is seen.  IMPRESSION: Improved aeration with resolution of right effusion and decrease in right basilar atelectasis.  COPD.  Original Report Authenticated By: Juline Patch, M.D.      Recent Labs: Lab Results  Component Value Date   WBC 11.2* 02/21/2012   HGB 11.0* 02/21/2012   HCT 34.2* 02/21/2012   PLT 240  02/21/2012   GLUCOSE 99 02/21/2012   ALT 13 02/21/2012   AST 19 02/21/2012   NA 139 02/21/2012   K 4.1 02/21/2012   CL 105 02/21/2012   CREATININE 1.12* 02/21/2012   BUN 15 02/21/2012   CO2 27 02/21/2012   INR 0.95 02/18/2012      Assessment / Plan:     Patient is stable following wedge resection of right lower lobe nodular densities which were granulomatous disease final path. Patient's currently on blood pressure medication and intermittently taking 40mg   of Lasix when necessary. She has an appointment to see Dr. Kevan Ny tomorrow. I but encouraged her to liberalize her fluid intake stop taking Lasix and hold her blood pressure medication tomorrow morning. She may need adjustments to the dosing of her blood pressure medication.  Her chest x-ray today has improved from early postoperative films, is no evidence of changes were new nodules. We will obtain a followup CT scan in September to evaluate accurately any changes in the lung.    Delight Ovens MD 05/29/2012 1:06 PM

## 2012-05-30 DIAGNOSIS — Z79899 Other long term (current) drug therapy: Secondary | ICD-10-CM | POA: Diagnosis not present

## 2012-05-30 DIAGNOSIS — E559 Vitamin D deficiency, unspecified: Secondary | ICD-10-CM | POA: Diagnosis not present

## 2012-05-30 DIAGNOSIS — E7800 Pure hypercholesterolemia, unspecified: Secondary | ICD-10-CM | POA: Diagnosis not present

## 2012-05-30 DIAGNOSIS — I10 Essential (primary) hypertension: Secondary | ICD-10-CM | POA: Diagnosis not present

## 2012-05-30 DIAGNOSIS — G2589 Other specified extrapyramidal and movement disorders: Secondary | ICD-10-CM | POA: Diagnosis not present

## 2012-05-30 DIAGNOSIS — J309 Allergic rhinitis, unspecified: Secondary | ICD-10-CM | POA: Diagnosis not present

## 2012-05-30 DIAGNOSIS — M109 Gout, unspecified: Secondary | ICD-10-CM | POA: Diagnosis not present

## 2012-05-30 DIAGNOSIS — R4589 Other symptoms and signs involving emotional state: Secondary | ICD-10-CM | POA: Diagnosis not present

## 2012-05-30 DIAGNOSIS — R634 Abnormal weight loss: Secondary | ICD-10-CM | POA: Diagnosis not present

## 2012-06-04 ENCOUNTER — Ambulatory Visit
Admission: RE | Admit: 2012-06-04 | Discharge: 2012-06-04 | Disposition: A | Discharge status: Home / Self Care | Payer: Medicare Other | Source: Ambulatory Visit | Attending: Internal Medicine | Admitting: Internal Medicine

## 2012-06-04 ENCOUNTER — Other Ambulatory Visit: Payer: Self-pay | Admitting: Internal Medicine

## 2012-06-04 DIAGNOSIS — M109 Gout, unspecified: Secondary | ICD-10-CM | POA: Diagnosis not present

## 2012-06-04 DIAGNOSIS — N289 Disorder of kidney and ureter, unspecified: Secondary | ICD-10-CM | POA: Diagnosis not present

## 2012-06-04 DIAGNOSIS — I10 Essential (primary) hypertension: Secondary | ICD-10-CM | POA: Diagnosis not present

## 2012-06-12 ENCOUNTER — Encounter: Payer: Medicare Other | Admitting: Cardiothoracic Surgery

## 2012-07-01 DIAGNOSIS — M109 Gout, unspecified: Secondary | ICD-10-CM | POA: Diagnosis not present

## 2012-07-01 DIAGNOSIS — N289 Disorder of kidney and ureter, unspecified: Secondary | ICD-10-CM | POA: Diagnosis not present

## 2012-08-13 ENCOUNTER — Other Ambulatory Visit: Payer: Self-pay | Admitting: Cardiothoracic Surgery

## 2012-08-13 DIAGNOSIS — R918 Other nonspecific abnormal finding of lung field: Secondary | ICD-10-CM

## 2012-08-13 DIAGNOSIS — R222 Localized swelling, mass and lump, trunk: Secondary | ICD-10-CM | POA: Diagnosis not present

## 2012-09-25 ENCOUNTER — Encounter: Payer: Self-pay | Admitting: Cardiothoracic Surgery

## 2012-09-25 ENCOUNTER — Other Ambulatory Visit: Payer: Self-pay | Admitting: Cardiothoracic Surgery

## 2012-09-25 ENCOUNTER — Ambulatory Visit (INDEPENDENT_AMBULATORY_CARE_PROVIDER_SITE_OTHER): Payer: Medicare Other | Admitting: Cardiothoracic Surgery

## 2012-09-25 ENCOUNTER — Ambulatory Visit
Admission: RE | Admit: 2012-09-25 | Discharge: 2012-09-25 | Disposition: A | Discharge status: Home / Self Care | Payer: Medicare Other | Source: Ambulatory Visit | Attending: Cardiothoracic Surgery | Admitting: Cardiothoracic Surgery

## 2012-09-25 ENCOUNTER — Ambulatory Visit: Payer: Medicare Other | Admitting: Cardiothoracic Surgery

## 2012-09-25 VITALS — BP 94/54 | HR 70 | Resp 18 | Ht 63.75 in | Wt 108.0 lb

## 2012-09-25 DIAGNOSIS — Z09 Encounter for follow-up examination after completed treatment for conditions other than malignant neoplasm: Secondary | ICD-10-CM | POA: Diagnosis not present

## 2012-09-25 DIAGNOSIS — R918 Other nonspecific abnormal finding of lung field: Secondary | ICD-10-CM

## 2012-09-25 DIAGNOSIS — R222 Localized swelling, mass and lump, trunk: Secondary | ICD-10-CM | POA: Diagnosis not present

## 2012-09-25 DIAGNOSIS — J984 Other disorders of lung: Secondary | ICD-10-CM | POA: Diagnosis not present

## 2012-09-25 DIAGNOSIS — J8410 Pulmonary fibrosis, unspecified: Secondary | ICD-10-CM

## 2012-09-25 LAB — BUN: BUN: 80 mg/dL — ABNORMAL HIGH (ref 6–23)

## 2012-09-25 LAB — CREATININE, SERUM: Creat: 1.8 mg/dL — ABNORMAL HIGH (ref 0.50–1.10)

## 2012-09-25 NOTE — Progress Notes (Addendum)
[address]            [place] [address]          [phone]       Dominika Kostas [place] Medical Record #[id number] Date of Birth: 09/24/1927  Barbette Or, MD [name], MD  Chief Complaint:   PostOp Follow Up Visit PREOPERATIVE DIAGNOSIS: Right lower lobe lung mass.  POSTOPERATIVE DIAGNOSIS: Granulomatous disease by frozen section.  PROCEDURE PERFORMED: Right video-assisted thoracoscopy, mini  thoracotomy, wedge resection of lung nodules right lower lobe.  02/09/2012  History of Present Illness:     Patient returns today in followup after wedge resection of the right lower lobe found to be granulomatous disease on February 9. 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 She's had no respiratory difficulty. Feels well. No cough     History  Smoking status  . Former Smoker -- 1.0 packs/day for 50 years  . Types: Cigarettes  . Quit date: 12/31/1993  Smokeless tobacco  . Never Used       Allergies  Allergen Reactions  . Neosporin (Neomycin-Polymyxin-Gramicidin) Dermatitis  . Statins Other (See Comments)    Myalgias and unsteady gait    Current Outpatient Prescriptions  Medication Sig Dispense Refill  . acetaminophen (TYLENOL) 325 MG tablet Take 650 mg by mouth every 6 (six) hours as needed. For pain      . allopurinol (ZYLOPRIM) 100 MG tablet Take 100 mg by mouth daily.       Marland Kitchen amLODipine-olmesartan (AZOR) 10-40 MG per tablet Take 0.5 tablets by mouth daily.       Marland Kitchen aspirin 81 MG tablet Take 81 mg by mouth every Monday, Wednesday, and Friday.       . calcium-vitamin D (OSCAL WITH D) 500-200 MG-UNIT per tablet Take 1 tablet by mouth 2 (two) times daily.      . colchicine 0.6 MG tablet Take 0.6 mg by mouth 2 (two) times daily as needed. For gout      . EPINEPHrine (EPIPEN 2-PAK) 0.3 mg/0.3 mL DEVI Inject 0.3 mg into the muscle once.      . fluticasone  (FLONASE) 50 MCG/ACT nasal spray Place 2 sprays into the nose daily.      . folic acid (FOLVITE) 1 MG tablet Take 1 mg by mouth daily.      . furosemide (LASIX) 40 MG tablet Take 40 mg by mouth daily as needed. FOR LEG SWELLING      . Tetrahydrozoline HCl (VISINE OP) Place 1 drop into both eyes every 6 (six) hours. For dry eyes      . traMADol (ULTRAM) 50 MG tablet Take 1 tablet (50 mg total) by mouth every 6 (six) hours as needed. 1 or 2 q 6h prn  40 tablet  0       Physical Exam: BP 94/54  Pulse 70  Resp 18  Ht 5' 3.75" (1.619 m)  Wt 108 lb (48.988 kg)  BMI 18.68 kg/m2  SpO2 97% Repeat blood pressure was 92/50  General appearance: alert and cooperative Neurologic: intact Heart: regular rate and rhythm, S1, S2 normal, no murmur, click, rub or gallop Lungs: clear to auscultation bilaterally Extremities: extremities normal, atraumatic, no cyanosis or edema, Homans sign  is negative, no sign of DVT and no edema, redness or tenderness in the calves or thighs Wound: The right chest incision is well-healed    Diagnostic Studies & Laboratory data:         Recent Radiology Findings: Ct Chest Wo Contrast  09/25/2012  *RADIOLOGY REPORT*  Clinical Data: Pulmonary nodules. Previously resected granuloma in the right lower lobe.  CT CHEST WITHOUT CONTRAST  Technique:  Multidetector CT imaging of the chest was performed following the standard protocol without IV contrast.  Comparison: CT scan dated 01/10/2012  Findings: The small ill-defined densities in the right upper and middle lobes on the prior exam have completely resolved.  There are two tiny nodules, 1-2 mm in the right upper lobe which are unchanged.  These are visible on image number 15 of series 4.  There has been a wedge resection of the nodule at the right lung base.  There has been significant clearing of the right lung base since the prior exam.  No acute osseous abnormalities.  No new abnormalities.  Minimal scarring at the left lung  base posteriorly.  Heart size is normal. Vascularity is normal.  No effusions.  IMPRESSION: Complete clearing of the ill-defined densities in the right upper and middle lobes.  Minimal residual scarring at the right lung base at the site of prior surgery.   Original Report Authenticated By: Gwynn Burly, M.D.       Recent Labs: Lab Results  Component Value Date   WBC 11.2* 02/21/2012   HGB 11.0* 02/21/2012   HCT 34.2* 02/21/2012   PLT 240 02/21/2012   GLUCOSE 99 02/21/2012   ALT 13 02/21/2012   AST 19 02/21/2012   NA 139 02/21/2012   K 4.1 02/21/2012   CL 105 02/21/2012   CREATININE 1.80* 08/13/2012   BUN 80* 08/13/2012   CO2 27 02/21/2012   INR 0.95 02/18/2012      Assessment / Plan:   Patient is stable following wedge resection of right lower lobe nodular densities which were granulomatous disease final path.  Her chest ct has  improved from early postoperative films, is no evidence of changes were new nodules. We will obtain a followup CT scan in one year to evaluate accurately any changes in the lung. Renal Insufficiency cr 1.8/ Bun80   Delight Ovens MD 09/25/2012 3:56 PM

## 2012-09-25 NOTE — Patient Instructions (Signed)
Ct scan looks good Return one year for follow ct

## 2012-09-26 DIAGNOSIS — E559 Vitamin D deficiency, unspecified: Secondary | ICD-10-CM | POA: Diagnosis not present

## 2012-09-26 DIAGNOSIS — Z87891 Personal history of nicotine dependence: Secondary | ICD-10-CM | POA: Diagnosis not present

## 2012-09-26 DIAGNOSIS — N289 Disorder of kidney and ureter, unspecified: Secondary | ICD-10-CM | POA: Diagnosis not present

## 2012-09-26 DIAGNOSIS — Z23 Encounter for immunization: Secondary | ICD-10-CM | POA: Diagnosis not present

## 2012-09-26 DIAGNOSIS — I10 Essential (primary) hypertension: Secondary | ICD-10-CM | POA: Diagnosis not present

## 2012-09-26 DIAGNOSIS — R4589 Other symptoms and signs involving emotional state: Secondary | ICD-10-CM | POA: Diagnosis not present

## 2012-09-26 DIAGNOSIS — G2589 Other specified extrapyramidal and movement disorders: Secondary | ICD-10-CM | POA: Diagnosis not present

## 2012-09-26 DIAGNOSIS — Z0000 Encounter for general adult medical examination without abnormal findings: Secondary | ICD-10-CM | POA: Diagnosis not present

## 2012-09-26 DIAGNOSIS — M109 Gout, unspecified: Secondary | ICD-10-CM | POA: Diagnosis not present

## 2012-09-26 DIAGNOSIS — Z79899 Other long term (current) drug therapy: Secondary | ICD-10-CM | POA: Diagnosis not present

## 2012-09-26 DIAGNOSIS — R634 Abnormal weight loss: Secondary | ICD-10-CM | POA: Diagnosis not present

## 2012-10-02 ENCOUNTER — Other Ambulatory Visit: Payer: Self-pay | Admitting: Nephrology

## 2012-10-06 ENCOUNTER — Other Ambulatory Visit: Payer: Medicare Other

## 2012-10-08 ENCOUNTER — Ambulatory Visit
Admission: RE | Admit: 2012-10-08 | Discharge: 2012-10-08 | Disposition: A | Discharge status: Home / Self Care | Payer: Medicare Other | Source: Ambulatory Visit | Attending: Nephrology | Admitting: Nephrology

## 2012-11-05 DIAGNOSIS — H903 Sensorineural hearing loss, bilateral: Secondary | ICD-10-CM | POA: Diagnosis not present

## 2013-01-21 DIAGNOSIS — Z1331 Encounter for screening for depression: Secondary | ICD-10-CM | POA: Diagnosis not present

## 2013-01-21 DIAGNOSIS — I10 Essential (primary) hypertension: Secondary | ICD-10-CM | POA: Diagnosis not present

## 2013-01-21 DIAGNOSIS — R609 Edema, unspecified: Secondary | ICD-10-CM | POA: Diagnosis not present

## 2013-01-21 DIAGNOSIS — J329 Chronic sinusitis, unspecified: Secondary | ICD-10-CM | POA: Diagnosis not present

## 2013-01-21 DIAGNOSIS — M779 Enthesopathy, unspecified: Secondary | ICD-10-CM | POA: Diagnosis not present

## 2013-02-04 DIAGNOSIS — R609 Edema, unspecified: Secondary | ICD-10-CM | POA: Diagnosis not present

## 2013-02-04 DIAGNOSIS — M779 Enthesopathy, unspecified: Secondary | ICD-10-CM | POA: Diagnosis not present

## 2013-02-04 DIAGNOSIS — Z79899 Other long term (current) drug therapy: Secondary | ICD-10-CM | POA: Diagnosis not present

## 2013-02-04 DIAGNOSIS — Z0000 Encounter for general adult medical examination without abnormal findings: Secondary | ICD-10-CM | POA: Diagnosis not present

## 2013-02-04 DIAGNOSIS — N289 Disorder of kidney and ureter, unspecified: Secondary | ICD-10-CM | POA: Diagnosis not present

## 2013-02-04 DIAGNOSIS — M109 Gout, unspecified: Secondary | ICD-10-CM | POA: Diagnosis not present

## 2013-02-04 DIAGNOSIS — I10 Essential (primary) hypertension: Secondary | ICD-10-CM | POA: Diagnosis not present

## 2013-02-04 DIAGNOSIS — J329 Chronic sinusitis, unspecified: Secondary | ICD-10-CM | POA: Diagnosis not present

## 2013-02-04 DIAGNOSIS — G2589 Other specified extrapyramidal and movement disorders: Secondary | ICD-10-CM | POA: Diagnosis not present

## 2013-02-20 DIAGNOSIS — J019 Acute sinusitis, unspecified: Secondary | ICD-10-CM | POA: Diagnosis not present

## 2013-02-20 DIAGNOSIS — I10 Essential (primary) hypertension: Secondary | ICD-10-CM | POA: Diagnosis not present

## 2013-05-06 DIAGNOSIS — R634 Abnormal weight loss: Secondary | ICD-10-CM | POA: Diagnosis not present

## 2013-05-06 DIAGNOSIS — J209 Acute bronchitis, unspecified: Secondary | ICD-10-CM | POA: Diagnosis not present

## 2013-05-06 DIAGNOSIS — I10 Essential (primary) hypertension: Secondary | ICD-10-CM | POA: Diagnosis not present

## 2013-05-06 DIAGNOSIS — R4589 Other symptoms and signs involving emotional state: Secondary | ICD-10-CM | POA: Diagnosis not present

## 2013-05-06 DIAGNOSIS — M109 Gout, unspecified: Secondary | ICD-10-CM | POA: Diagnosis not present

## 2013-05-06 DIAGNOSIS — N289 Disorder of kidney and ureter, unspecified: Secondary | ICD-10-CM | POA: Diagnosis not present

## 2013-05-06 DIAGNOSIS — G2589 Other specified extrapyramidal and movement disorders: Secondary | ICD-10-CM | POA: Diagnosis not present

## 2013-05-06 DIAGNOSIS — Z79899 Other long term (current) drug therapy: Secondary | ICD-10-CM | POA: Diagnosis not present

## 2013-05-14 DIAGNOSIS — Z961 Presence of intraocular lens: Secondary | ICD-10-CM | POA: Diagnosis not present

## 2013-05-14 DIAGNOSIS — H2640 Unspecified secondary cataract: Secondary | ICD-10-CM | POA: Diagnosis not present

## 2013-05-14 DIAGNOSIS — H43819 Vitreous degeneration, unspecified eye: Secondary | ICD-10-CM | POA: Diagnosis not present

## 2013-05-14 DIAGNOSIS — H04129 Dry eye syndrome of unspecified lacrimal gland: Secondary | ICD-10-CM | POA: Diagnosis not present

## 2013-08-27 ENCOUNTER — Other Ambulatory Visit: Payer: Self-pay | Admitting: *Deleted

## 2013-08-27 DIAGNOSIS — R911 Solitary pulmonary nodule: Secondary | ICD-10-CM

## 2013-09-24 IMAGING — CT CT ABD-PELV W/O CM
3 of 4 series · 12 of 36 positions shown, 18 images · IV contrast (READICAT/WATER)
Comparison: None - - - no IV contrast media was given due to
abnormal renal laboratory values and GFR of 27.

CLINICAL DATA: Unexplained weight loss, change in bowel habits

CT ABDOMEN AND PELVIS WITHOUT CONTRAST
TECHNIQUE: Multidetector CT imaging of the abdomen and pelvis was
performed following the standard protocol without intravenous
contrast.

[Series 3: abd/pelvis w/o · axial · non-contrast · 0.70mm/px · z∈[-324,-34]mm · 8 of 76 slices shown, 13 images]
[im 9/76  soft-tissue]
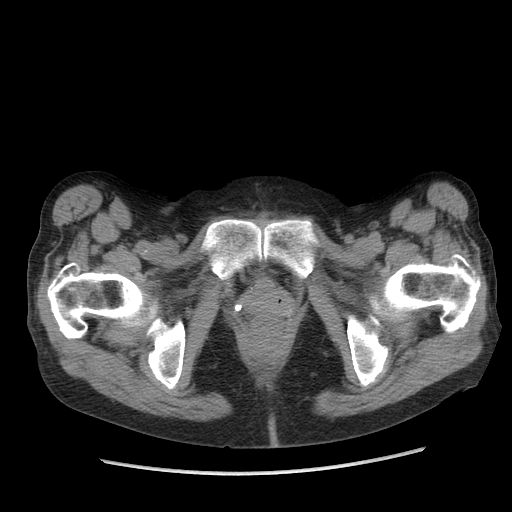
[im 9/76  bone]
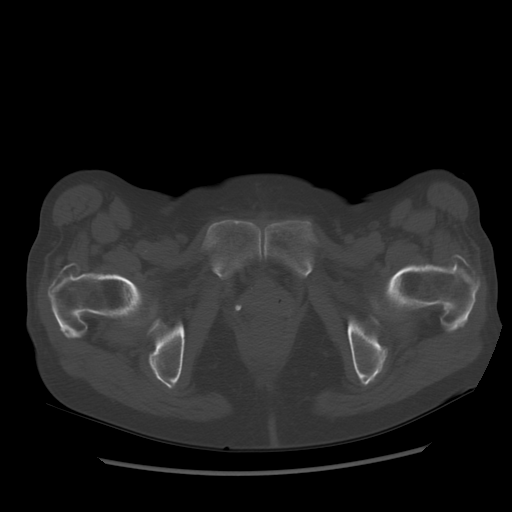
[im 17/76  soft-tissue]
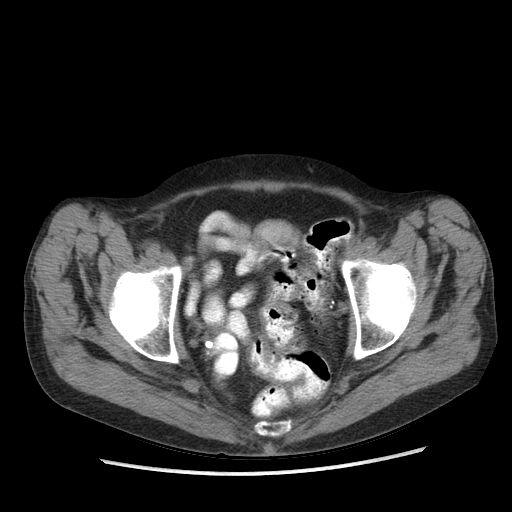
[im 26/76  soft-tissue]
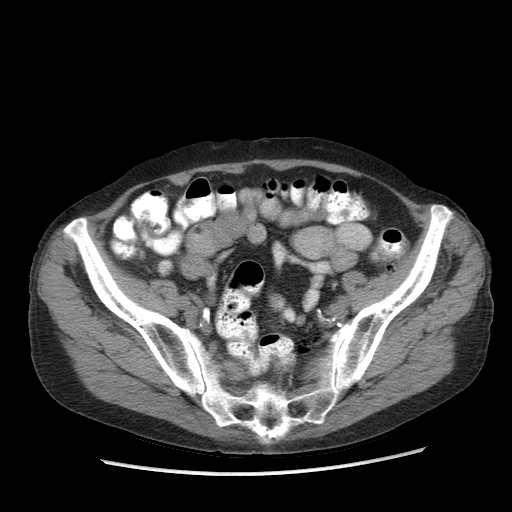
[im 34/76  soft-tissue]
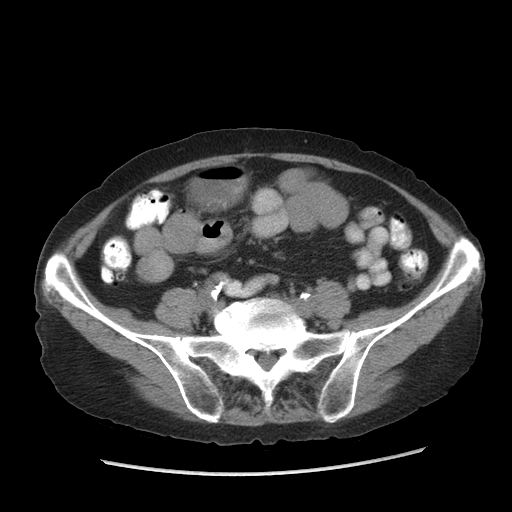
[im 42/76  soft-tissue]
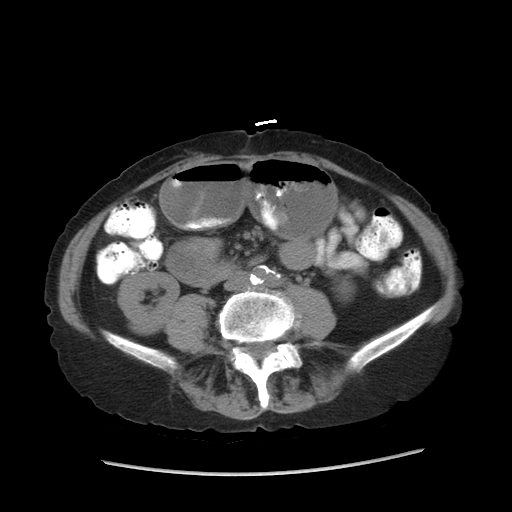
[im 42/76  lung]
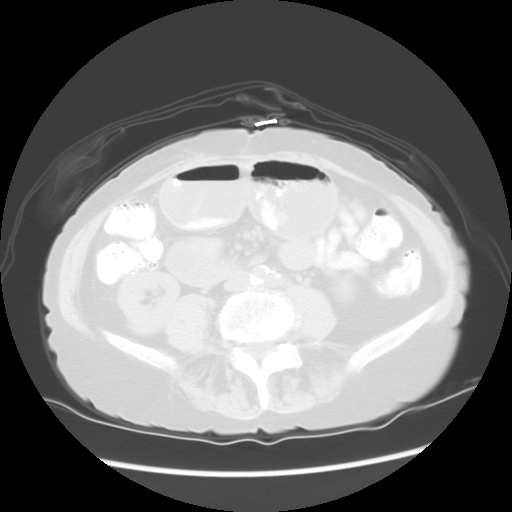
[im 51/76  soft-tissue]
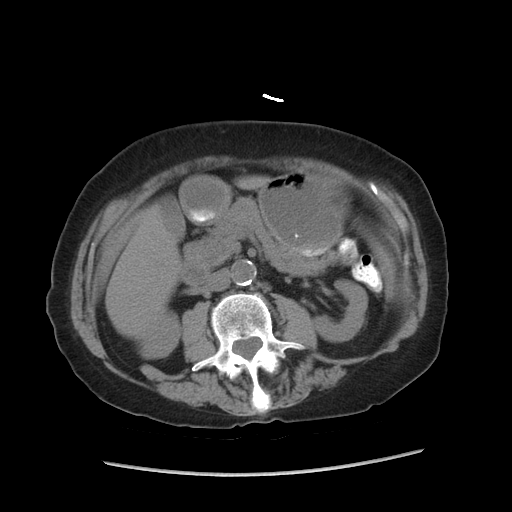
[im 51/76  lung]
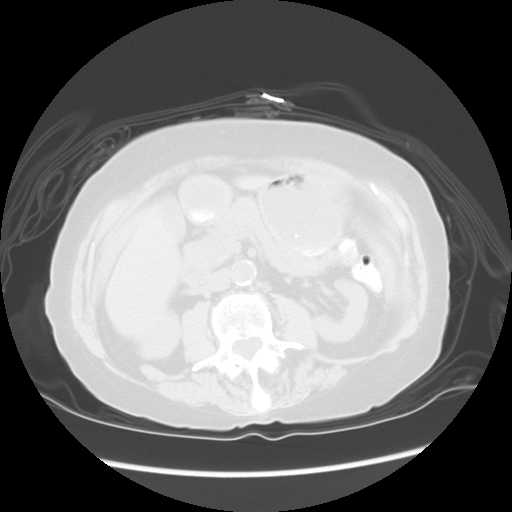
[im 59/76  soft-tissue]
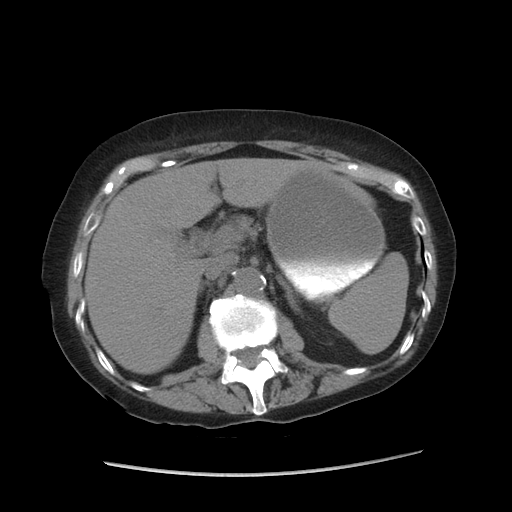
[im 59/76  lung]
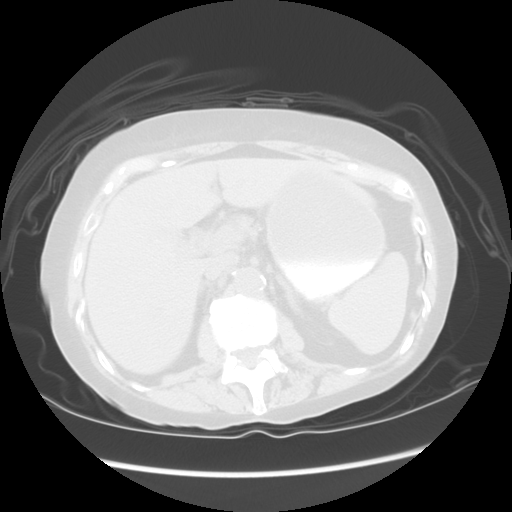
[im 67/76  soft-tissue]
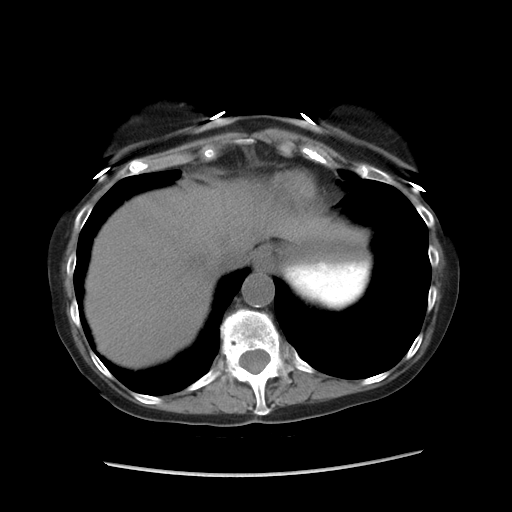
[im 67/76  lung]
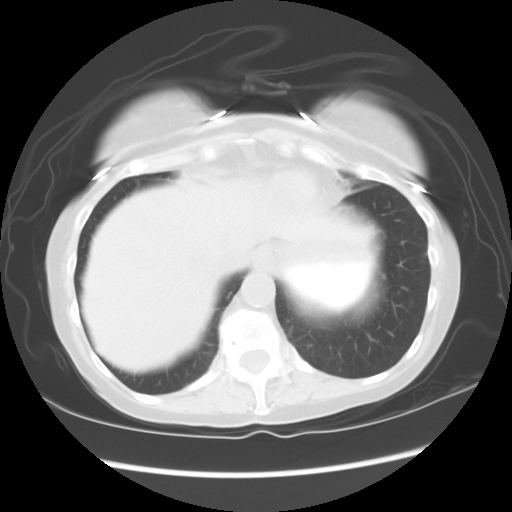

[Series 601: coronal body · coronal · 0.75mm/px · 1 of 115 slices shown, 2 images]
[im 39/115  soft-tissue]
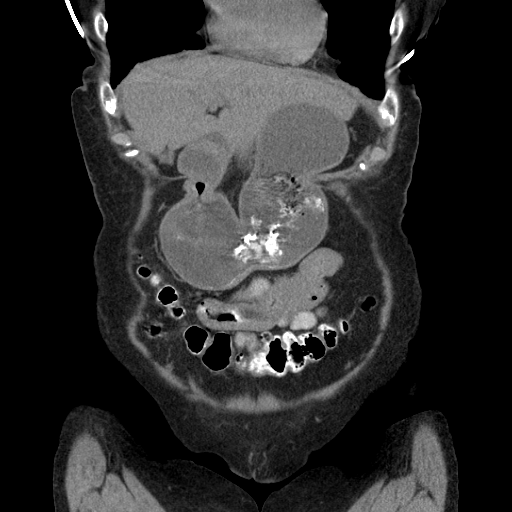
[im 39/115  bone]
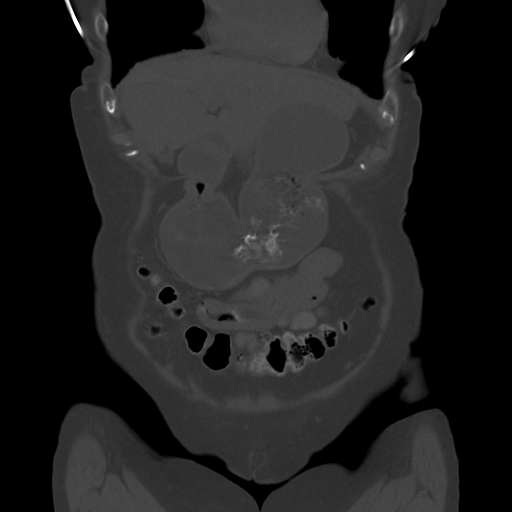

[Series 602: sagittal body · sagittal · 0.75mm/px · 3 of 140 slices shown]
[im 24/140  soft-tissue]
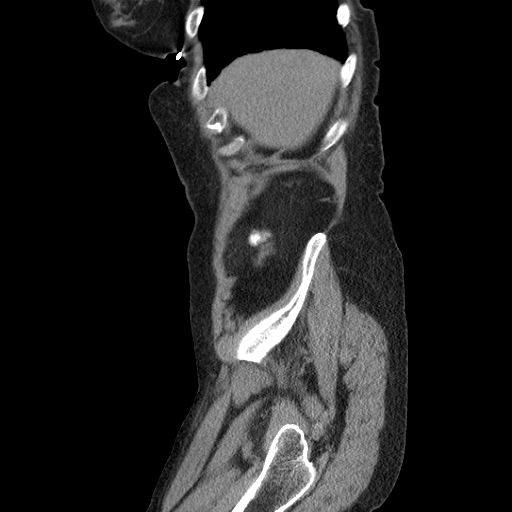
[im 39/140  soft-tissue]
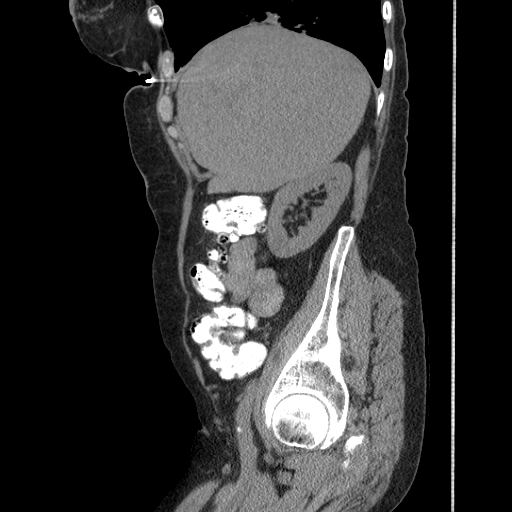
[im 55/140  soft-tissue]
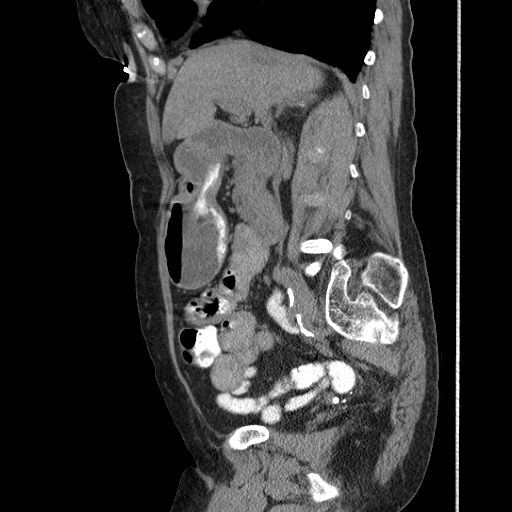

[12 of 36 positions shown; findings below may reference images not displayed]

FINDINGS: There is an abnormal nodular opacity abutting the right
hemidiaphragm at the right lung base within the right lower lobes
suspicious for lung neoplasm.  On sagittal and coronal images this
area measures 2.5 cm in width, 2.6 cm in depth, and 1.1 cm in
height.  A 9 mm nodular lesion also is noted more posteriorly
within the right lower lobe suspicious for metastasis.  CT of the
chest with IV contrast may be warranted to evaluate for mediastinal
or hilar adenopathy as well as additional liver lesions.

The liver is unremarkable in the unenhanced state.  No ductal
dilatation is seen.  The gallbladder is contracted with no
calcified gallstones.  There is some higher attenuation material
layering within the gallbladder representing either gallbladder
sludge or noncalcified gallstones.  The pancreas is normal in size
and the pancreatic duct is not dilated.  The adrenal glands and
spleen are unremarkable.  The stomach is moderately distended with
fluid with no abnormality noted.  The kidneys are unremarkable in
the unenhanced state.  No renal calculi or mass is seen and no
hydronephrosis is noted.  The abdominal aorta is normal in caliber
with atheromatous change present.

The urinary bladder is decompressed and cannot be evaluated.  The
uterus has been resected previously.  No adnexal lesion is seen.
No fluid is noted within the pelvis.  There are multiple
rectosigmoid colonic diverticula present.  Diverticula also are
scattered throughout the remainder of the colon.  The terminal
ileum is unremarkable.  Degenerative changes are present throughout
the lumbar spine.
IMPRESSION: 1.  Abnormal nodular lesion at the right lung base suspicious for
primary lung carcinoma.  Recommend CT of the chest with IV contrast
to assess further.  Probable adjacent metastatic lesion in the
right lower lobe as well.
2.  No abdominal or pelvic mass or adenopathy.
3.  Diffuse colonic diverticula.

## 2013-10-01 ENCOUNTER — Ambulatory Visit
Admission: RE | Admit: 2013-10-01 | Discharge: 2013-10-01 | Disposition: A | Discharge status: Home / Self Care | Payer: Medicare Other | Source: Ambulatory Visit | Attending: Cardiothoracic Surgery | Admitting: Cardiothoracic Surgery

## 2013-10-01 ENCOUNTER — Encounter: Payer: Self-pay | Admitting: Cardiothoracic Surgery

## 2013-10-01 ENCOUNTER — Ambulatory Visit (INDEPENDENT_AMBULATORY_CARE_PROVIDER_SITE_OTHER): Payer: Medicare Other | Admitting: Cardiothoracic Surgery

## 2013-10-01 VITALS — BP 119/53 | HR 60 | Resp 18 | Ht 63.0 in | Wt 114.0 lb

## 2013-10-01 DIAGNOSIS — J984 Other disorders of lung: Secondary | ICD-10-CM | POA: Diagnosis not present

## 2013-10-01 DIAGNOSIS — R911 Solitary pulmonary nodule: Secondary | ICD-10-CM

## 2013-10-01 DIAGNOSIS — J8410 Pulmonary fibrosis, unspecified: Secondary | ICD-10-CM

## 2013-10-02 NOTE — Progress Notes (Signed)
[address]       [place] [address]             [phone]           Elah Avellino [place] [facility] Medical Record #[id number] Date of Birth: August 24, 1927  Marden Noble, MD [name], MD  Chief Complaint:   PostOp Follow Up Visit PREOPERATIVE DIAGNOSIS: Right lower lobe lung mass.  POSTOPERATIVE DIAGNOSIS: Granulomatous disease by frozen section.  PROCEDURE PERFORMED: Right video-assisted thoracoscopy, mini  thoracotomy, wedge resection of lung nodules right lower lobe.  02/09/2012  History of Present Illness:     Patient returns today in followup after wedge resection of the right lower lobe found to be granulomatous disease on February 09 2012 .  She's had no respiratory difficulty. Feels well. No cough .    History  Smoking status  . Former Smoker -- 1.00 packs/day for 50 years  . Types: Cigarettes  . Quit date: 12/31/1993  Smokeless tobacco  . Never Used       Allergies  Allergen Reactions  . Neosporin [Neomycin-Polymyxin-Gramicidin] Dermatitis  . Statins Other (See Comments)    Myalgias and unsteady gait    Current Outpatient Prescriptions  Medication Sig Dispense Refill  . acetaminophen (TYLENOL) 325 MG tablet Take 650 mg by mouth every 6 (six) hours as needed. For pain      . amLODipine-olmesartan (AZOR) 10-40 MG per tablet Take 0.5 tablets by mouth daily.       Marland Kitchen aspirin 81 MG tablet Take 81 mg by mouth every Monday, Wednesday, and Friday.       . calcium-vitamin D (OSCAL WITH D) 500-200 MG-UNIT per tablet Take 1 tablet by mouth 2 (two) times daily.      . colchicine 0.6 MG tablet Take 0.6 mg by mouth 2 (two) times daily as needed. For gout      . EPINEPHrine (EPIPEN 2-PAK) 0.3 mg/0.3 mL DEVI Inject 0.3 mg into the muscle once.      . febuxostat (ULORIC) 40 MG tablet Take 80 mg by mouth daily.      . fluticasone (FLONASE) 50 MCG/ACT nasal spray Place 2 sprays into the nose daily.      .  furosemide (LASIX) 40 MG tablet Take 40 mg by mouth daily as needed. FOR LEG SWELLING      . Tetrahydrozoline HCl (VISINE OP) Place 1 drop into both eyes every 6 (six) hours. For dry eyes       No current facility-administered medications for this visit.       Physical Exam: BP 119/53  Pulse 60  Resp 18  Ht 5\' 3"  (1.6 m)  Wt 114 lb (51.71 kg)  BMI 20.2 kg/m2  SpO2 98% Repeat blood pressure was 92/50  General appearance: alert and cooperative Neurologic: intact Heart: regular rate and rhythm, S1, S2 normal, no murmur, click, rub or gallop Lungs: clear to auscultation bilaterally Extremities: extremities normal, atraumatic, no cyanosis or edema, Homans sign is negative, no sign of DVT and no edema, redness or tenderness in the calves or thighs Wound: The right chest incision is well-healed  No cervical or axillary adenopathy  Diagnostic Studies & Laboratory data:         Recent Radiology Findings: Ct Chest Wo Contrast  10/01/2013   *  RADIOLOGY REPORT*  Clinical Data: 1 year follow up pulmonary nodules  CT CHEST WITHOUT CONTRAST  Technique:  Multidetector CT imaging of the chest was performed following the standard protocol without IV contrast.  Comparison: Prior chest CT 09/25/2012  Findings:  Mediastinum: Unremarkable CT appearance of the thyroid gland.  No suspicious mediastinal or hilar adenopathy.  No soft tissue mediastinal mass.  The thoracic esophagus is unremarkable.  Heart/Vascular: Limited evaluation in the absence of intravenous contrast.  Heart is within normal limits for size.  No pericardial effusion.  Conventional three-vessel arch anatomy.  Atherosclerotic calcifications noted throughout the ascending and transverse aorta. No aneurysmal dilatation.  Lungs/Pleura: No pericardial effusion.  Tiny 1 - 2 mm nodules in the right upper lobe not completely unchanged over the past year (image 14, series 4).  A third probable 2 mm pulmonary nodule also in the right upper lobe (image  22 series 4) is also unchanged.  No new or suspicious pulmonary nodule identified.  Stable parenchymal scarring with mild dystrophic calcification in the inferior aspect of the right lower lobe in a region of prior wedge resection.  The lungs are hyperexpanded and there is mild bronchial wall thickening.  The lungs are otherwise clear.  Upper Abdomen: Limited evaluation of the upper abdomen demonstrates no acute abnormality.  Bones: No acute fracture or aggressive appearing lytic or blastic osseous lesion.  IMPRESSION:  1.  Stable tiny right upper lobe pulmonary nodules.  No further imaging followup is required for these lesions.  2.  Stable parenchymal scarring with mild dystrophic calcifications in the region of the prior right lower lobe wedge resection.   Original Report Authenticated By: Malachy Moan, M.D.      Recent Labs: Lab Results  Component Value Date   WBC 11.2* 02/21/2012   HGB 11.0* 02/21/2012   HCT 34.2* 02/21/2012   PLT 240 02/21/2012   GLUCOSE 99 02/21/2012   ALT 13 02/21/2012   AST 19 02/21/2012   NA 139 02/21/2012   K 4.1 02/21/2012   CL 105 02/21/2012   CREATININE 1.80* 08/13/2012   BUN 80* 08/13/2012   CO2 27 02/21/2012   INR 0.95 02/18/2012      Assessment / Plan:   Patient is stable following wedge resection of right lower lobe nodular densities which were granulomatous disease final path.  Her chest ct has  improved from early postoperative films, is no evidence of changes were new nodules. We will obtain a followup CT scan in one year to evaluate accurately any changes in the lung.    Delight Ovens MD 10/02/2013 2:44 PM

## 2013-10-14 DIAGNOSIS — N289 Disorder of kidney and ureter, unspecified: Secondary | ICD-10-CM | POA: Diagnosis not present

## 2013-10-14 DIAGNOSIS — Z79899 Other long term (current) drug therapy: Secondary | ICD-10-CM | POA: Diagnosis not present

## 2013-10-14 DIAGNOSIS — G2589 Other specified extrapyramidal and movement disorders: Secondary | ICD-10-CM | POA: Diagnosis not present

## 2013-10-14 DIAGNOSIS — I10 Essential (primary) hypertension: Secondary | ICD-10-CM | POA: Diagnosis not present

## 2013-10-14 DIAGNOSIS — R4589 Other symptoms and signs involving emotional state: Secondary | ICD-10-CM | POA: Diagnosis not present

## 2013-10-14 DIAGNOSIS — Z23 Encounter for immunization: Secondary | ICD-10-CM | POA: Diagnosis not present

## 2013-10-14 DIAGNOSIS — M109 Gout, unspecified: Secondary | ICD-10-CM | POA: Diagnosis not present

## 2013-10-14 DIAGNOSIS — R634 Abnormal weight loss: Secondary | ICD-10-CM | POA: Diagnosis not present

## 2013-12-18 DIAGNOSIS — J441 Chronic obstructive pulmonary disease with (acute) exacerbation: Secondary | ICD-10-CM | POA: Diagnosis not present

## 2014-01-08 DIAGNOSIS — J441 Chronic obstructive pulmonary disease with (acute) exacerbation: Secondary | ICD-10-CM | POA: Diagnosis not present

## 2014-01-08 DIAGNOSIS — I10 Essential (primary) hypertension: Secondary | ICD-10-CM | POA: Diagnosis not present

## 2014-01-08 DIAGNOSIS — J019 Acute sinusitis, unspecified: Secondary | ICD-10-CM | POA: Diagnosis not present

## 2014-02-08 DIAGNOSIS — N289 Disorder of kidney and ureter, unspecified: Secondary | ICD-10-CM | POA: Diagnosis not present

## 2014-02-08 DIAGNOSIS — M109 Gout, unspecified: Secondary | ICD-10-CM | POA: Diagnosis not present

## 2014-02-08 DIAGNOSIS — J019 Acute sinusitis, unspecified: Secondary | ICD-10-CM | POA: Diagnosis not present

## 2014-02-08 DIAGNOSIS — G2589 Other specified extrapyramidal and movement disorders: Secondary | ICD-10-CM | POA: Diagnosis not present

## 2014-02-08 DIAGNOSIS — Z1331 Encounter for screening for depression: Secondary | ICD-10-CM | POA: Diagnosis not present

## 2014-02-08 DIAGNOSIS — I10 Essential (primary) hypertension: Secondary | ICD-10-CM | POA: Diagnosis not present

## 2014-02-08 DIAGNOSIS — J441 Chronic obstructive pulmonary disease with (acute) exacerbation: Secondary | ICD-10-CM | POA: Diagnosis not present

## 2014-02-08 DIAGNOSIS — Z0000 Encounter for general adult medical examination without abnormal findings: Secondary | ICD-10-CM | POA: Diagnosis not present

## 2014-02-24 DIAGNOSIS — M109 Gout, unspecified: Secondary | ICD-10-CM | POA: Diagnosis not present

## 2014-02-24 DIAGNOSIS — E860 Dehydration: Secondary | ICD-10-CM | POA: Diagnosis not present

## 2014-05-17 DIAGNOSIS — H52209 Unspecified astigmatism, unspecified eye: Secondary | ICD-10-CM | POA: Diagnosis not present

## 2014-05-17 DIAGNOSIS — H2640 Unspecified secondary cataract: Secondary | ICD-10-CM | POA: Diagnosis not present

## 2014-05-17 DIAGNOSIS — Z961 Presence of intraocular lens: Secondary | ICD-10-CM | POA: Diagnosis not present

## 2014-05-17 DIAGNOSIS — H43819 Vitreous degeneration, unspecified eye: Secondary | ICD-10-CM | POA: Diagnosis not present

## 2014-05-25 DIAGNOSIS — H2640 Unspecified secondary cataract: Secondary | ICD-10-CM | POA: Diagnosis not present

## 2014-05-25 DIAGNOSIS — H26499 Other secondary cataract, unspecified eye: Secondary | ICD-10-CM | POA: Diagnosis not present

## 2014-05-26 DIAGNOSIS — M109 Gout, unspecified: Secondary | ICD-10-CM | POA: Diagnosis not present

## 2014-05-26 DIAGNOSIS — R634 Abnormal weight loss: Secondary | ICD-10-CM | POA: Diagnosis not present

## 2014-05-26 DIAGNOSIS — G2589 Other specified extrapyramidal and movement disorders: Secondary | ICD-10-CM | POA: Diagnosis not present

## 2014-05-26 DIAGNOSIS — I10 Essential (primary) hypertension: Secondary | ICD-10-CM | POA: Diagnosis not present

## 2014-05-26 DIAGNOSIS — Z79899 Other long term (current) drug therapy: Secondary | ICD-10-CM | POA: Diagnosis not present

## 2014-05-26 DIAGNOSIS — E559 Vitamin D deficiency, unspecified: Secondary | ICD-10-CM | POA: Diagnosis not present

## 2014-05-26 DIAGNOSIS — R4589 Other symptoms and signs involving emotional state: Secondary | ICD-10-CM | POA: Diagnosis not present

## 2014-05-26 DIAGNOSIS — N289 Disorder of kidney and ureter, unspecified: Secondary | ICD-10-CM | POA: Diagnosis not present

## 2014-09-14 ENCOUNTER — Other Ambulatory Visit: Payer: Self-pay | Admitting: *Deleted

## 2014-09-14 DIAGNOSIS — R911 Solitary pulmonary nodule: Secondary | ICD-10-CM

## 2014-10-21 ENCOUNTER — Ambulatory Visit (INDEPENDENT_AMBULATORY_CARE_PROVIDER_SITE_OTHER): Payer: Medicare Other | Admitting: Cardiothoracic Surgery

## 2014-10-21 ENCOUNTER — Encounter (INDEPENDENT_AMBULATORY_CARE_PROVIDER_SITE_OTHER): Payer: Self-pay

## 2014-10-21 ENCOUNTER — Ambulatory Visit
Admission: RE | Admit: 2014-10-21 | Discharge: 2014-10-21 | Disposition: A | Discharge status: Home / Self Care | Payer: Medicare Other | Source: Ambulatory Visit | Attending: Cardiothoracic Surgery | Admitting: Cardiothoracic Surgery

## 2014-10-21 ENCOUNTER — Encounter: Payer: Self-pay | Admitting: Cardiothoracic Surgery

## 2014-10-21 VITALS — BP 107/61 | HR 60 | Ht 63.0 in | Wt 114.0 lb

## 2014-10-21 DIAGNOSIS — J8410 Pulmonary fibrosis, unspecified: Secondary | ICD-10-CM

## 2014-10-21 DIAGNOSIS — J984 Other disorders of lung: Secondary | ICD-10-CM

## 2014-10-21 DIAGNOSIS — R911 Solitary pulmonary nodule: Secondary | ICD-10-CM

## 2014-10-21 NOTE — Progress Notes (Signed)
[address]       [place] [address]             [phone]           April Burns [facility] Medical Record #[id number] Date of Birth: 1927/07/14  April Huddle, MD Henrine Screws, MD  Chief Complaint:   PostOp Follow Up Visit PREOPERATIVE DIAGNOSIS: Right lower lobe lung mass.  POSTOPERATIVE DIAGNOSIS: Granulomatous disease by frozen section.  PROCEDURE PERFORMED: Right video-assisted thoracoscopy, mini  thoracotomy, wedge resection of lung nodules right lower lobe.  02/09/2012  History of Present Illness:     Patient returns today in followup after wedge resection of the right lower lobe found to be granulomatous disease on February 09 2012 .  She's had no respiratory difficulty. Feels well. No cough . The patient has just returned from [place] the [place]. She remains active.  History  Smoking status  . Former Smoker -- 1.00 packs/day for 50 years  . Types: Cigarettes  . Quit date: 12/31/1993  Smokeless tobacco  . Never Used       Allergies  Allergen Reactions  . Neosporin [Neomycin-Polymyxin-Gramicidin] Dermatitis  . Statins Other (See Comments)    Myalgias and unsteady gait    Current Outpatient Prescriptions  Medication Sig Dispense Refill  . amLODipine-olmesartan (AZOR) 10-40 MG per tablet Take 0.5 tablets by mouth daily.       Marland Kitchen aspirin 81 MG tablet Take 81 mg by mouth every Monday, Wednesday, and Friday.       . calcium-vitamin D (OSCAL WITH D) 500-200 MG-UNIT per tablet Take 1 tablet by mouth 2 (two) times daily.      . colchicine 0.6 MG tablet Take 0.6 mg by mouth 2 (two) times daily as needed. For gout      . febuxostat (ULORIC) 40 MG tablet Take 80 mg by mouth daily.      . fluticasone (FLONASE) 50 MCG/ACT nasal spray Place 2 sprays into the nose daily.      . furosemide (LASIX) 40 MG tablet Take 40 mg by mouth daily as needed. FOR LEG SWELLING      . Tetrahydrozoline HCl  (VISINE OP) Place 1 drop into both eyes every 6 (six) hours. For dry eyes      . acetaminophen (TYLENOL) 325 MG tablet Take 650 mg by mouth every 6 (six) hours as needed. For pain      . EPINEPHrine (EPIPEN 2-PAK) 0.3 mg/0.3 mL DEVI Inject 0.3 mg into the muscle once.       No current facility-administered medications for this visit.       Physical Exam: BP 107/61  Pulse 60  Ht 5\' 3"  (1.6 m)  Wt 114 lb (51.71 kg)  BMI 20.20 kg/m2  SpO2 94%  General appearance: alert and cooperative Neurologic: intact Heart: regular rate and rhythm, S1, S2 normal, no murmur, click, rub or gallop Lungs: clear to auscultation bilaterally Extremities: extremities normal, atraumatic, no cyanosis or edema, Homans sign is negative, no sign of DVT and no edema, redness or tenderness in the calves or thighs Wound: The right chest incision is well-healed  No cervical or axillary adenopathy  Diagnostic Studies & Laboratory data:         Recent Radiology Findings: Ct Chest Wo  Contrast  10/21/2014   CLINICAL DATA:  Follow-up pulmonary nodules, history of wedge resection  EXAM: CT CHEST WITHOUT CONTRAST  TECHNIQUE: Multidetector CT imaging of the chest was performed following the standard protocol without IV contrast.  COMPARISON:  10/01/2013 and 09/25/2012  FINDINGS: Tiny pulmonary nodules in the right upper lobe, including:  --2 mm nodule in the posterior right lung apex (series 4/image 13)  --2 mm nodule in the right upper lobe (series 4/ image 18)  --1-2 mm nodule in the anterior right upper lobe (series 4/ image 23)  Postsurgical changes related to prior right lower lobe wedge resection (series 4/image 51). Mild scarring/ bronchiectasis in the bilateral lower lobes. Mild emphysematous changes. No new/suspicious pulmonary nodules.  Visualized thyroid is unremarkable.  The heart is normal in size. No pericardial effusion. Atherosclerotic calcifications of the aortic arch.  Small mediastinal lymph nodes which do  not meet pathologic CT size criteria.  Visualized upper abdomen is notable for small upper abdominal lymph nodes which do not meet pathologic CT size criteria.  Degenerative changes of the visualized thoracolumbar spine.  IMPRESSION: Tiny right upper lobe pulmonary nodules measuring 1-2 mm, unchanged since 2013, benign.  No dedicated follow-up imaging is required per Fleischner Society guidelines.  This recommendation follows the consensus statement: Guidelines for Management of Small Pulmonary Nodules Detected on CT Scans: A Statement from the [employer] as published in Radiology 2005; 237:395-400.   Electronically Signed   By: Julian Hy M.D.   On: 10/21/2014 14:28  Ct Chest Wo Contrast  10/01/2013   *RADIOLOGY REPORT*  Clinical Data: 1 year follow up pulmonary nodules  CT CHEST WITHOUT CONTRAST  Technique:  Multidetector CT imaging of the chest was performed following the standard protocol without IV contrast.  Comparison: Prior chest CT 09/25/2012  Findings:  Mediastinum: Unremarkable CT appearance of the thyroid gland.  No suspicious mediastinal or hilar adenopathy.  No soft tissue mediastinal mass.  The thoracic esophagus is unremarkable.  Heart/Vascular: Limited evaluation in the absence of intravenous contrast.  Heart is within normal limits for size.  No pericardial effusion.  Conventional three-vessel arch anatomy.  Atherosclerotic calcifications noted throughout the ascending and transverse aorta. No aneurysmal dilatation.  Lungs/Pleura: No pericardial effusion.  Tiny 1 - 2 mm nodules in the right upper lobe not completely unchanged over the past year (image 14, series 4).  A third probable 2 mm pulmonary nodule also in the right upper lobe (image 22 series 4) is also unchanged.  No new or suspicious pulmonary nodule identified.  Stable parenchymal scarring with mild dystrophic calcification in the inferior aspect of the right lower lobe in a region of prior wedge resection.  The lungs  are hyperexpanded and there is mild bronchial wall thickening.  The lungs are otherwise clear.  Upper Abdomen: Limited evaluation of the upper abdomen demonstrates no acute abnormality.  Bones: No acute fracture or aggressive appearing lytic or blastic osseous lesion.  IMPRESSION:  1.  Stable tiny right upper lobe pulmonary nodules.  No further imaging followup is required for these lesions.  2.  Stable parenchymal scarring with mild dystrophic calcifications in the region of the prior right lower lobe wedge resection.   Original Report Authenticated By: Jacqulynn Cadet, M.D.      Recent Labs: Lab Results  Component Value Date   WBC 11.2* 02/21/2012   HGB 11.0* 02/21/2012   HCT 34.2* 02/21/2012   PLT 240 02/21/2012   GLUCOSE 99 02/21/2012   ALT 13 02/21/2012  AST 19 02/21/2012   NA 139 02/21/2012   K 4.1 02/21/2012   CL 105 02/21/2012   CREATININE 1.80* 08/13/2012   BUN 80* 08/13/2012   CO2 27 02/21/2012   INR 0.95 02/18/2012      Assessment / Plan:   Patient is stable following wedge resection of right lower lobe nodular densities which were granulomatous disease final path.  On followup yearly CT scan since 2013 there've been no further changes in the lung suggests a malignancy. I have not made the patient a followup CT scan because of the stability for at least 2 years.  Grace Isaac MD 10/21/2014 2:54 PM

## 2015-01-13 DIAGNOSIS — L723 Sebaceous cyst: Secondary | ICD-10-CM | POA: Diagnosis not present

## 2015-01-13 DIAGNOSIS — L089 Local infection of the skin and subcutaneous tissue, unspecified: Secondary | ICD-10-CM | POA: Diagnosis not present

## 2015-01-13 DIAGNOSIS — B078 Other viral warts: Secondary | ICD-10-CM | POA: Diagnosis not present

## 2015-01-13 DIAGNOSIS — L814 Other melanin hyperpigmentation: Secondary | ICD-10-CM | POA: Diagnosis not present

## 2015-01-13 DIAGNOSIS — L820 Inflamed seborrheic keratosis: Secondary | ICD-10-CM | POA: Diagnosis not present

## 2015-01-13 DIAGNOSIS — D485 Neoplasm of uncertain behavior of skin: Secondary | ICD-10-CM | POA: Diagnosis not present

## 2015-02-10 DIAGNOSIS — L905 Scar conditions and fibrosis of skin: Secondary | ICD-10-CM | POA: Diagnosis not present

## 2015-02-10 DIAGNOSIS — N183 Chronic kidney disease, stage 3 (moderate): Secondary | ICD-10-CM | POA: Diagnosis not present

## 2015-02-16 DIAGNOSIS — Z1389 Encounter for screening for other disorder: Secondary | ICD-10-CM | POA: Diagnosis not present

## 2015-02-16 DIAGNOSIS — Z23 Encounter for immunization: Secondary | ICD-10-CM | POA: Diagnosis not present

## 2015-02-16 DIAGNOSIS — Z91030 Bee allergy status: Secondary | ICD-10-CM | POA: Diagnosis not present

## 2015-02-16 DIAGNOSIS — F40243 Fear of flying: Secondary | ICD-10-CM | POA: Diagnosis not present

## 2015-02-16 DIAGNOSIS — M109 Gout, unspecified: Secondary | ICD-10-CM | POA: Diagnosis not present

## 2015-02-16 DIAGNOSIS — Z0001 Encounter for general adult medical examination with abnormal findings: Secondary | ICD-10-CM | POA: Diagnosis not present

## 2015-02-16 DIAGNOSIS — I10 Essential (primary) hypertension: Secondary | ICD-10-CM | POA: Diagnosis not present

## 2015-02-16 DIAGNOSIS — E782 Mixed hyperlipidemia: Secondary | ICD-10-CM | POA: Diagnosis not present

## 2015-02-16 DIAGNOSIS — E559 Vitamin D deficiency, unspecified: Secondary | ICD-10-CM | POA: Diagnosis not present

## 2015-02-16 DIAGNOSIS — R609 Edema, unspecified: Secondary | ICD-10-CM | POA: Diagnosis not present

## 2015-02-16 DIAGNOSIS — Z79899 Other long term (current) drug therapy: Secondary | ICD-10-CM | POA: Diagnosis not present

## 2015-02-16 DIAGNOSIS — G259 Extrapyramidal and movement disorder, unspecified: Secondary | ICD-10-CM | POA: Diagnosis not present

## 2015-03-03 DIAGNOSIS — Z85828 Personal history of other malignant neoplasm of skin: Secondary | ICD-10-CM | POA: Diagnosis not present

## 2015-03-03 DIAGNOSIS — L723 Sebaceous cyst: Secondary | ICD-10-CM | POA: Diagnosis not present

## 2015-04-22 ENCOUNTER — Encounter: Payer: Self-pay | Admitting: Podiatrist

## 2015-04-22 ENCOUNTER — Ambulatory Visit (INDEPENDENT_AMBULATORY_CARE_PROVIDER_SITE_OTHER): Payer: Medicare Other | Admitting: Podiatrist

## 2015-04-22 VITALS — BP 128/64 | HR 60 | Resp 12

## 2015-04-22 DIAGNOSIS — L84 Corns and callosities: Secondary | ICD-10-CM

## 2015-04-22 NOTE — Patient Instructions (Signed)
Corns and Calluses Corns are small areas of thickened skin that usually occur on the top, sides, or tip of a toe. They contain a cone-shaped core with a point that can press on a nerve below. This causes pain. Calluses are areas of thickened skin that usually develop on hands, fingers, palms, soles of the feet, and heels. These are areas that experience frequent friction or pressure. CAUSES  Corns are usually the result of rubbing (friction) or pressure from shoes that are too tight or do not fit properly. Calluses are caused by repeated friction and pressure on the affected areas. SYMPTOMS  A hard growth on the skin.  Pain or tenderness under the skin.  Sometimes, redness and swelling.  Increased discomfort while wearing tight-fitting shoes. DIAGNOSIS  Your caregiver can usually tell what the problem is by doing a physical exam. TREATMENT  Removing the cause of the friction or pressure is usually the only treatment needed. However, sometimes medicines can be used to help soften the hardened, thickened areas. These medicines include salicylic acid plasters and 12% ammonium lactate lotion. These medicines should only be used under the direction of your caregiver. HOME CARE INSTRUCTIONS   Try to remove pressure from the affected area.  You may wear donut-shaped corn pads to protect your skin.  You may use a pumice stone or nonmetallic nail file to gently reduce the thickness of a corn.  Wear properly fitted footwear.  If you have calluses on the hands, wear gloves during activities that cause friction.  If you have diabetes, you should regularly examine your feet. Tell your caregiver if you notice any problems with your feet. SEEK IMMEDIATE MEDICAL CARE IF:   You have increased pain, swelling, redness, or warmth in the affected area.  Your corn or callus starts to drain fluid or bleeds.  You are not getting better, even with treatment. Document Released: 09/22/2004 Document  Revised: 03/10/2012 Document Reviewed: 08/14/2011 [employer] Patient Information 2015 [employer]. This information is not intended to replace advice given to you by your health care provider. Make sure you discuss any questions you have with your health care provider.  

## 2015-04-22 NOTE — Progress Notes (Signed)
   Subjective:    Patient ID: April Burns, female    DOB: 12/24/1927, 81 y.o.   MRN: [id number]  HPI PT STATED RT FOOT LATERAL SIDE OF THE TOE IS BEEN PAINFUL AND HAVE THICK SKIN FOR 3 MONTHS. THE TOE IS NOT GETTING BETTER AND WORSE WHEN WEARING CERTAIN SHOES. TRIED CORN PAD AND IT HELP SOME.   Review of Systems  HENT: Positive for hearing loss and sinus pressure.   Hematological: Bruises/bleeds easily.       Objective:   Physical Exam Patient is awake, alert, and oriented x 3.  In no acute distress.  Vascular status is intact with palpable pedal pulses at 2/4 DP and PT bilateral and capillary refill time within normal limits. Neurological sensation is also intact bilaterally via Semmes Weinstein monofilament at 5/5 sites. Light touch, vibratory sensation, Achilles tendon reflex is intact. Dermatological exam reveals skin color, turger and texture as normal. No open lesions present.  Musculature intact with dorsiflexion, plantarflexion, inversion, eversion.  Thin foot type is present with fat pad atrophy noted bilateral feet-- well circumscribed porokeratotic lesion is present on the fifth metatarsal base of the right foot.  Intact integument is present post debridement.  No redness, swelling or signs of infection are present.       Assessment & Plan:  Porokeratotic lesion right foot  Plan:  Debridement of lesion accomplished today and salinocaine and aperture pad applied.  She will wear shoes with plenty of padding and if the lesion returns she will call.

## 2015-04-28 DIAGNOSIS — H903 Sensorineural hearing loss, bilateral: Secondary | ICD-10-CM | POA: Diagnosis not present

## 2015-05-16 DIAGNOSIS — J309 Allergic rhinitis, unspecified: Secondary | ICD-10-CM | POA: Diagnosis not present

## 2015-05-16 DIAGNOSIS — R601 Generalized edema: Secondary | ICD-10-CM | POA: Diagnosis not present

## 2015-05-16 DIAGNOSIS — M109 Gout, unspecified: Secondary | ICD-10-CM | POA: Diagnosis not present

## 2015-05-16 DIAGNOSIS — F40243 Fear of flying: Secondary | ICD-10-CM | POA: Diagnosis not present

## 2015-05-16 DIAGNOSIS — N183 Chronic kidney disease, stage 3 (moderate): Secondary | ICD-10-CM | POA: Diagnosis not present

## 2015-05-16 DIAGNOSIS — I10 Essential (primary) hypertension: Secondary | ICD-10-CM | POA: Diagnosis not present

## 2015-05-16 DIAGNOSIS — Z91030 Bee allergy status: Secondary | ICD-10-CM | POA: Diagnosis not present

## 2015-05-16 DIAGNOSIS — Z79899 Other long term (current) drug therapy: Secondary | ICD-10-CM | POA: Diagnosis not present

## 2015-05-27 DIAGNOSIS — Z961 Presence of intraocular lens: Secondary | ICD-10-CM | POA: Diagnosis not present

## 2015-05-27 DIAGNOSIS — H52203 Unspecified astigmatism, bilateral: Secondary | ICD-10-CM | POA: Diagnosis not present

## 2015-05-27 DIAGNOSIS — H43813 Vitreous degeneration, bilateral: Secondary | ICD-10-CM | POA: Diagnosis not present

## 2015-11-11 ENCOUNTER — Other Ambulatory Visit: Payer: Self-pay | Admitting: Internal Medicine

## 2015-11-11 ENCOUNTER — Ambulatory Visit
Admission: RE | Admit: 2015-11-11 | Discharge: 2015-11-11 | Disposition: A | Discharge status: Home / Self Care | Payer: Medicare Other | Source: Ambulatory Visit | Attending: Internal Medicine | Admitting: Internal Medicine

## 2015-11-11 DIAGNOSIS — R109 Unspecified abdominal pain: Secondary | ICD-10-CM

## 2015-11-11 DIAGNOSIS — N63 Unspecified lump in breast: Secondary | ICD-10-CM | POA: Diagnosis not present

## 2015-11-11 DIAGNOSIS — M5134 Other intervertebral disc degeneration, thoracic region: Secondary | ICD-10-CM | POA: Diagnosis not present

## 2015-11-14 DIAGNOSIS — N63 Unspecified lump in breast: Secondary | ICD-10-CM | POA: Diagnosis not present

## 2015-11-17 ENCOUNTER — Other Ambulatory Visit: Payer: Self-pay | Admitting: Internal Medicine

## 2015-11-17 DIAGNOSIS — M546 Pain in thoracic spine: Secondary | ICD-10-CM

## 2015-11-30 ENCOUNTER — Ambulatory Visit
Admission: RE | Admit: 2015-11-30 | Discharge: 2015-11-30 | Disposition: A | Discharge status: Home / Self Care | Payer: Medicare Other | Source: Ambulatory Visit | Attending: Internal Medicine | Admitting: Internal Medicine

## 2015-11-30 DIAGNOSIS — M546 Pain in thoracic spine: Secondary | ICD-10-CM

## 2015-11-30 DIAGNOSIS — M5124 Other intervertebral disc displacement, thoracic region: Secondary | ICD-10-CM | POA: Diagnosis not present

## 2015-12-09 DIAGNOSIS — M109 Gout, unspecified: Secondary | ICD-10-CM | POA: Diagnosis not present

## 2015-12-09 DIAGNOSIS — I10 Essential (primary) hypertension: Secondary | ICD-10-CM | POA: Diagnosis not present

## 2015-12-09 DIAGNOSIS — N63 Unspecified lump in breast: Secondary | ICD-10-CM | POA: Diagnosis not present

## 2015-12-09 DIAGNOSIS — M546 Pain in thoracic spine: Secondary | ICD-10-CM | POA: Diagnosis not present

## 2016-01-12 DIAGNOSIS — N63 Unspecified lump in breast: Secondary | ICD-10-CM | POA: Diagnosis not present

## 2016-04-18 DIAGNOSIS — Z0001 Encounter for general adult medical examination with abnormal findings: Secondary | ICD-10-CM | POA: Diagnosis not present

## 2016-04-18 DIAGNOSIS — J309 Allergic rhinitis, unspecified: Secondary | ICD-10-CM | POA: Diagnosis not present

## 2016-04-18 DIAGNOSIS — M109 Gout, unspecified: Secondary | ICD-10-CM | POA: Diagnosis not present

## 2016-04-18 DIAGNOSIS — Z1389 Encounter for screening for other disorder: Secondary | ICD-10-CM | POA: Diagnosis not present

## 2016-04-18 DIAGNOSIS — M546 Pain in thoracic spine: Secondary | ICD-10-CM | POA: Diagnosis not present

## 2016-04-18 DIAGNOSIS — Z91030 Bee allergy status: Secondary | ICD-10-CM | POA: Diagnosis not present

## 2016-04-18 DIAGNOSIS — N183 Chronic kidney disease, stage 3 (moderate): Secondary | ICD-10-CM | POA: Diagnosis not present

## 2016-04-18 DIAGNOSIS — R601 Generalized edema: Secondary | ICD-10-CM | POA: Diagnosis not present

## 2016-04-18 DIAGNOSIS — N63 Unspecified lump in breast: Secondary | ICD-10-CM | POA: Diagnosis not present

## 2016-04-18 DIAGNOSIS — I10 Essential (primary) hypertension: Secondary | ICD-10-CM | POA: Diagnosis not present

## 2016-04-18 DIAGNOSIS — F40243 Fear of flying: Secondary | ICD-10-CM | POA: Diagnosis not present

## 2016-04-18 DIAGNOSIS — Z79899 Other long term (current) drug therapy: Secondary | ICD-10-CM | POA: Diagnosis not present

## 2016-04-18 DIAGNOSIS — I6529 Occlusion and stenosis of unspecified carotid artery: Secondary | ICD-10-CM | POA: Diagnosis not present

## 2016-04-19 ENCOUNTER — Other Ambulatory Visit: Payer: Self-pay | Admitting: Internal Medicine

## 2016-04-19 DIAGNOSIS — I6529 Occlusion and stenosis of unspecified carotid artery: Secondary | ICD-10-CM

## 2016-04-27 ENCOUNTER — Ambulatory Visit
Admission: RE | Admit: 2016-04-27 | Discharge: 2016-04-27 | Disposition: A | Discharge status: Home / Self Care | Payer: Medicare Other | Source: Ambulatory Visit | Attending: Internal Medicine | Admitting: Internal Medicine

## 2016-04-27 DIAGNOSIS — I6523 Occlusion and stenosis of bilateral carotid arteries: Secondary | ICD-10-CM | POA: Diagnosis not present

## 2016-04-27 DIAGNOSIS — I6529 Occlusion and stenosis of unspecified carotid artery: Secondary | ICD-10-CM

## 2016-05-30 DIAGNOSIS — L821 Other seborrheic keratosis: Secondary | ICD-10-CM | POA: Diagnosis not present

## 2016-05-30 DIAGNOSIS — C44619 Basal cell carcinoma of skin of left upper limb, including shoulder: Secondary | ICD-10-CM | POA: Diagnosis not present

## 2016-05-30 DIAGNOSIS — C4371 Malignant melanoma of right lower limb, including hip: Secondary | ICD-10-CM | POA: Diagnosis not present

## 2016-05-30 DIAGNOSIS — D485 Neoplasm of uncertain behavior of skin: Secondary | ICD-10-CM | POA: Diagnosis not present

## 2016-05-30 DIAGNOSIS — Z85828 Personal history of other malignant neoplasm of skin: Secondary | ICD-10-CM | POA: Diagnosis not present

## 2016-05-30 DIAGNOSIS — L649 Androgenic alopecia, unspecified: Secondary | ICD-10-CM | POA: Diagnosis not present

## 2016-06-01 DIAGNOSIS — H43813 Vitreous degeneration, bilateral: Secondary | ICD-10-CM | POA: Diagnosis not present

## 2016-06-01 DIAGNOSIS — Z961 Presence of intraocular lens: Secondary | ICD-10-CM | POA: Diagnosis not present

## 2016-06-14 DIAGNOSIS — D0371 Melanoma in situ of right lower limb, including hip: Secondary | ICD-10-CM | POA: Diagnosis not present

## 2016-07-05 DIAGNOSIS — C7651 Malignant neoplasm of right lower limb: Secondary | ICD-10-CM | POA: Diagnosis not present

## 2016-07-05 DIAGNOSIS — D0371 Melanoma in situ of right lower limb, including hip: Secondary | ICD-10-CM | POA: Diagnosis not present

## 2016-07-05 DIAGNOSIS — C4371 Malignant melanoma of right lower limb, including hip: Secondary | ICD-10-CM | POA: Diagnosis not present

## 2016-10-10 DIAGNOSIS — D1801 Hemangioma of skin and subcutaneous tissue: Secondary | ICD-10-CM | POA: Diagnosis not present

## 2016-10-10 DIAGNOSIS — D2271 Melanocytic nevi of right lower limb, including hip: Secondary | ICD-10-CM | POA: Diagnosis not present

## 2016-10-10 DIAGNOSIS — Z85828 Personal history of other malignant neoplasm of skin: Secondary | ICD-10-CM | POA: Diagnosis not present

## 2016-10-10 DIAGNOSIS — Z85820 Personal history of malignant melanoma of skin: Secondary | ICD-10-CM | POA: Diagnosis not present

## 2016-10-10 DIAGNOSIS — L821 Other seborrheic keratosis: Secondary | ICD-10-CM | POA: Diagnosis not present

## 2016-10-10 DIAGNOSIS — D2272 Melanocytic nevi of left lower limb, including hip: Secondary | ICD-10-CM | POA: Diagnosis not present

## 2016-10-10 DIAGNOSIS — L814 Other melanin hyperpigmentation: Secondary | ICD-10-CM | POA: Diagnosis not present

## 2016-10-19 ENCOUNTER — Other Ambulatory Visit: Payer: Self-pay | Admitting: Internal Medicine

## 2016-10-19 ENCOUNTER — Ambulatory Visit
Admission: RE | Admit: 2016-10-19 | Discharge: 2016-10-19 | Disposition: A | Discharge status: Home / Self Care | Payer: Medicare Other | Source: Ambulatory Visit | Attending: Internal Medicine | Admitting: Internal Medicine

## 2016-10-19 DIAGNOSIS — Z79899 Other long term (current) drug therapy: Secondary | ICD-10-CM | POA: Diagnosis not present

## 2016-10-19 DIAGNOSIS — R1010 Upper abdominal pain, unspecified: Secondary | ICD-10-CM

## 2016-10-19 DIAGNOSIS — R109 Unspecified abdominal pain: Secondary | ICD-10-CM

## 2016-10-19 DIAGNOSIS — J309 Allergic rhinitis, unspecified: Secondary | ICD-10-CM | POA: Diagnosis not present

## 2016-10-19 DIAGNOSIS — F40243 Fear of flying: Secondary | ICD-10-CM | POA: Diagnosis not present

## 2016-10-19 DIAGNOSIS — J329 Chronic sinusitis, unspecified: Secondary | ICD-10-CM | POA: Diagnosis not present

## 2016-10-19 DIAGNOSIS — M546 Pain in thoracic spine: Secondary | ICD-10-CM

## 2016-10-19 DIAGNOSIS — Z91030 Bee allergy status: Secondary | ICD-10-CM | POA: Diagnosis not present

## 2016-10-19 DIAGNOSIS — Z23 Encounter for immunization: Secondary | ICD-10-CM | POA: Diagnosis not present

## 2016-10-19 DIAGNOSIS — R079 Chest pain, unspecified: Secondary | ICD-10-CM | POA: Diagnosis not present

## 2016-10-19 DIAGNOSIS — I6529 Occlusion and stenosis of unspecified carotid artery: Secondary | ICD-10-CM | POA: Diagnosis not present

## 2016-10-19 DIAGNOSIS — I10 Essential (primary) hypertension: Secondary | ICD-10-CM | POA: Diagnosis not present

## 2016-10-19 DIAGNOSIS — M109 Gout, unspecified: Secondary | ICD-10-CM | POA: Diagnosis not present

## 2016-10-30 ENCOUNTER — Ambulatory Visit
Admission: RE | Admit: 2016-10-30 | Discharge: 2016-10-30 | Disposition: A | Discharge status: Home / Self Care | Payer: Medicare Other | Source: Ambulatory Visit | Attending: Internal Medicine | Admitting: Internal Medicine

## 2016-10-30 DIAGNOSIS — R1010 Upper abdominal pain, unspecified: Secondary | ICD-10-CM

## 2016-10-30 DIAGNOSIS — R109 Unspecified abdominal pain: Secondary | ICD-10-CM | POA: Diagnosis not present

## 2016-11-01 DIAGNOSIS — M546 Pain in thoracic spine: Secondary | ICD-10-CM | POA: Diagnosis not present

## 2016-11-01 DIAGNOSIS — R1010 Upper abdominal pain, unspecified: Secondary | ICD-10-CM | POA: Diagnosis not present

## 2016-11-30 DIAGNOSIS — R05 Cough: Secondary | ICD-10-CM | POA: Diagnosis not present

## 2016-11-30 DIAGNOSIS — R091 Pleurisy: Secondary | ICD-10-CM | POA: Diagnosis not present

## 2016-11-30 DIAGNOSIS — J209 Acute bronchitis, unspecified: Secondary | ICD-10-CM | POA: Diagnosis not present

## 2017-01-25 DIAGNOSIS — L814 Other melanin hyperpigmentation: Secondary | ICD-10-CM | POA: Diagnosis not present

## 2017-01-25 DIAGNOSIS — Z85828 Personal history of other malignant neoplasm of skin: Secondary | ICD-10-CM | POA: Diagnosis not present

## 2017-01-25 DIAGNOSIS — L821 Other seborrheic keratosis: Secondary | ICD-10-CM | POA: Diagnosis not present

## 2017-01-25 DIAGNOSIS — D692 Other nonthrombocytopenic purpura: Secondary | ICD-10-CM | POA: Diagnosis not present

## 2017-01-25 DIAGNOSIS — D2272 Melanocytic nevi of left lower limb, including hip: Secondary | ICD-10-CM | POA: Diagnosis not present

## 2017-01-25 DIAGNOSIS — D225 Melanocytic nevi of trunk: Secondary | ICD-10-CM | POA: Diagnosis not present

## 2017-01-25 DIAGNOSIS — Z85820 Personal history of malignant melanoma of skin: Secondary | ICD-10-CM | POA: Diagnosis not present

## 2017-01-25 DIAGNOSIS — D2271 Melanocytic nevi of right lower limb, including hip: Secondary | ICD-10-CM | POA: Diagnosis not present

## 2017-04-16 DIAGNOSIS — H903 Sensorineural hearing loss, bilateral: Secondary | ICD-10-CM | POA: Diagnosis not present

## 2017-04-17 DIAGNOSIS — D2272 Melanocytic nevi of left lower limb, including hip: Secondary | ICD-10-CM | POA: Diagnosis not present

## 2017-04-17 DIAGNOSIS — D485 Neoplasm of uncertain behavior of skin: Secondary | ICD-10-CM | POA: Diagnosis not present

## 2017-04-17 DIAGNOSIS — Z85828 Personal history of other malignant neoplasm of skin: Secondary | ICD-10-CM | POA: Diagnosis not present

## 2017-04-17 DIAGNOSIS — L821 Other seborrheic keratosis: Secondary | ICD-10-CM | POA: Diagnosis not present

## 2017-04-17 DIAGNOSIS — L439 Lichen planus, unspecified: Secondary | ICD-10-CM | POA: Diagnosis not present

## 2017-04-17 DIAGNOSIS — L814 Other melanin hyperpigmentation: Secondary | ICD-10-CM | POA: Diagnosis not present

## 2017-04-17 DIAGNOSIS — L438 Other lichen planus: Secondary | ICD-10-CM | POA: Diagnosis not present

## 2017-04-17 DIAGNOSIS — D2239 Melanocytic nevi of other parts of face: Secondary | ICD-10-CM | POA: Diagnosis not present

## 2017-04-17 DIAGNOSIS — D2271 Melanocytic nevi of right lower limb, including hip: Secondary | ICD-10-CM | POA: Diagnosis not present

## 2017-04-17 DIAGNOSIS — D1801 Hemangioma of skin and subcutaneous tissue: Secondary | ICD-10-CM | POA: Diagnosis not present

## 2017-04-17 DIAGNOSIS — D692 Other nonthrombocytopenic purpura: Secondary | ICD-10-CM | POA: Diagnosis not present

## 2017-04-17 DIAGNOSIS — D225 Melanocytic nevi of trunk: Secondary | ICD-10-CM | POA: Diagnosis not present

## 2017-04-17 DIAGNOSIS — Z85820 Personal history of malignant melanoma of skin: Secondary | ICD-10-CM | POA: Diagnosis not present

## 2017-04-25 DIAGNOSIS — J329 Chronic sinusitis, unspecified: Secondary | ICD-10-CM | POA: Diagnosis not present

## 2017-04-25 DIAGNOSIS — J309 Allergic rhinitis, unspecified: Secondary | ICD-10-CM | POA: Diagnosis not present

## 2017-04-25 DIAGNOSIS — Z1389 Encounter for screening for other disorder: Secondary | ICD-10-CM | POA: Diagnosis not present

## 2017-04-25 DIAGNOSIS — I10 Essential (primary) hypertension: Secondary | ICD-10-CM | POA: Diagnosis not present

## 2017-04-25 DIAGNOSIS — Z79899 Other long term (current) drug therapy: Secondary | ICD-10-CM | POA: Diagnosis not present

## 2017-04-25 DIAGNOSIS — M546 Pain in thoracic spine: Secondary | ICD-10-CM | POA: Diagnosis not present

## 2017-04-25 DIAGNOSIS — Z23 Encounter for immunization: Secondary | ICD-10-CM | POA: Diagnosis not present

## 2017-04-25 DIAGNOSIS — F40243 Fear of flying: Secondary | ICD-10-CM | POA: Diagnosis not present

## 2017-04-25 DIAGNOSIS — Z91030 Bee allergy status: Secondary | ICD-10-CM | POA: Diagnosis not present

## 2017-04-25 DIAGNOSIS — I6529 Occlusion and stenosis of unspecified carotid artery: Secondary | ICD-10-CM | POA: Diagnosis not present

## 2017-04-25 DIAGNOSIS — R1010 Upper abdominal pain, unspecified: Secondary | ICD-10-CM | POA: Diagnosis not present

## 2017-04-25 DIAGNOSIS — Z0000 Encounter for general adult medical examination without abnormal findings: Secondary | ICD-10-CM | POA: Diagnosis not present

## 2017-04-25 DIAGNOSIS — E559 Vitamin D deficiency, unspecified: Secondary | ICD-10-CM | POA: Diagnosis not present

## 2017-04-25 DIAGNOSIS — M109 Gout, unspecified: Secondary | ICD-10-CM | POA: Diagnosis not present

## 2017-05-24 DIAGNOSIS — Z961 Presence of intraocular lens: Secondary | ICD-10-CM | POA: Diagnosis not present

## 2017-05-24 DIAGNOSIS — H52203 Unspecified astigmatism, bilateral: Secondary | ICD-10-CM | POA: Diagnosis not present

## 2017-05-24 DIAGNOSIS — H43813 Vitreous degeneration, bilateral: Secondary | ICD-10-CM | POA: Diagnosis not present

## 2017-05-24 DIAGNOSIS — H524 Presbyopia: Secondary | ICD-10-CM | POA: Diagnosis not present

## 2017-10-11 DIAGNOSIS — Z23 Encounter for immunization: Secondary | ICD-10-CM | POA: Diagnosis not present

## 2017-10-18 DIAGNOSIS — Z85828 Personal history of other malignant neoplasm of skin: Secondary | ICD-10-CM | POA: Diagnosis not present

## 2017-10-18 DIAGNOSIS — D2272 Melanocytic nevi of left lower limb, including hip: Secondary | ICD-10-CM | POA: Diagnosis not present

## 2017-10-18 DIAGNOSIS — D225 Melanocytic nevi of trunk: Secondary | ICD-10-CM | POA: Diagnosis not present

## 2017-10-18 DIAGNOSIS — Z85820 Personal history of malignant melanoma of skin: Secondary | ICD-10-CM | POA: Diagnosis not present

## 2017-10-18 DIAGNOSIS — L812 Freckles: Secondary | ICD-10-CM | POA: Diagnosis not present

## 2017-10-18 DIAGNOSIS — D2271 Melanocytic nevi of right lower limb, including hip: Secondary | ICD-10-CM | POA: Diagnosis not present

## 2017-10-18 DIAGNOSIS — D485 Neoplasm of uncertain behavior of skin: Secondary | ICD-10-CM | POA: Diagnosis not present

## 2017-10-18 DIAGNOSIS — D692 Other nonthrombocytopenic purpura: Secondary | ICD-10-CM | POA: Diagnosis not present

## 2017-10-18 DIAGNOSIS — L814 Other melanin hyperpigmentation: Secondary | ICD-10-CM | POA: Diagnosis not present

## 2017-10-18 DIAGNOSIS — L821 Other seborrheic keratosis: Secondary | ICD-10-CM | POA: Diagnosis not present

## 2017-10-29 DIAGNOSIS — R5382 Chronic fatigue, unspecified: Secondary | ICD-10-CM | POA: Diagnosis not present

## 2017-10-29 DIAGNOSIS — M109 Gout, unspecified: Secondary | ICD-10-CM | POA: Diagnosis not present

## 2017-10-29 DIAGNOSIS — E559 Vitamin D deficiency, unspecified: Secondary | ICD-10-CM | POA: Diagnosis not present

## 2017-10-29 DIAGNOSIS — C4490 Unspecified malignant neoplasm of skin, unspecified: Secondary | ICD-10-CM | POA: Diagnosis not present

## 2017-10-29 DIAGNOSIS — I10 Essential (primary) hypertension: Secondary | ICD-10-CM | POA: Diagnosis not present

## 2017-10-29 DIAGNOSIS — G4700 Insomnia, unspecified: Secondary | ICD-10-CM | POA: Diagnosis not present

## 2017-10-29 DIAGNOSIS — J309 Allergic rhinitis, unspecified: Secondary | ICD-10-CM | POA: Diagnosis not present

## 2017-10-29 DIAGNOSIS — Z79899 Other long term (current) drug therapy: Secondary | ICD-10-CM | POA: Diagnosis not present

## 2017-10-29 DIAGNOSIS — I6529 Occlusion and stenosis of unspecified carotid artery: Secondary | ICD-10-CM | POA: Diagnosis not present

## 2017-11-14 DIAGNOSIS — D2271 Melanocytic nevi of right lower limb, including hip: Secondary | ICD-10-CM | POA: Diagnosis not present

## 2017-11-14 DIAGNOSIS — Z85828 Personal history of other malignant neoplasm of skin: Secondary | ICD-10-CM | POA: Diagnosis not present

## 2017-11-14 DIAGNOSIS — D485 Neoplasm of uncertain behavior of skin: Secondary | ICD-10-CM | POA: Diagnosis not present

## 2017-11-14 DIAGNOSIS — Z85820 Personal history of malignant melanoma of skin: Secondary | ICD-10-CM | POA: Diagnosis not present

## 2017-11-29 DIAGNOSIS — M169 Osteoarthritis of hip, unspecified: Secondary | ICD-10-CM | POA: Diagnosis not present

## 2017-11-29 DIAGNOSIS — E782 Mixed hyperlipidemia: Secondary | ICD-10-CM | POA: Diagnosis not present

## 2017-11-29 DIAGNOSIS — E7800 Pure hypercholesterolemia, unspecified: Secondary | ICD-10-CM | POA: Diagnosis not present

## 2017-11-29 DIAGNOSIS — J441 Chronic obstructive pulmonary disease with (acute) exacerbation: Secondary | ICD-10-CM | POA: Diagnosis not present

## 2017-11-29 DIAGNOSIS — E785 Hyperlipidemia, unspecified: Secondary | ICD-10-CM | POA: Diagnosis not present

## 2017-11-29 DIAGNOSIS — I10 Essential (primary) hypertension: Secondary | ICD-10-CM | POA: Diagnosis not present

## 2017-11-29 DIAGNOSIS — N183 Chronic kidney disease, stage 3 (moderate): Secondary | ICD-10-CM | POA: Diagnosis not present

## 2017-12-13 DIAGNOSIS — R899 Unspecified abnormal finding in specimens from other organs, systems and tissues: Secondary | ICD-10-CM | POA: Diagnosis not present

## 2017-12-13 DIAGNOSIS — D649 Anemia, unspecified: Secondary | ICD-10-CM | POA: Diagnosis not present

## 2018-01-08 DIAGNOSIS — H04223 Epiphora due to insufficient drainage, bilateral lacrimal glands: Secondary | ICD-10-CM | POA: Diagnosis not present

## 2018-03-07 DIAGNOSIS — M436 Torticollis: Secondary | ICD-10-CM | POA: Diagnosis not present

## 2018-05-01 DIAGNOSIS — R899 Unspecified abnormal finding in specimens from other organs, systems and tissues: Secondary | ICD-10-CM | POA: Diagnosis not present

## 2018-05-07 DIAGNOSIS — D692 Other nonthrombocytopenic purpura: Secondary | ICD-10-CM | POA: Diagnosis not present

## 2018-05-07 DIAGNOSIS — J321 Chronic frontal sinusitis: Secondary | ICD-10-CM | POA: Diagnosis not present

## 2018-05-07 DIAGNOSIS — Z85828 Personal history of other malignant neoplasm of skin: Secondary | ICD-10-CM | POA: Diagnosis not present

## 2018-05-07 DIAGNOSIS — D2261 Melanocytic nevi of right upper limb, including shoulder: Secondary | ICD-10-CM | POA: Diagnosis not present

## 2018-05-07 DIAGNOSIS — Z13820 Encounter for screening for osteoporosis: Secondary | ICD-10-CM | POA: Diagnosis not present

## 2018-05-07 DIAGNOSIS — L821 Other seborrheic keratosis: Secondary | ICD-10-CM | POA: Diagnosis not present

## 2018-05-07 DIAGNOSIS — Z91030 Bee allergy status: Secondary | ICD-10-CM | POA: Diagnosis not present

## 2018-05-07 DIAGNOSIS — N183 Chronic kidney disease, stage 3 (moderate): Secondary | ICD-10-CM | POA: Diagnosis not present

## 2018-05-07 DIAGNOSIS — Z85820 Personal history of malignant melanoma of skin: Secondary | ICD-10-CM | POA: Diagnosis not present

## 2018-05-07 DIAGNOSIS — E559 Vitamin D deficiency, unspecified: Secondary | ICD-10-CM | POA: Diagnosis not present

## 2018-05-07 DIAGNOSIS — D2272 Melanocytic nevi of left lower limb, including hip: Secondary | ICD-10-CM | POA: Diagnosis not present

## 2018-05-07 DIAGNOSIS — F40243 Fear of flying: Secondary | ICD-10-CM | POA: Diagnosis not present

## 2018-05-07 DIAGNOSIS — Z1389 Encounter for screening for other disorder: Secondary | ICD-10-CM | POA: Diagnosis not present

## 2018-05-07 DIAGNOSIS — E782 Mixed hyperlipidemia: Secondary | ICD-10-CM | POA: Diagnosis not present

## 2018-05-07 DIAGNOSIS — Z0000 Encounter for general adult medical examination without abnormal findings: Secondary | ICD-10-CM | POA: Diagnosis not present

## 2018-05-07 DIAGNOSIS — D225 Melanocytic nevi of trunk: Secondary | ICD-10-CM | POA: Diagnosis not present

## 2018-05-07 DIAGNOSIS — M109 Gout, unspecified: Secondary | ICD-10-CM | POA: Diagnosis not present

## 2018-05-07 DIAGNOSIS — I10 Essential (primary) hypertension: Secondary | ICD-10-CM | POA: Diagnosis not present

## 2018-05-13 DIAGNOSIS — J441 Chronic obstructive pulmonary disease with (acute) exacerbation: Secondary | ICD-10-CM | POA: Diagnosis not present

## 2018-05-13 DIAGNOSIS — M169 Osteoarthritis of hip, unspecified: Secondary | ICD-10-CM | POA: Diagnosis not present

## 2018-05-13 DIAGNOSIS — E7800 Pure hypercholesterolemia, unspecified: Secondary | ICD-10-CM | POA: Diagnosis not present

## 2018-05-13 DIAGNOSIS — N183 Chronic kidney disease, stage 3 (moderate): Secondary | ICD-10-CM | POA: Diagnosis not present

## 2018-05-13 DIAGNOSIS — I10 Essential (primary) hypertension: Secondary | ICD-10-CM | POA: Diagnosis not present

## 2018-05-15 DIAGNOSIS — I10 Essential (primary) hypertension: Secondary | ICD-10-CM | POA: Diagnosis not present

## 2018-05-15 DIAGNOSIS — J321 Chronic frontal sinusitis: Secondary | ICD-10-CM | POA: Diagnosis not present

## 2018-05-23 DIAGNOSIS — Z961 Presence of intraocular lens: Secondary | ICD-10-CM | POA: Diagnosis not present

## 2018-05-23 DIAGNOSIS — H52203 Unspecified astigmatism, bilateral: Secondary | ICD-10-CM | POA: Diagnosis not present

## 2018-09-24 DIAGNOSIS — M810 Age-related osteoporosis without current pathological fracture: Secondary | ICD-10-CM | POA: Diagnosis not present

## 2018-09-24 DIAGNOSIS — M8588 Other specified disorders of bone density and structure, other site: Secondary | ICD-10-CM | POA: Diagnosis not present

## 2018-09-25 DIAGNOSIS — E782 Mixed hyperlipidemia: Secondary | ICD-10-CM | POA: Diagnosis not present

## 2018-09-25 DIAGNOSIS — N183 Chronic kidney disease, stage 3 (moderate): Secondary | ICD-10-CM | POA: Diagnosis not present

## 2018-09-25 DIAGNOSIS — M169 Osteoarthritis of hip, unspecified: Secondary | ICD-10-CM | POA: Diagnosis not present

## 2018-09-25 DIAGNOSIS — J441 Chronic obstructive pulmonary disease with (acute) exacerbation: Secondary | ICD-10-CM | POA: Diagnosis not present

## 2018-09-25 DIAGNOSIS — M810 Age-related osteoporosis without current pathological fracture: Secondary | ICD-10-CM | POA: Diagnosis not present

## 2018-09-25 DIAGNOSIS — I10 Essential (primary) hypertension: Secondary | ICD-10-CM | POA: Diagnosis not present

## 2018-09-29 DIAGNOSIS — Z23 Encounter for immunization: Secondary | ICD-10-CM | POA: Diagnosis not present

## 2018-09-29 DIAGNOSIS — M810 Age-related osteoporosis without current pathological fracture: Secondary | ICD-10-CM | POA: Diagnosis not present

## 2018-09-29 DIAGNOSIS — R040 Epistaxis: Secondary | ICD-10-CM | POA: Diagnosis not present

## 2018-10-29 ENCOUNTER — Emergency Department (HOSPITAL_COMMUNITY): Payer: Medicare Other

## 2018-10-29 ENCOUNTER — Encounter (HOSPITAL_COMMUNITY): Payer: Self-pay | Admitting: Emergency Medicine

## 2018-10-29 ENCOUNTER — Emergency Department (HOSPITAL_COMMUNITY)
Admission: EM | Admit: 2018-10-29 | Discharge: 2018-10-29 | Disposition: A | Discharge status: Home / Self Care | Payer: Medicare Other | Attending: Emergency Medicine | Admitting: Emergency Medicine

## 2018-10-29 DIAGNOSIS — S0083XA Contusion of other part of head, initial encounter: Secondary | ICD-10-CM | POA: Diagnosis not present

## 2018-10-29 DIAGNOSIS — M79642 Pain in left hand: Secondary | ICD-10-CM | POA: Insufficient documentation

## 2018-10-29 DIAGNOSIS — R42 Dizziness and giddiness: Secondary | ICD-10-CM | POA: Diagnosis not present

## 2018-10-29 DIAGNOSIS — R102 Pelvic and perineal pain: Secondary | ICD-10-CM | POA: Diagnosis not present

## 2018-10-29 DIAGNOSIS — W19XXXA Unspecified fall, initial encounter: Secondary | ICD-10-CM | POA: Diagnosis not present

## 2018-10-29 DIAGNOSIS — N179 Acute kidney failure, unspecified: Secondary | ICD-10-CM | POA: Insufficient documentation

## 2018-10-29 DIAGNOSIS — S199XXA Unspecified injury of neck, initial encounter: Secondary | ICD-10-CM | POA: Diagnosis not present

## 2018-10-29 DIAGNOSIS — S20211A Contusion of right front wall of thorax, initial encounter: Secondary | ICD-10-CM | POA: Diagnosis not present

## 2018-10-29 DIAGNOSIS — Y9389 Activity, other specified: Secondary | ICD-10-CM | POA: Diagnosis not present

## 2018-10-29 DIAGNOSIS — Z87891 Personal history of nicotine dependence: Secondary | ICD-10-CM | POA: Diagnosis not present

## 2018-10-29 DIAGNOSIS — J410 Simple chronic bronchitis: Secondary | ICD-10-CM | POA: Diagnosis not present

## 2018-10-29 DIAGNOSIS — Z79899 Other long term (current) drug therapy: Secondary | ICD-10-CM | POA: Diagnosis not present

## 2018-10-29 DIAGNOSIS — S3993XA Unspecified injury of pelvis, initial encounter: Secondary | ICD-10-CM | POA: Diagnosis not present

## 2018-10-29 DIAGNOSIS — Y92009 Unspecified place in unspecified non-institutional (private) residence as the place of occurrence of the external cause: Secondary | ICD-10-CM | POA: Diagnosis not present

## 2018-10-29 DIAGNOSIS — M542 Cervicalgia: Secondary | ICD-10-CM | POA: Diagnosis not present

## 2018-10-29 DIAGNOSIS — I10 Essential (primary) hypertension: Secondary | ICD-10-CM | POA: Insufficient documentation

## 2018-10-29 DIAGNOSIS — W0110XA Fall on same level from slipping, tripping and stumbling with subsequent striking against unspecified object, initial encounter: Secondary | ICD-10-CM | POA: Insufficient documentation

## 2018-10-29 DIAGNOSIS — Y999 Unspecified external cause status: Secondary | ICD-10-CM | POA: Diagnosis not present

## 2018-10-29 DIAGNOSIS — S0990XA Unspecified injury of head, initial encounter: Secondary | ICD-10-CM | POA: Diagnosis not present

## 2018-10-29 DIAGNOSIS — R58 Hemorrhage, not elsewhere classified: Secondary | ICD-10-CM | POA: Diagnosis not present

## 2018-10-29 DIAGNOSIS — R51 Headache: Secondary | ICD-10-CM | POA: Diagnosis not present

## 2018-10-29 DIAGNOSIS — S6992XA Unspecified injury of left wrist, hand and finger(s), initial encounter: Secondary | ICD-10-CM | POA: Diagnosis not present

## 2018-10-29 LAB — BASIC METABOLIC PANEL
Anion gap: 9 (ref 5–15)
BUN: 48 mg/dL — ABNORMAL HIGH (ref 8–23)
CHLORIDE: 106 mmol/L (ref 98–111)
CO2: 25 mmol/L (ref 22–32)
CREATININE: 1.46 mg/dL — AB (ref 0.44–1.00)
Calcium: 9.2 mg/dL (ref 8.9–10.3)
GFR calc non Af Amer: 31 mL/min — ABNORMAL LOW (ref >60)
GFR, EST AFRICAN AMERICAN: 36 mL/min — AB (ref >60)
Glucose, Bld: 102 mg/dL — ABNORMAL HIGH (ref 70–99)
POTASSIUM: 4 mmol/L (ref 3.5–5.1)
Sodium: 140 mmol/L (ref 135–145)

## 2018-10-29 LAB — URINALYSIS, ROUTINE W REFLEX MICROSCOPIC
BACTERIA UA: NONE SEEN
BILIRUBIN URINE: NEGATIVE
Glucose, UA: NEGATIVE mg/dL
KETONES UR: NEGATIVE mg/dL
LEUKOCYTES UA: NEGATIVE
NITRITE: NEGATIVE
PH: 6 (ref 5.0–8.0)
PROTEIN: NEGATIVE mg/dL
Specific Gravity, Urine: 1.009 (ref 1.005–1.030)

## 2018-10-29 LAB — CBC WITH DIFFERENTIAL/PLATELET
ABS IMMATURE GRANULOCYTES: 0.03 10*3/uL (ref 0.00–0.07)
BASOS ABS: 0.1 10*3/uL (ref 0.0–0.1)
Basophils Relative: 1 %
Eosinophils Absolute: 0.1 10*3/uL (ref 0.0–0.5)
Eosinophils Relative: 3 %
HEMATOCRIT: 36.8 % (ref 36.0–46.0)
HEMOGLOBIN: 11.5 g/dL — AB (ref 12.0–15.0)
IMMATURE GRANULOCYTES: 1 %
LYMPHS ABS: 0.8 10*3/uL (ref 0.7–4.0)
LYMPHS PCT: 18 %
MCH: 32.1 pg (ref 26.0–34.0)
MCHC: 31.3 g/dL (ref 30.0–36.0)
MCV: 102.8 fL — ABNORMAL HIGH (ref 80.0–100.0)
Monocytes Absolute: 0.5 10*3/uL (ref 0.1–1.0)
Monocytes Relative: 11 %
NEUTROS ABS: 2.9 10*3/uL (ref 1.7–7.7)
NRBC: 0 % (ref 0.0–0.2)
Neutrophils Relative %: 66 %
Platelets: 223 10*3/uL (ref 150–400)
RBC: 3.58 MIL/uL — AB (ref 3.87–5.11)
RDW: 13.3 % (ref 11.5–15.5)
WBC: 4.3 10*3/uL (ref 4.0–10.5)

## 2018-10-29 MED ORDER — SODIUM CHLORIDE 0.9 % IV BOLUS
500.0000 mL | Freq: Once | INTRAVENOUS | Status: AC
  Administered 2018-10-29: 500 mL via INTRAVENOUS

## 2018-10-29 MED ORDER — ACETAMINOPHEN 325 MG PO TABS
650.0000 mg | ORAL_TABLET | Freq: Once | ORAL | Status: AC
  Administered 2018-10-29: 650 mg via ORAL
  Filled 2018-10-29: qty 2

## 2018-10-29 NOTE — ED Provider Notes (Signed)
[facility] Provider Note   CSN: [id number] Arrival date & time: 10/29/18  4098     History   Chief Complaint Chief Complaint  Patient presents with  . Fall    HPI April Burns is a 84 y.o. female who presents with dizziness and a fall.  Past medical history significant for arthritis, hearing loss, hx of lung mass. She states that she is been having issues with dizziness when she gets up in the morning for the past month or so.  She usually will just get up very slowly and wait till the dizziness passes and then get up.  This morning she waited for the dizziness to pass and then got up and went to take her dog out and when she was standing there she became intensely dizzy all of a sudden with everything spinning and she lost her balance and fell.  She thinks she hit her head on a chair.  She denies loss of consciousness but noticed that her head was bleeding.  Her daughter lives with her and was able to help her get up and she was able to walk afterwards.  The patient reports pain over her forehead, pain over her left hand over the index finger knuckle, and right lateral rib soreness.  She denies severe headache, chest pain, shortness of breath, abdominal pain.  No lower extremity pain.  She is not on blood thinners.  She does take a sleeping pill at night and has been taking this more frequently than usual.  HPI  Past Medical History:  Diagnosis Date  . Allergic rhinitis   . Arthritis    gout  . Cancer ([place])   . Diverticulosis    no current flare up  . Gout   . Hearing loss   . HTN (hypertension)   . Hypercholesterolemia   . Lung mass    R lung  . Osteopenia   . Restless leg syndrome     Patient Active Problem List   Diagnosis Date Noted  . Lung mass 02/19/2012  . Hypercholesterolemia   . Gout   . Allergic rhinitis   . Restless leg syndrome   . HTN (hypertension)   . Hearing loss   . Osteopenia     Past Surgical  History:  Procedure Laterality Date  . ABDOMINAL HYSTERECTOMY    . C section X2    . cataract extractions bilateral  2010/2011  . EYE SURGERY     bilateral  . rt leg vein strippin for varicosities    . stoneburner    . TAH ovaries intact    . VIDEO BRONCHOSCOPY  02/01/2012   Procedure: VIDEO BRONCHOSCOPY;  Surgeon: Grace Isaac, MD;  Location: [facility] OR;  Service: Thoracic;  Laterality: N/A;     OB History   None      Home Medications    Prior to Admission medications   Medication Sig Start Date End Date Taking? Authorizing Provider  acetaminophen (TYLENOL) 325 MG tablet Take 650 mg by mouth every 6 (six) hours as needed. For pain    [provider]  amLODipine-olmesartan (AZOR) 10-40 MG per tablet Take 0.5 tablets by mouth daily.     [provider]  aspirin 81 MG tablet Take 81 mg by mouth every Monday, Wednesday, and Friday.     [provider]  azithromycin (ZITHROMAX) 250 MG tablet  04/18/15   [provider]  AZOR 5-20 MG per tablet  04/18/15  [provider]  calcium-vitamin D (OSCAL WITH D) 500-200 MG-UNIT per tablet Take 1 tablet by mouth 2 (two) times daily.    [provider]  colchicine 0.6 MG tablet Take 0.6 mg by mouth 2 (two) times daily as needed. For gout    [provider]  EPINEPHrine (EPIPEN 2-PAK) 0.3 mg/0.3 mL DEVI Inject 0.3 mg into the muscle once.    [provider]  febuxostat (ULORIC) 40 MG tablet Take 80 mg by mouth daily.    [provider]  fluticasone (FLONASE) 50 MCG/ACT nasal spray Place 2 sprays into the nose daily.    [provider]  furosemide (LASIX) 40 MG tablet Take 40 mg by mouth daily as needed. FOR LEG SWELLING    [provider]  LASIX 20 MG tablet  02/04/15   [provider]  mometasone (ELOCON) 0.1 % cream  02/17/15   [provider]  Tetrahydrozoline HCl (VISINE OP) Place 1 drop into both eyes every 6 (six) hours. For dry  eyes    [provider]    Family History Family History  Problem Relation Age of Onset  . Heart disease Father   . Alcohol abuse Brother     Social History Social History   Tobacco Use  . Smoking status: Former Smoker    Packs/day: 1.00    Years: 50.00    Pack years: 50.00    Types: Cigarettes    Last attempt to quit: 12/31/1993    Years since quitting: 24.8  . Smokeless tobacco: Never Used  Substance Use Topics  . Alcohol use: Yes    Alcohol/week: 10.0 standard drinks    Types: 10 Standard drinks or equivalent per week  . Drug use: No     Allergies   Neosporin [neomycin-polymyxin-gramicidin] and Statins   Review of Systems Review of Systems  Constitutional: Negative for fever.  Respiratory: Negative for shortness of breath.   Cardiovascular: Negative for chest pain.  Gastrointestinal: Negative for abdominal pain.  Genitourinary: Negative for dysuria.  Musculoskeletal: Positive for arthralgias, joint swelling and myalgias. Negative for back pain and neck pain.  Skin: Positive for wound.  Neurological: Positive for dizziness. Negative for syncope and headaches.  All other systems reviewed and are negative.    Physical Exam Updated Vital Signs BP (!) 153/69 (BP Location: Right Arm)   Pulse 72   Temp 97.9 F (36.6 C) (Oral)   Resp 16   Ht 5\' 3"  (1.6 m)   Wt 51.7 kg   SpO2 100%   BMI 20.19 kg/m   Physical Exam  Constitutional: She is oriented to person, place, and time. She appears well-developed and well-nourished. No distress.  Mildly tremulous but cooperative and pleasant  HENT:  Head: Normocephalic.  Large hematoma over the right forehead with small abrasion  Eyes: Pupils are equal, round, and reactive to light. Conjunctivae are normal. Right eye exhibits no discharge. Left eye exhibits no discharge. No scleral icterus.  Neck: Normal range of motion.  Cardiovascular: Normal rate and regular rhythm.  Pulmonary/Chest: Effort normal and  breath sounds normal. No respiratory distress. She exhibits no tenderness.  Abdominal: Soft. Bowel sounds are normal. She exhibits no distension. There is no tenderness.  Musculoskeletal:  Moves upper and lower extremities without difficulty  Left hand: Bruising and swelling over the MCP joint of index finger    Neurological: She is alert and oriented to person, place, and time. She displays tremor (in the bilateral upper extremities which is  inconsistent).  Skin: Skin is warm and dry.  Psychiatric: She has a normal mood and affect. Her behavior is normal.  Nursing note and vitals reviewed.    ED Treatments / Results  Labs (all labs ordered are listed, but only abnormal results are displayed) Labs Reviewed  BASIC METABOLIC PANEL - Abnormal; Notable for the following components:      Result Value   Glucose, Bld 102 (*)    BUN 48 (*)    Creatinine, Ser 1.46 (*)    GFR calc non Af Amer 31 (*)    GFR calc Af Amer 36 (*)    All other components within normal limits  CBC WITH DIFFERENTIAL/PLATELET - Abnormal; Notable for the following components:   RBC 3.58 (*)    Hemoglobin 11.5 (*)    MCV 102.8 (*)    All other components within normal limits  URINALYSIS, ROUTINE W REFLEX MICROSCOPIC - Abnormal; Notable for the following components:   Color, Urine STRAW (*)    Hgb urine dipstick SMALL (*)    All other components within normal limits    EKG EKG Interpretation  Date/Time:  Wednesday October 29 2018 06:36:01 EDT Ventricular Rate:  73 PR Interval:    QRS Duration: 127 QT Interval:  429 QTC Calculation: 473 R Axis:   -52 Text Interpretation:  Sinus rhythm RBBB and LAFB Probable lateral infarct, old Confirmed by Veryl Speak [phone]) on 10/29/2018 6:50:33 AM   Radiology Dg Ribs Unilateral W/chest Right  Result Date: 10/29/2018 CLINICAL DATA:  Status post fall this morning at home. The patient complains of right lateral ribcage pain. History of hypertension, former smoker.  EXAM: RIGHT RIBS AND CHEST - 3+ VIEW COMPARISON:  Chest x-ray of October 19, 2016 FINDINGS: The lungs are well-expanded. The interstitial markings are coarse. There are postsurgical changes at the right lung base. There is no pleural effusion or pneumothorax. The heart and mediastinal structures are normal. There is calcification in the wall of the aortic arch. Right rib detail images include a metallic BB over the symptomatic lower right ribs. Deep to this no acute displaced or nondisplaced rib fracture is observed. IMPRESSION: Chronic bronchitic changes. No alveolar pneumonia, CHF, nor other acute cardiopulmonary abnormality. No acute right rib fracture is observed. Thoracic aortic atherosclerosis. Electronically Signed   By: David  Martinique M.D.   On: 10/29/2018 08:28   Dg Pelvis 1-2 Views  Result Date: 10/29/2018 CLINICAL DATA:  Pain following fall EXAM: PELVIS - 1-2 VIEW COMPARISON:  May 18, 2009 FINDINGS: There is no evidence of pelvic fracture or dislocation. There is mild symmetric narrowing of each hip joint. Bones appear osteoporotic. There is degenerative change in the lower lumbar spine. IMPRESSION: Bones osteoporotic. No fracture or dislocation. Degenerative change in the lower lumbar spine as well as mild narrowing of each hip joint. Electronically Signed   By: Lowella Grip III M.D.   On: 10/29/2018 08:29   Ct Head Wo Contrast  Result Date: 10/29/2018 CLINICAL DATA:  Pain following fall.  Dizziness. EXAM: CT HEAD WITHOUT CONTRAST CT CERVICAL SPINE WITHOUT CONTRAST TECHNIQUE: Multidetector CT imaging of the head and cervical spine was performed following the standard protocol without intravenous contrast. Multiplanar CT image reconstructions of the cervical spine were also generated. COMPARISON:  Head CT April 04, 2008 FINDINGS: CT HEAD FINDINGS Brain: There is age related volume loss. There is no intracranial mass, hemorrhage, extra-axial fluid collection, or midline shift. There is patchy  small vessel disease in the centra semiovale bilaterally.  No acute infarct evident. Vascular: No hyperdense vessel. There is calcification in each carotid siphon region. Skull: Bony calvarium appears intact. There is a right frontal scalp hematoma. Sinuses/Orbits: There is opacification of the maxillary antra bilaterally, more severe on the left than on the right. There is mucosal thickening in multiple ethmoid air cells. There is diffuse frontal sinus opacification bilaterally. Orbits appear symmetric bilaterally. Other: Mastoid air cells are clear. There is degenerative change in the left temporomandibular joint. CT CERVICAL SPINE FINDINGS Alignment: There is no evidence spondylolisthesis. Skull base and vertebrae: Bones are osteoporotic. Skull base and craniocervical junction regions appear normal. There is no evident acute fracture. There are no blastic or lytic bone lesions. Soft tissues and spinal canal: Prevertebral soft tissues and predental space regions are normal. There is no evident paraspinous lesion. There is no appreciable cord or canal hematoma. Disc levels: There is moderate disc space narrowing at C3-4, C4-5, and C6-7. There is more severe disc space narrowing at C5-6. Foci of vacuum phenomenon are noted at C3-4, C4-5, C5-6, and C6-7. There is facet hypertrophy at multiple levels. There is exit foraminal narrowing due to bony hypertrophy at C3-4, C4-5, and C5-6 bilaterally with impression on the exiting nerve roots at these levels due to bony hypertrophy. There is no frank disc extrusion or high-grade stenosis. Upper chest: Visualized upper lung zones are clear. Other: There are foci of calcification in each carotid and subclavian artery. IMPRESSION: CT head: Age related volume loss with mild periventricular small vessel disease. No intracranial mass or hemorrhage. No acute infarct. There are foci of arterial vascular calcification. There is multifocal paranasal sinus disease. There is a right  frontal scalp hematoma.  No fracture evident. CT cervical spine: Bones osteoporotic. No acute fracture or spondylolisthesis. There is multilevel arthropathy. There are foci of subclavian and carotid artery calcification bilaterally. Electronically Signed   By: Lowella Grip III M.D.   On: 10/29/2018 07:48   Ct Cervical Spine Wo Contrast  Result Date: 10/29/2018 CLINICAL DATA:  Pain following fall.  Dizziness. EXAM: CT HEAD WITHOUT CONTRAST CT CERVICAL SPINE WITHOUT CONTRAST TECHNIQUE: Multidetector CT imaging of the head and cervical spine was performed following the standard protocol without intravenous contrast. Multiplanar CT image reconstructions of the cervical spine were also generated. COMPARISON:  Head CT April 04, 2008 FINDINGS: CT HEAD FINDINGS Brain: There is age related volume loss. There is no intracranial mass, hemorrhage, extra-axial fluid collection, or midline shift. There is patchy small vessel disease in the centra semiovale bilaterally. No acute infarct evident. Vascular: No hyperdense vessel. There is calcification in each carotid siphon region. Skull: Bony calvarium appears intact. There is a right frontal scalp hematoma. Sinuses/Orbits: There is opacification of the maxillary antra bilaterally, more severe on the left than on the right. There is mucosal thickening in multiple ethmoid air cells. There is diffuse frontal sinus opacification bilaterally. Orbits appear symmetric bilaterally. Other: Mastoid air cells are clear. There is degenerative change in the left temporomandibular joint. CT CERVICAL SPINE FINDINGS Alignment: There is no evidence spondylolisthesis. Skull base and vertebrae: Bones are osteoporotic. Skull base and craniocervical junction regions appear normal. There is no evident acute fracture. There are no blastic or lytic bone lesions. Soft tissues and spinal canal: Prevertebral soft tissues and predental space regions are normal. There is no evident paraspinous  lesion. There is no appreciable cord or canal hematoma. Disc levels: There is moderate disc space narrowing at C3-4, C4-5, and C6-7. There is more severe disc space  narrowing at C5-6. Foci of vacuum phenomenon are noted at C3-4, C4-5, C5-6, and C6-7. There is facet hypertrophy at multiple levels. There is exit foraminal narrowing due to bony hypertrophy at C3-4, C4-5, and C5-6 bilaterally with impression on the exiting nerve roots at these levels due to bony hypertrophy. There is no frank disc extrusion or high-grade stenosis. Upper chest: Visualized upper lung zones are clear. Other: There are foci of calcification in each carotid and subclavian artery. IMPRESSION: CT head: Age related volume loss with mild periventricular small vessel disease. No intracranial mass or hemorrhage. No acute infarct. There are foci of arterial vascular calcification. There is multifocal paranasal sinus disease. There is a right frontal scalp hematoma.  No fracture evident. CT cervical spine: Bones osteoporotic. No acute fracture or spondylolisthesis. There is multilevel arthropathy. There are foci of subclavian and carotid artery calcification bilaterally. Electronically Signed   By: Lowella Grip III M.D.   On: 10/29/2018 07:48   Dg Hand Complete Left  Result Date: 10/29/2018 CLINICAL DATA:  Left hand injury.  Fall EXAM: LEFT HAND - COMPLETE 3+ VIEW COMPARISON:  None. FINDINGS: Negative for fracture. Moderate degenerative change in the second DIP joint and base of thumb. No erosions. IMPRESSION: Negative for fracture Electronically Signed   By: Franchot Gallo M.D.   On: 10/29/2018 08:28    Procedures Procedures (including critical care time)  Medications Ordered in ED Medications  acetaminophen (TYLENOL) tablet 650 mg (650 mg Oral Given 10/29/18 0913)  sodium chloride 0.9 % bolus 500 mL (0 mLs Intravenous Stopped 10/29/18 0958)     Initial Impression / Assessment and Plan / ED Course  I have reviewed the triage  vital signs and the nursing notes.  Pertinent labs & imaging results that were available during my care of the patient were reviewed by me and considered in my medical decision making (see chart for details).  84 year old female with a fall after having an intense episode of dizziness. She is mildly hypertensive but otherwise vitals are normal. She has an obvious head injury with small abrasion which will not require repair. She has a small amount of bruising of her left hand and right rib soreness. Will obtain EKG, labs, UA, and imaging.  CT head and C-spine are negative. Apparently the patient has been taking Lasix for weight loss and drinking a shot of vodka before bedtime and and using sleep aids. This may be contributing to her dizzy spells/orthostasis. Labs show mild anemia and elevated SCr 1.4 although I do not have a recent value to compare. Will obtain orthostatics. EKG is SR.  Orthostatic VS for the past 24 hrs:  BP- Lying Pulse- Lying BP- Sitting Pulse- Sitting BP- Standing at 0 minutes Pulse- Standing at 0 minutes  10/29/18 0848 107/83 69 143/64 64 130/71 72   Orthostatics are negative but she feels a little dizzy with standing. Will give 500cc bolus. Discussed all results with the patient and her daughter. Recommended compression stockings for leg swelling, using Tylenol PM or Melatonin instead of vodka and Ambien. She has a f/u appt with her doctor in November. Return precautions given.   Final Clinical Impressions(s) / ED Diagnoses   Final diagnoses:  Traumatic hematoma of forehead, initial encounter  Left hand pain  Rib contusion, right, initial encounter  AKI (acute kidney injury) [facility])    ED Discharge Orders    None       Recardo Evangelist, PA-C 10/29/18 1029    Carmin Muskrat,  MD 10/30/18 [id number]

## 2018-10-29 NOTE — Discharge Instructions (Signed)
Please avoid alcohol and regular use of Ambien to reduce dizziness. Try Tylenol PM or Melatonin for sleep instead. Avoid using Lasix for leg swelling because this can dehydrate you. Use compression stockings instead  Apply ice to your forehead for the first 24 hours and then switch to heat Please follow up with your doctor Return if you are worsening

## 2018-10-29 NOTE — ED Notes (Signed)
Patient transported to X-ray 

## 2018-10-29 NOTE — ED Triage Notes (Addendum)
Patient arrived with EMS from home felt dizzy this morning and fell / hit her head against a chair denies LOC , alert and oriented , presents with hematoma at right upper forehead / laceration , pt. added right lateral ribcage pain and left knuckle pain . C- collar applied by EMS , CBG= 103 .

## 2018-11-06 DIAGNOSIS — J441 Chronic obstructive pulmonary disease with (acute) exacerbation: Secondary | ICD-10-CM | POA: Diagnosis not present

## 2018-11-06 DIAGNOSIS — E785 Hyperlipidemia, unspecified: Secondary | ICD-10-CM | POA: Diagnosis not present

## 2018-11-06 DIAGNOSIS — M810 Age-related osteoporosis without current pathological fracture: Secondary | ICD-10-CM | POA: Diagnosis not present

## 2018-11-06 DIAGNOSIS — I10 Essential (primary) hypertension: Secondary | ICD-10-CM | POA: Diagnosis not present

## 2018-11-06 DIAGNOSIS — M169 Osteoarthritis of hip, unspecified: Secondary | ICD-10-CM | POA: Diagnosis not present

## 2018-11-06 DIAGNOSIS — N183 Chronic kidney disease, stage 3 (moderate): Secondary | ICD-10-CM | POA: Diagnosis not present

## 2018-11-06 DIAGNOSIS — E782 Mixed hyperlipidemia: Secondary | ICD-10-CM | POA: Diagnosis not present

## 2018-11-06 DIAGNOSIS — E7800 Pure hypercholesterolemia, unspecified: Secondary | ICD-10-CM | POA: Diagnosis not present

## 2018-11-07 DIAGNOSIS — Z85820 Personal history of malignant melanoma of skin: Secondary | ICD-10-CM | POA: Diagnosis not present

## 2018-11-07 DIAGNOSIS — L814 Other melanin hyperpigmentation: Secondary | ICD-10-CM | POA: Diagnosis not present

## 2018-11-07 DIAGNOSIS — Z85828 Personal history of other malignant neoplasm of skin: Secondary | ICD-10-CM | POA: Diagnosis not present

## 2018-11-07 DIAGNOSIS — D225 Melanocytic nevi of trunk: Secondary | ICD-10-CM | POA: Diagnosis not present

## 2018-11-07 DIAGNOSIS — L821 Other seborrheic keratosis: Secondary | ICD-10-CM | POA: Diagnosis not present

## 2018-11-07 DIAGNOSIS — D692 Other nonthrombocytopenic purpura: Secondary | ICD-10-CM | POA: Diagnosis not present

## 2018-11-19 DIAGNOSIS — S3992XA Unspecified injury of lower back, initial encounter: Secondary | ICD-10-CM | POA: Diagnosis not present

## 2018-11-19 DIAGNOSIS — I10 Essential (primary) hypertension: Secondary | ICD-10-CM | POA: Diagnosis not present

## 2018-11-19 DIAGNOSIS — G2581 Restless legs syndrome: Secondary | ICD-10-CM | POA: Diagnosis not present

## 2018-11-19 DIAGNOSIS — Z7289 Other problems related to lifestyle: Secondary | ICD-10-CM | POA: Diagnosis not present

## 2018-11-19 DIAGNOSIS — G4700 Insomnia, unspecified: Secondary | ICD-10-CM | POA: Diagnosis not present

## 2018-11-19 DIAGNOSIS — Z23 Encounter for immunization: Secondary | ICD-10-CM | POA: Diagnosis not present

## 2018-11-19 DIAGNOSIS — R55 Syncope and collapse: Secondary | ICD-10-CM | POA: Diagnosis not present

## 2018-11-20 ENCOUNTER — Other Ambulatory Visit: Payer: Self-pay | Admitting: Internal Medicine

## 2018-11-20 ENCOUNTER — Ambulatory Visit
Admission: RE | Admit: 2018-11-20 | Discharge: 2018-11-20 | Disposition: A | Discharge status: Home / Self Care | Payer: Medicare Other | Source: Ambulatory Visit | Attending: Internal Medicine | Admitting: Internal Medicine

## 2018-11-20 DIAGNOSIS — M549 Dorsalgia, unspecified: Secondary | ICD-10-CM

## 2018-11-20 DIAGNOSIS — S3992XA Unspecified injury of lower back, initial encounter: Principal | ICD-10-CM

## 2018-11-20 DIAGNOSIS — M546 Pain in thoracic spine: Secondary | ICD-10-CM | POA: Diagnosis not present

## 2018-11-20 DIAGNOSIS — S299XXA Unspecified injury of thorax, initial encounter: Secondary | ICD-10-CM | POA: Diagnosis not present

## 2018-12-17 DIAGNOSIS — I10 Essential (primary) hypertension: Secondary | ICD-10-CM | POA: Diagnosis not present

## 2018-12-17 DIAGNOSIS — S3992XA Unspecified injury of lower back, initial encounter: Secondary | ICD-10-CM | POA: Diagnosis not present

## 2018-12-17 DIAGNOSIS — G2581 Restless legs syndrome: Secondary | ICD-10-CM | POA: Diagnosis not present

## 2018-12-17 DIAGNOSIS — G4700 Insomnia, unspecified: Secondary | ICD-10-CM | POA: Diagnosis not present

## 2018-12-17 DIAGNOSIS — Z7289 Other problems related to lifestyle: Secondary | ICD-10-CM | POA: Diagnosis not present

## 2018-12-17 DIAGNOSIS — R55 Syncope and collapse: Secondary | ICD-10-CM | POA: Diagnosis not present

## 2019-02-18 DIAGNOSIS — Z7289 Other problems related to lifestyle: Secondary | ICD-10-CM | POA: Diagnosis not present

## 2019-02-18 DIAGNOSIS — M5431 Sciatica, right side: Secondary | ICD-10-CM | POA: Diagnosis not present

## 2019-02-18 DIAGNOSIS — S3992XA Unspecified injury of lower back, initial encounter: Secondary | ICD-10-CM | POA: Diagnosis not present

## 2019-02-18 DIAGNOSIS — I10 Essential (primary) hypertension: Secondary | ICD-10-CM | POA: Diagnosis not present

## 2019-02-18 DIAGNOSIS — R55 Syncope and collapse: Secondary | ICD-10-CM | POA: Diagnosis not present

## 2019-02-18 DIAGNOSIS — J309 Allergic rhinitis, unspecified: Secondary | ICD-10-CM | POA: Diagnosis not present

## 2019-05-07 DIAGNOSIS — D225 Melanocytic nevi of trunk: Secondary | ICD-10-CM | POA: Diagnosis not present

## 2019-05-07 DIAGNOSIS — D1801 Hemangioma of skin and subcutaneous tissue: Secondary | ICD-10-CM | POA: Diagnosis not present

## 2019-05-07 DIAGNOSIS — L821 Other seborrheic keratosis: Secondary | ICD-10-CM | POA: Diagnosis not present

## 2019-05-07 DIAGNOSIS — D2271 Melanocytic nevi of right lower limb, including hip: Secondary | ICD-10-CM | POA: Diagnosis not present

## 2019-05-07 DIAGNOSIS — D692 Other nonthrombocytopenic purpura: Secondary | ICD-10-CM | POA: Diagnosis not present

## 2019-05-07 DIAGNOSIS — D2272 Melanocytic nevi of left lower limb, including hip: Secondary | ICD-10-CM | POA: Diagnosis not present

## 2019-05-07 DIAGNOSIS — Z85828 Personal history of other malignant neoplasm of skin: Secondary | ICD-10-CM | POA: Diagnosis not present

## 2019-05-07 DIAGNOSIS — L814 Other melanin hyperpigmentation: Secondary | ICD-10-CM | POA: Diagnosis not present

## 2019-05-07 DIAGNOSIS — Z85820 Personal history of malignant melanoma of skin: Secondary | ICD-10-CM | POA: Diagnosis not present

## 2019-06-03 DIAGNOSIS — J441 Chronic obstructive pulmonary disease with (acute) exacerbation: Secondary | ICD-10-CM | POA: Diagnosis not present

## 2019-06-03 DIAGNOSIS — I10 Essential (primary) hypertension: Secondary | ICD-10-CM | POA: Diagnosis not present

## 2019-06-03 DIAGNOSIS — M109 Gout, unspecified: Secondary | ICD-10-CM | POA: Diagnosis not present

## 2019-06-03 DIAGNOSIS — J309 Allergic rhinitis, unspecified: Secondary | ICD-10-CM | POA: Diagnosis not present

## 2019-06-03 DIAGNOSIS — M5431 Sciatica, right side: Secondary | ICD-10-CM | POA: Diagnosis not present

## 2019-06-03 DIAGNOSIS — Z1389 Encounter for screening for other disorder: Secondary | ICD-10-CM | POA: Diagnosis not present

## 2019-06-03 DIAGNOSIS — Z0001 Encounter for general adult medical examination with abnormal findings: Secondary | ICD-10-CM | POA: Diagnosis not present

## 2019-06-03 DIAGNOSIS — E559 Vitamin D deficiency, unspecified: Secondary | ICD-10-CM | POA: Diagnosis not present

## 2019-06-03 DIAGNOSIS — G259 Extrapyramidal and movement disorder, unspecified: Secondary | ICD-10-CM | POA: Diagnosis not present

## 2019-06-03 DIAGNOSIS — E782 Mixed hyperlipidemia: Secondary | ICD-10-CM | POA: Diagnosis not present

## 2019-06-03 DIAGNOSIS — G2581 Restless legs syndrome: Secondary | ICD-10-CM | POA: Diagnosis not present

## 2019-09-26 DIAGNOSIS — Z23 Encounter for immunization: Secondary | ICD-10-CM | POA: Diagnosis not present

## 2019-10-19 DIAGNOSIS — H52203 Unspecified astigmatism, bilateral: Secondary | ICD-10-CM | POA: Diagnosis not present

## 2019-10-19 DIAGNOSIS — H353131 Nonexudative age-related macular degeneration, bilateral, early dry stage: Secondary | ICD-10-CM | POA: Diagnosis not present

## 2019-10-19 DIAGNOSIS — Z961 Presence of intraocular lens: Secondary | ICD-10-CM | POA: Diagnosis not present

## 2019-10-19 DIAGNOSIS — H43813 Vitreous degeneration, bilateral: Secondary | ICD-10-CM | POA: Diagnosis not present

## 2019-11-15 ENCOUNTER — Inpatient Hospital Stay (HOSPITAL_COMMUNITY)
Admission: EM | Admit: 2019-11-15 | Discharge: 2019-11-19 | DRG: 378 | Disposition: A | Discharge status: Home / Self Care | Payer: Medicare Other | Attending: Internal Medicine | Admitting: Internal Medicine

## 2019-11-15 ENCOUNTER — Other Ambulatory Visit: Payer: Self-pay

## 2019-11-15 ENCOUNTER — Encounter (HOSPITAL_COMMUNITY): Payer: Self-pay

## 2019-11-15 ENCOUNTER — Observation Stay (HOSPITAL_COMMUNITY): Payer: Medicare Other

## 2019-11-15 ENCOUNTER — Emergency Department (HOSPITAL_COMMUNITY): Payer: Medicare Other

## 2019-11-15 DIAGNOSIS — M8580 Other specified disorders of bone density and structure, unspecified site: Secondary | ICD-10-CM | POA: Diagnosis present

## 2019-11-15 DIAGNOSIS — G2581 Restless legs syndrome: Secondary | ICD-10-CM | POA: Diagnosis present

## 2019-11-15 DIAGNOSIS — K922 Gastrointestinal hemorrhage, unspecified: Secondary | ICD-10-CM | POA: Diagnosis present

## 2019-11-15 DIAGNOSIS — K921 Melena: Secondary | ICD-10-CM | POA: Diagnosis not present

## 2019-11-15 DIAGNOSIS — K559 Vascular disorder of intestine, unspecified: Secondary | ICD-10-CM | POA: Diagnosis present

## 2019-11-15 DIAGNOSIS — E876 Hypokalemia: Secondary | ICD-10-CM | POA: Diagnosis present

## 2019-11-15 DIAGNOSIS — Z20828 Contact with and (suspected) exposure to other viral communicable diseases: Secondary | ICD-10-CM | POA: Diagnosis not present

## 2019-11-15 DIAGNOSIS — R110 Nausea: Secondary | ICD-10-CM | POA: Diagnosis not present

## 2019-11-15 DIAGNOSIS — Z90710 Acquired absence of both cervix and uterus: Secondary | ICD-10-CM

## 2019-11-15 DIAGNOSIS — Z87891 Personal history of nicotine dependence: Secondary | ICD-10-CM

## 2019-11-15 DIAGNOSIS — D62 Acute posthemorrhagic anemia: Secondary | ICD-10-CM | POA: Diagnosis not present

## 2019-11-15 DIAGNOSIS — R1111 Vomiting without nausea: Secondary | ICD-10-CM | POA: Diagnosis not present

## 2019-11-15 DIAGNOSIS — I10 Essential (primary) hypertension: Secondary | ICD-10-CM | POA: Diagnosis present

## 2019-11-15 DIAGNOSIS — K5731 Diverticulosis of large intestine without perforation or abscess with bleeding: Principal | ICD-10-CM | POA: Diagnosis present

## 2019-11-15 DIAGNOSIS — N179 Acute kidney failure, unspecified: Secondary | ICD-10-CM | POA: Diagnosis not present

## 2019-11-15 DIAGNOSIS — K5730 Diverticulosis of large intestine without perforation or abscess without bleeding: Secondary | ICD-10-CM | POA: Diagnosis not present

## 2019-11-15 DIAGNOSIS — N183 Chronic kidney disease, stage 3 unspecified: Secondary | ICD-10-CM | POA: Diagnosis present

## 2019-11-15 DIAGNOSIS — I129 Hypertensive chronic kidney disease with stage 1 through stage 4 chronic kidney disease, or unspecified chronic kidney disease: Secondary | ICD-10-CM | POA: Diagnosis present

## 2019-11-15 DIAGNOSIS — N189 Chronic kidney disease, unspecified: Secondary | ICD-10-CM | POA: Diagnosis present

## 2019-11-15 DIAGNOSIS — Z7982 Long term (current) use of aspirin: Secondary | ICD-10-CM

## 2019-11-15 DIAGNOSIS — E785 Hyperlipidemia, unspecified: Secondary | ICD-10-CM | POA: Diagnosis present

## 2019-11-15 DIAGNOSIS — M810 Age-related osteoporosis without current pathological fracture: Secondary | ICD-10-CM | POA: Diagnosis present

## 2019-11-15 DIAGNOSIS — R1084 Generalized abdominal pain: Secondary | ICD-10-CM | POA: Diagnosis not present

## 2019-11-15 DIAGNOSIS — E7800 Pure hypercholesterolemia, unspecified: Secondary | ICD-10-CM | POA: Diagnosis present

## 2019-11-15 DIAGNOSIS — R52 Pain, unspecified: Secondary | ICD-10-CM | POA: Diagnosis not present

## 2019-11-15 DIAGNOSIS — Z79899 Other long term (current) drug therapy: Secondary | ICD-10-CM

## 2019-11-15 DIAGNOSIS — H9190 Unspecified hearing loss, unspecified ear: Secondary | ICD-10-CM | POA: Diagnosis present

## 2019-11-15 HISTORY — DX: Solitary pulmonary nodule: R91.1

## 2019-11-15 LAB — CBC WITH DIFFERENTIAL/PLATELET
Abs Immature Granulocytes: 0.05 10*3/uL (ref 0.00–0.07)
Basophils Absolute: 0 10*3/uL (ref 0.0–0.1)
Basophils Relative: 0 %
Eosinophils Absolute: 0.1 10*3/uL (ref 0.0–0.5)
Eosinophils Relative: 1 %
HCT: 33.5 % — ABNORMAL LOW (ref 36.0–46.0)
Hemoglobin: 10.8 g/dL — ABNORMAL LOW (ref 12.0–15.0)
Immature Granulocytes: 1 %
Lymphocytes Relative: 8 %
Lymphs Abs: 0.8 10*3/uL (ref 0.7–4.0)
MCH: 32.3 pg (ref 26.0–34.0)
MCHC: 32.2 g/dL (ref 30.0–36.0)
MCV: 100.3 fL — ABNORMAL HIGH (ref 80.0–100.0)
Monocytes Absolute: 1 10*3/uL (ref 0.1–1.0)
Monocytes Relative: 10 %
Neutro Abs: 8 10*3/uL — ABNORMAL HIGH (ref 1.7–7.7)
Neutrophils Relative %: 80 %
Platelets: 322 10*3/uL (ref 150–400)
RBC: 3.34 MIL/uL — ABNORMAL LOW (ref 3.87–5.11)
RDW: 12.9 % (ref 11.5–15.5)
WBC: 10 10*3/uL (ref 4.0–10.5)
nRBC: 0 % (ref 0.0–0.2)

## 2019-11-15 LAB — COMPREHENSIVE METABOLIC PANEL
ALT: 12 U/L (ref 0–44)
AST: 16 U/L (ref 15–41)
Albumin: 3.3 g/dL — ABNORMAL LOW (ref 3.5–5.0)
Alkaline Phosphatase: 98 U/L (ref 38–126)
Anion gap: 16 — ABNORMAL HIGH (ref 5–15)
BUN: 53 mg/dL — ABNORMAL HIGH (ref 8–23)
CO2: 21 mmol/L — ABNORMAL LOW (ref 22–32)
Calcium: 9 mg/dL (ref 8.9–10.3)
Chloride: 99 mmol/L (ref 98–111)
Creatinine, Ser: 1.83 mg/dL — ABNORMAL HIGH (ref 0.44–1.00)
GFR calc Af Amer: 28 mL/min — ABNORMAL LOW (ref >60)
GFR calc non Af Amer: 24 mL/min — ABNORMAL LOW (ref >60)
Glucose, Bld: 126 mg/dL — ABNORMAL HIGH (ref 70–99)
Potassium: 3.4 mmol/L — ABNORMAL LOW (ref 3.5–5.1)
Sodium: 136 mmol/L (ref 135–145)
Total Bilirubin: 1.2 mg/dL (ref 0.3–1.2)
Total Protein: 6.8 g/dL (ref 6.5–8.1)

## 2019-11-15 LAB — HEMOGLOBIN AND HEMATOCRIT, BLOOD
HCT: 39 % (ref 36.0–46.0)
HCT: 30.1 % — ABNORMAL LOW (ref 36.0–46.0)
Hemoglobin: 12.8 g/dL (ref 12.0–15.0)
Hemoglobin: 9.7 g/dL — ABNORMAL LOW (ref 12.0–15.0)

## 2019-11-15 LAB — VITAMIN B12: Vitamin B-12: 244 pg/mL (ref 180–914)

## 2019-11-15 LAB — PROTIME-INR
INR: 1.1 (ref 0.8–1.2)
Prothrombin Time: 14 seconds (ref 11.4–15.2)

## 2019-11-15 LAB — SARS CORONAVIRUS 2 (TAT 6-24 HRS): SARS Coronavirus 2: NEGATIVE

## 2019-11-15 LAB — APTT: aPTT: 28 seconds (ref 24–36)

## 2019-11-15 MED ORDER — SODIUM CHLORIDE 0.9 % IV SOLN
8.0000 mg/h | INTRAVENOUS | Status: DC
  Administered 2019-11-15 (×2): 8 mg/h via INTRAVENOUS
  Filled 2019-11-15 (×4): qty 80

## 2019-11-15 MED ORDER — SODIUM CHLORIDE 0.9 % IV SOLN
INTRAVENOUS | Status: DC
  Administered 2019-11-15: 09:00:00 via INTRAVENOUS

## 2019-11-15 MED ORDER — SODIUM CHLORIDE 0.9% FLUSH
3.0000 mL | Freq: Two times a day (BID) | INTRAVENOUS | Status: DC
  Administered 2019-11-16 – 2019-11-18 (×6): 3 mL via INTRAVENOUS

## 2019-11-15 MED ORDER — PANTOPRAZOLE SODIUM 40 MG IV SOLR
40.0000 mg | Freq: Once | INTRAVENOUS | Status: AC
  Administered 2019-11-15: 40 mg via INTRAVENOUS
  Filled 2019-11-15: qty 40

## 2019-11-15 MED ORDER — ALLOPURINOL 100 MG PO TABS
100.0000 mg | ORAL_TABLET | Freq: Every day | ORAL | Status: DC
  Administered 2019-11-16 – 2019-11-19 (×4): 100 mg via ORAL
  Filled 2019-11-15 (×5): qty 1

## 2019-11-15 MED ORDER — ZOLPIDEM TARTRATE 5 MG PO TABS
2.5000 mg | ORAL_TABLET | Freq: Every day | ORAL | Status: DC
  Administered 2019-11-15 – 2019-11-18 (×4): 2.5 mg via ORAL
  Filled 2019-11-15 (×4): qty 1

## 2019-11-15 MED ORDER — PANTOPRAZOLE SODIUM 40 MG IV SOLR: 40.0000 mg | Freq: Two times a day (BID) | INTRAVENOUS | Status: DC

## 2019-11-15 MED ORDER — FLUTICASONE PROPIONATE 50 MCG/ACT NA SUSP: 2.0000 | NASAL | Status: DC | PRN

## 2019-11-15 MED ORDER — SODIUM CHLORIDE 0.9 % IV BOLUS
1000.0000 mL | Freq: Once | INTRAVENOUS | Status: AC
  Administered 2019-11-15: 1000 mL via INTRAVENOUS

## 2019-11-15 MED ORDER — POTASSIUM CHLORIDE 10 MEQ/100ML IV SOLN
10.0000 meq | INTRAVENOUS | Status: AC
  Administered 2019-11-15 (×2): 10 meq via INTRAVENOUS
  Filled 2019-11-15 (×2): qty 100

## 2019-11-15 NOTE — ED Notes (Signed)
ED TO INPATIENT HANDOFF REPORT  ED Nurse Name and Phone #:   S Name/Age/Gender April Burns 85 y.o. female Room/Bed: 040C/040C  Code Status   Code Status: Full Code  Home/SNF/Other Home Patient oriented to: self, place, time and situation Is this baseline? yes  Triage Complete: Triage complete  Chief Complaint rectal bleed   Triage Note Pt arrived via [facility]  Pt reports blood in stool since Tuesday. Pt states "the blood came gushing out" and that it was maroon in color. Pt denies N/HA. A&ox4    Allergies Allergies  Allergen Reactions  . Neosporin [Neomycin-Polymyxin-Gramicidin] Dermatitis  . Statins Other (See Comments)    Myalgias and unsteady gait    Level of Care/Admitting Diagnosis ED Disposition    ED Disposition Condition Comment   Admit  Hospital Area: [facility] [[id number]]  Level of Care: Telemetry Medical [104]  I expect the patient will be discharged within 24 hours: No (not a candidate for 5C-Observation unit)  Covid Evaluation: Asymptomatic Screening Protocol (No Symptoms)  Diagnosis: Upper GI bleed [671245]  Admitting Physician: Norval Morton [[id number]]  Attending Physician: Norval Morton [[id number]]  PT Class (Do Not Modify): Observation [104]  PT Acc Code (Do Not Modify): Observation [10022]       B Medical/Surgery History Past Medical History:  Diagnosis Date  . Allergic rhinitis   . Arthritis    gout  . Diverticulosis    no current flare up  . Gout   . Hearing loss   . HTN (hypertension)   . Hypercholesterolemia   . Lung nodule    Status post bronchoscopy reported to be noncancerous  . Osteopenia   . Restless leg syndrome    Past Surgical History:  Procedure Laterality Date  . ABDOMINAL HYSTERECTOMY    . C section X2    . cataract extractions bilateral  2010/2011  . EYE SURGERY     bilateral  . rt leg vein strippin for varicosities    . stoneburner    . TAH ovaries intact    . VIDEO BRONCHOSCOPY   02/01/2012   Procedure: VIDEO BRONCHOSCOPY;  Surgeon: Grace Isaac, MD;  Location: [facility] OR;  Service: Thoracic;  Laterality: N/A;     A IV Location/Drains/Wounds Patient Lines/Drains/Airways Status   Active Line/Drains/Airways    Name:   Placement date:   Placement time:   Site:   Days:   Peripheral IV 10/29/18 Left Hand   10/29/18    -    Hand   382   Peripheral IV 11/15/19 Right Antecubital   11/15/19    0441    Antecubital   less than 1   Incision 02/19/12 Chest Right   02/19/12    1046     2826          Intake/Output Last 24 hours  Intake/Output Summary (Last 24 hours) at 11/15/2019 1255 Last data filed at 11/15/2019 1041 Gross per 24 hour  Intake 1100 ml  Output -  Net 1100 ml    Labs/Imaging Results for orders placed or performed during the hospital encounter of 11/15/19 (from the past 48 hour(s))  CBC with Differential     Status: Abnormal   Collection Time: 11/15/19  4:44 AM  Result Value Ref Range   WBC 10.0 4.0 - 10.5 K/uL   RBC 3.34 (L) 3.87 - 5.11 MIL/uL   Hemoglobin 10.8 (L) 12.0 - 15.0 g/dL   HCT 33.5 (L) 36.0 - 46.0 %  MCV 100.3 (H) 80.0 - 100.0 fL   MCH 32.3 26.0 - 34.0 pg   MCHC 32.2 30.0 - 36.0 g/dL   RDW 12.9 11.5 - 15.5 %   Platelets 322 150 - 400 K/uL   nRBC 0.0 0.0 - 0.2 %   Neutrophils Relative % 80 %   Neutro Abs 8.0 (H) 1.7 - 7.7 K/uL   Lymphocytes Relative 8 %   Lymphs Abs 0.8 0.7 - 4.0 K/uL   Monocytes Relative 10 %   Monocytes Absolute 1.0 0.1 - 1.0 K/uL   Eosinophils Relative 1 %   Eosinophils Absolute 0.1 0.0 - 0.5 K/uL   Basophils Relative 0 %   Basophils Absolute 0.0 0.0 - 0.1 K/uL   Immature Granulocytes 1 %   Abs Immature Granulocytes 0.05 0.00 - 0.07 K/uL    Comment: Performed at [facility] [address], [place], [place] [address]  Comprehensive metabolic panel     Status: Abnormal   Collection Time: 11/15/19  4:44 AM  Result Value Ref Range   Sodium 136 135 - 145 mmol/L   Potassium 3.4 (L) 3.5 - 5.1 mmol/L    Chloride 99 98 - 111 mmol/L   CO2 21 (L) 22 - 32 mmol/L   Glucose, Bld 126 (H) 70 - 99 mg/dL   BUN 53 (H) 8 - 23 mg/dL   Creatinine, Ser 1.83 (H) 0.44 - 1.00 mg/dL   Calcium 9.0 8.9 - 10.3 mg/dL   Total Protein 6.8 6.5 - 8.1 g/dL   Albumin 3.3 (L) 3.5 - 5.0 g/dL   AST 16 15 - 41 U/L   ALT 12 0 - 44 U/L   Alkaline Phosphatase 98 38 - 126 U/L   Total Bilirubin 1.2 0.3 - 1.2 mg/dL   GFR calc non Af Amer 24 (L) >60 mL/min   GFR calc Af Amer 28 (L) >60 mL/min   Anion gap 16 (H) 5 - 15    Comment: Performed at [facility], [place] [address], [place], [place] [address]  Type and screen     Status: None (Preliminary result)   Collection Time: 11/15/19  5:10 AM  Result Value Ref Range   ABO/RH(D) O POS    Antibody Screen POS    Sample Expiration 11/18/2019,2359    Antibody Identification ANTI M    PT AG Type NEGATIVE FOR M ANTIGEN    Unit Number [id number]    Blood Component Type RED CELLS,LR    Unit division 00    Status of Unit ALLOCATED    Transfusion Status OK TO TRANSFUSE    Crossmatch Result COMPATIBLE    Unit Number [id number]    Blood Component Type RED CELLS,LR    Unit division 00    Status of Unit ALLOCATED    Transfusion Status OK TO TRANSFUSE    Crossmatch Result COMPATIBLE   SARS CORONAVIRUS 2 (TAT 6-24 HRS) Nasopharyngeal Nasopharyngeal Swab     Status: None   Collection Time: 11/15/19  6:18 AM   Specimen: Nasopharyngeal Swab  Result Value Ref Range   SARS Coronavirus 2 NEGATIVE NEGATIVE    Comment: (NOTE) SARS-CoV-2 target nucleic acids are NOT DETECTED. The SARS-CoV-2 RNA is generally detectable in upper and lower respiratory specimens during the acute phase of infection. Negative results do not preclude SARS-CoV-2 infection, do not rule out co-infections with other pathogens, and should not be used as the sole basis for treatment or other patient management decisions. Negative results must be combined  with clinical observations, patient history, and  epidemiological information. The expected result is Negative. Fact Sheet for Patients: SugarRoll.be Fact Sheet for Healthcare Providers: https://www.woods-mathews.com/ This test is not yet approved or cleared by the [place] FDA and  has been authorized for detection and/or diagnosis of SARS-CoV-2 by FDA under an Emergency Use Authorization (EUA). This EUA will remain  in effect (meaning this test can be used) for the duration of the COVID-19 declaration under Section 56 4(b)(1) of the Act, 21 U.S.C. section 360bbb-3(b)(1), unless the authorization is terminated or revoked sooner. Performed at [facility], [place] [address], [place], [place] [address]   Protime-INR     Status: None   Collection Time: 11/15/19  8:52 AM  Result Value Ref Range   Prothrombin Time 14.0 11.4 - 15.2 seconds   INR 1.1 0.8 - 1.2    Comment: (NOTE) INR goal varies based on device and disease states. Performed at [facility], [place] [address] [place], [place] [address]   APTT     Status: None   Collection Time: 11/15/19  8:52 AM  Result Value Ref Range   aPTT 28 24 - 36 seconds    Comment: Performed at [facility] [address], [place], [place] [address]  Vitamin B12     Status: None   Collection Time: 11/15/19  8:52 AM  Result Value Ref Range   Vitamin B-12 244 180 - 914 pg/mL    Comment: (NOTE) This assay is not validated for testing neonatal or myeloproliferative syndrome specimens for Vitamin B12 levels. Performed at [facility], [place] [address], [place], [place] [address]   Hemoglobin and hematocrit, blood     Status: None   Collection Time: 11/15/19  8:55 AM  Result Value Ref Range   Hemoglobin 12.8 12.0 - 15.0 g/dL   HCT 39.0 36.0 - 46.0 %    Comment: Performed at [facility], [place] [address], [place], [place] [address]   Ct Abdomen Pelvis Wo Contrast  Result Date: 11/15/2019 CLINICAL DATA:  Crampy abdominal  pain for the past week with large amount of blood in the stool three days ago and a small amount today. Previous lung surgery for fungal infection. EXAM: CT ABDOMEN AND PELVIS WITHOUT CONTRAST TECHNIQUE: Multidetector CT imaging of the abdomen and pelvis was performed following the standard protocol without IV contrast. COMPARISON:  Chest CT dated 10/21/2014. Abdomen and pelvis CT dated 01/08/2012. FINDINGS: Lower chest: Stable post wedge resection changes in the right lower lobe. Clear left lung base. Normal sized heart. Hepatobiliary: No focal liver abnormality is seen. No gallstones, gallbladder wall thickening, or biliary dilatation. Pancreas: Unremarkable. No pancreatic ductal dilatation or surrounding inflammatory changes. Spleen: Normal in size without focal abnormality. Adrenals/Urinary Tract: Adrenal glands are unremarkable. Kidneys are normal, without renal calculi, focal lesion, or hydronephrosis. Bladder is unremarkable. Stomach/Bowel: Multiple diverticula throughout the colon. No evidence of diverticulitis. Mildly prominent stool. No visible mass. Mild bowel wall thickening and adjacent soft tissue stranding involving a loop of ileum adjacent to the distended sigmoid colon filled with stool and gas in the central pelvis. No pneumatosis. No evidence of appendicitis. Unremarkable stomach. Vascular/Lymphatic: Atheromatous arterial calcifications without aneurysm. No enlarged lymph nodes. Reproductive: Status post hysterectomy. No adnexal masses. Other: No abdominal wall hernia or abnormality. Small amount of free peritoneal fluid in the pelvis. Musculoskeletal: Thoracic and lower lumbar spine degenerative changes and scoliosis. IMPRESSION: 1. Mild bowel wall thickening and adjacent  soft tissue stranding involving a loop of ileum in the central pelvis. This could be due to an infectious or inflammatory enteritis. Ischemic enteritis is less likely. 2. Extensive colonic diverticulosis. 3. Small amount of free  peritoneal fluid in the pelvis. Electronically Signed   By: Claudie Revering M.D.   On: 11/15/2019 12:18   Dg Chest Port 1 View  Result Date: 11/15/2019 CLINICAL DATA:  Bloody stools. EXAM: PORTABLE CHEST 1 VIEW COMPARISON:  10/19/16 FINDINGS: The heart size and mediastinal contours are within normal limits. Aortic atherosclerosis. Mild chronic interstitial coarsening. Both lungs are clear. The visualized skeletal structures are unremarkable. IMPRESSION: No active disease. Electronically Signed   By: Kerby Moors M.D.   On: 11/15/2019 08:11    Pending Labs Unresulted Labs (From admission, onward)    Start     Ordered   11/16/19 0500  CBC  Tomorrow morning,   R     11/15/19 0736   11/16/19 4034  Basic metabolic panel  Tomorrow morning,   R     11/15/19 0736   11/15/19 0900  Hemoglobin and hematocrit, blood  Now then every 6 hours,   R (with STAT occurrences)     11/15/19 0736   11/15/19 0729  Folate RBC  Add-on,   AD     11/15/19 0728          Vitals/Pain Today's Vitals   11/15/19 1100 11/15/19 1115 11/15/19 1245 11/15/19 1252  BP: (!) 156/56 (!) 153/60 (!) 154/56   Pulse: 62 61 (!) 57   Resp: 16 15 19    Temp:      TempSrc:      SpO2: 100% 100% 94%   Weight:      Height:      PainSc: 0-No pain   0-No pain    Isolation Precautions No active isolations  Medications Medications  allopurinol (ZYLOPRIM) tablet 100 mg (has no administration in time range)  zolpidem (AMBIEN) tablet 2.5 mg (has no administration in time range)  fluticasone (FLONASE) 50 MCG/ACT nasal spray 2 spray (has no administration in time range)  sodium chloride flush (NS) 0.9 % injection 3 mL (has no administration in time range)  pantoprazole (PROTONIX) 80 mg in sodium chloride 0.9 % 250 mL (0.32 mg/mL) infusion (8 mg/hr Intravenous New Bag/Given 11/15/19 0903)  pantoprazole (PROTONIX) injection 40 mg (has no administration in time range)  0.9 %  sodium chloride infusion ( Intravenous New Bag/Given  11/15/19 0911)  sodium chloride 0.9 % bolus 1,000 mL (0 mLs Intravenous Stopped 11/15/19 0839)  pantoprazole (PROTONIX) injection 40 mg (40 mg Intravenous Given 11/15/19 0516)  potassium chloride 10 mEq in 100 mL IVPB (10 mEq Intravenous New Bag/Given 11/15/19 1105)    Mobility walks Low fall risk   Focused Assessments Cardiac Assessment Handoff:    No results found for: CKTOTAL, CKMB, CKMBINDEX, TROPONINI No results found for: DDIMER Does the Patient currently have chest pain? No      R Recommendations: See Admitting Provider Note  Report given to:   Additional Notes:

## 2019-11-15 NOTE — ED Triage Notes (Signed)
Pt arrived via [facility]  Pt reports blood in stool since Tuesday. Pt states "the blood came gushing out" and that it was maroon in color. Pt denies N/HA. A&ox4

## 2019-11-15 NOTE — Plan of Care (Signed)
Pt new admit from ED alert and oriented, independent, room air, skin intact, Dx GI bleed, no complain of pain, on clear liquid, peripheral IV with protonix IVPB.

## 2019-11-15 NOTE — H&P (Signed)
History and Physical    April Burns OIN:[id number] DOB: 09/15/1927 DOA: 11/15/2019  Referring MD/NP/PA: Deno Etienne, MD PCP: [name], MD  Patient coming from: Home Via EMS  Chief Complaint: Blood in stools  I have personally briefly reviewed patient's old medical records in [place]   HPI: April Burns is a 85 y.o. female with medical history significant of hypertension, hyperlipidemia, pulmonary nodule s/p biopsy seen to be noncancerous, arthritis, osteoporosis, and restless leg syndrome.  She presents with complaints of blood in stool.  Symptoms initially started 5 days ago where she will reports having severe crampy lower abdominal pain with one episode of nonbloody emesis.  She tried Tylenol for symptoms without significant relief.   For at least the last 3 days she reports having blood in stools.  Initially she had seen bright red blood, but then turned turned to more of a maroon color. Stools were loose and now just liquid.  Associated symptoms include easy bruising and some shortness of.  Denies having any significant fever, lightheadedness, or NSAID use.  She is on a daily aspirin.  She denies this ever happening before in the past.  Her last colonoscopy was over 10 years ago by [facility].  ED Course: Upon admission to the emergency department patient was seen to have stable vital signs.  Labs revealed WBC 10, hemoglobin 10.8, potassium 3.4, BUN 53, and creatinine 1.83.  Stools were reported to be maroon in color.  Patient was given 1 L of normal saline IV fluids and 40 mg of Protonix IV.  Dr. [facility]ail [facility]ermon [facility] was consulted.  [facility] called to admit.  Review of Systems  Constitutional: Positive for chills (Chronic). Negative for fever and weight loss.  HENT: Negative for congestion and sinus pain.   Eyes: Negative for pain and discharge.  Respiratory: Positive for shortness of breath. Negative for cough.   Cardiovascular: Negative for leg swelling.    Gastrointestinal: Positive for abdominal pain, blood in stool, diarrhea, nausea and vomiting.  Genitourinary: Negative for dysuria and hematuria.  Musculoskeletal: Negative for falls.  Skin: Negative for itching and rash.  Neurological: Negative for focal weakness and loss of consciousness.  Endo/Heme/Allergies: Bruises/bleeds easily.  Psychiatric/Behavioral: Negative for memory loss and substance abuse.    Past Medical History:  Diagnosis Date   Allergic rhinitis    Arthritis    gout   Cancer (HCC)    Diverticulosis    no current flare up   Gout    Hearing loss    HTN (hypertension)    Hypercholesterolemia    Lung mass    R lung   Osteopenia    Restless leg syndrome     Past Surgical History:  Procedure Laterality Date   ABDOMINAL HYSTERECTOMY     C section X2     cataract extractions bilateral  2010/2011   EYE SURGERY     bilateral   rt leg vein strippin for varicosities     stoneburner     TAH ovaries intact     VIDEO BRONCHOSCOPY  02/01/2012   Procedure: VIDEO BRONCHOSCOPY;  Surgeon: Grace Isaac, MD;  Location: [facility];  Service: Thoracic;  Laterality: N/A;     reports that she quit smoking about 25 years ago. Her smoking use included cigarettes. She has a 50.00 pack-year smoking history. She has never used smokeless tobacco. She reports current alcohol use of about 10.0 standard drinks of alcohol per week. She reports that she does  not use drugs.  Allergies  Allergen Reactions   Neosporin [Neomycin-Polymyxin-Gramicidin] Dermatitis   Statins Other (See Comments)    Myalgias and unsteady gait    Family History  Problem Relation Age of Onset   Heart disease Father    Alcohol abuse Brother     Prior to Admission medications   Medication Sig Start Date End Date Taking? Authorizing Provider  acetaminophen (TYLENOL) 325 MG tablet Take 650 mg by mouth every 6 (six) hours as needed. For pain   Yes [provider]  allopurinol  (ZYLOPRIM) 100 MG tablet Take 100 mg by mouth daily.   Yes [provider]  amLODipine-olmesartan (AZOR) 10-40 MG tablet Take 0.5 tablets by mouth daily. 09/28/19  Yes [provider]  aspirin 81 MG tablet Take 81 mg by mouth daily.    Yes [provider]  calcium-vitamin D (OSCAL WITH D) 500-200 MG-UNIT per tablet Take 1 tablet by mouth 2 (two) times daily.   Yes [provider]  cloNIDine (CATAPRES) 0.1 MG tablet Take 0.1 mg by mouth at bedtime. 10/27/19  Yes [provider]  EPINEPHrine (EPIPEN 2-PAK) 0.3 mg/0.3 mL DEVI Inject 0.3 mg into the muscle as needed (for allergic reaction).    Yes [provider]  fluticasone (FLONASE) 50 MCG/ACT nasal spray Place 2 sprays into the nose as needed for allergies.    Yes [provider]  ibandronate (BONIVA) 150 MG tablet Take 150 mg by mouth every 30 (thirty) days. 09/30/18  Yes [provider]  LASIX 20 MG tablet Take 20 mg by mouth daily as needed for fluid.  02/04/15  Yes [provider]  zolpidem (AMBIEN) 5 MG tablet Take 2.5 mg by mouth at bedtime.   Yes [provider]    Physical Exam:  Constitutional: Elderly female inNAD, calm, comfortable Vitals:   11/15/19 0439 11/15/19 0442 11/15/19 0630  BP: 133/83  (!) 130/51  Pulse: 73  67  Resp: 14  17  Temp: 98.4 F (36.9 C)    TempSrc: Oral    SpO2: 100%  100%  Weight:  49.9 kg   Height:  5\' 3"  (1.6 m)    Eyes: PERRL, lids and conjunctivae normal ENMT: Mucous membranes are moist. Posterior pharynx clear of any exudate or lesions.Normal dentition.  Neck: normal, supple, no masses, no thyromegaly Respiratory: clear to auscultation bilaterally, no wheezing, no crackles. Normal respiratory effort. No accessory muscle use.  Cardiovascular: Regular rate and rhythm, no murmurs / rubs / gallops. No extremity edema. 2+ pedal pulses. No carotid bruits.  Abdomen: Mild lower abdominal tenderness, no masses palpated.  No hepatosplenomegaly. Bowel sounds positive.  Musculoskeletal: no clubbing / cyanosis. No joint deformity upper and lower extremities. Good ROM, no contractures. Normal muscle tone.  Skin: no rashes, lesions, ulcers. No induration Neurologic: CN 2-12 grossly intact. Sensation intact, DTR normal. Strength 5/5 in all 4.  Psychiatric: Normal judgment and insight. Alert and oriented x 3. Normal mood.     Labs on Admission: I have personally reviewed following labs and imaging studies  CBC: Recent Labs  Lab 11/15/19 0444  WBC 10.0  NEUTROABS 8.0*  HGB 10.8*  HCT 33.5*  MCV 100.3*  PLT 628   Basic Metabolic Panel: Recent Labs  Lab 11/15/19 0444  NA 136  K 3.4*  CL 99  CO2 21*  GLUCOSE 126*  BUN 53*  CREATININE 1.83*  CALCIUM 9.0   GFR: Estimated Creatinine Clearance: 16.1 mL/min (A) (by C-G formula based on  SCr of 1.83 mg/dL (H)). Liver Function Tests: Recent Labs  Lab 11/15/19 0444  AST 16  ALT 12  ALKPHOS 98  BILITOT 1.2  PROT 6.8  ALBUMIN 3.3*   No results for input(s): LIPASE, AMYLASE in the last 168 hours. No results for input(s): AMMONIA in the last 168 hours. Coagulation Profile: No results for input(s): INR, PROTIME in the last 168 hours. Cardiac Enzymes: No results for input(s): CKTOTAL, CKMB, CKMBINDEX, TROPONINI in the last 168 hours. BNP (last 3 results) No results for input(s): PROBNP in the last 8760 hours. HbA1C: No results for input(s): HGBA1C in the last 72 hours. CBG: No results for input(s): GLUCAP in the last 168 hours. Lipid Profile: No results for input(s): CHOL, HDL, LDLCALC, TRIG, CHOLHDL, LDLDIRECT in the last 72 hours. Thyroid Function Tests: No results for input(s): TSH, T4TOTAL, FREET4, T3FREE, THYROIDAB in the last 72 hours. Anemia Panel: No results for input(s): VITAMINB12, FOLATE, FERRITIN, TIBC, IRON, RETICCTPCT in the last 72 hours. Urine analysis:    Component Value Date/Time   COLORURINE STRAW (A) 10/29/2018 0834    APPEARANCEUR CLEAR 10/29/2018 0834   LABSPEC 1.009 10/29/2018 0834   PHURINE 6.0 10/29/2018 0834   GLUCOSEU NEGATIVE 10/29/2018 0834   HGBUR SMALL (A) 10/29/2018 0834   BILIRUBINUR NEGATIVE 10/29/2018 0834   [place] 10/29/2018 0834   PROTEINUR NEGATIVE 10/29/2018 0834   UROBILINOGEN 0.2 02/18/2012 0844   NITRITE NEGATIVE 10/29/2018 0834   LEUKOCYTESUR NEGATIVE 10/29/2018 0834   Sepsis Labs: No results found for this or any previous visit (from the past 240 hour(s)).   Radiological Exams on Admission: Ct Abdomen Pelvis Wo Contrast  Result Date: 11/15/2019 CLINICAL DATA:  Crampy abdominal pain for the past week with large amount of blood in the stool three days ago and a small amount today. Previous lung surgery for fungal infection. EXAM: CT ABDOMEN AND PELVIS WITHOUT CONTRAST TECHNIQUE: Multidetector CT imaging of the abdomen and pelvis was performed following the standard protocol without IV contrast. COMPARISON:  Chest CT dated 10/21/2014. Abdomen and pelvis CT dated 01/08/2012. FINDINGS: Lower chest: Stable post wedge resection changes in the right lower lobe. Clear left lung base. Normal sized heart. Hepatobiliary: No focal liver abnormality is seen. No gallstones, gallbladder wall thickening, or biliary dilatation. Pancreas: Unremarkable. No pancreatic ductal dilatation or surrounding inflammatory changes. Spleen: Normal in size without focal abnormality. Adrenals/Urinary Tract: Adrenal glands are unremarkable. Kidneys are normal, without renal calculi, focal lesion, or hydronephrosis. Bladder is unremarkable. Stomach/Bowel: Multiple diverticula throughout the colon. No evidence of diverticulitis. Mildly prominent stool. No visible mass. Mild bowel wall thickening and adjacent soft tissue stranding involving a loop of ileum adjacent to the distended sigmoid colon filled with stool and gas in the central pelvis. No pneumatosis. No evidence of appendicitis. Unremarkable stomach.  Vascular/Lymphatic: Atheromatous arterial calcifications without aneurysm. No enlarged lymph nodes. Reproductive: Status post hysterectomy. No adnexal masses. Other: No abdominal wall hernia or abnormality. Small amount of free peritoneal fluid in the pelvis. Musculoskeletal: Thoracic and lower lumbar spine degenerative changes and scoliosis. IMPRESSION: 1. Mild bowel wall thickening and adjacent soft tissue stranding involving a loop of ileum in the central pelvis. This could be due to an infectious or inflammatory enteritis. Ischemic enteritis is less likely. 2. Extensive colonic diverticulosis. 3. Small amount of free peritoneal fluid in the pelvis. Electronically Signed   By: Claudie Revering M.D.   On: 11/15/2019 12:18   Dg Chest Port 1 View  Result Date: 11/15/2019 CLINICAL DATA:  Bloody stools. EXAM: PORTABLE CHEST 1 VIEW COMPARISON:  10/19/16 FINDINGS: The heart size and mediastinal contours are within normal limits. Aortic atherosclerosis. Mild chronic interstitial coarsening. Both lungs are clear. The visualized skeletal structures are unremarkable. IMPRESSION: No active disease. Electronically Signed   By: Kerby Moors M.D.   On: 11/15/2019 08:11    EKG: Independently reviewed.  Sinus rhythm at 64 bpm with RBBB and LAFB unchanged from previous  Assessment/Plan Upper GI bleed, acute blood loss anemia: Patient presents with reports of 5 days of blood in her stools.  The elevated BUN is concern for upper GI bleed.  She reports taking a daily aspirin. [facility] was consulted.  Patient had been typed and screened for possible need of blood products.  CT scan of the abdomen and pelvis did note mild thickening involving the loop of ileum.  Question the possibility of underlying malignancy vs. ischemic colitis. -Admit to a medical telemetry bed -Add-on PT/INR, APTT, vitamin B12, and folate -Check chest x-ray -Hold aspirin -Start Protonix drip -Serial monitoring of H&H -transfuse blood products as  needed for hemoglobin less than 7 or if patient becomes symptomatic -Appreciate GI consultative services, will follow-up for further recommendations  Suspected acute kidney injury superimposed on chronic kidney disease stage III: Patient presents with creatinine elevated at 1.83 and BUN 53.  Previous creatinine noted to be around 1.46 and 2019.  The elevated BUN to creatinine ratio suggest prerenal cause of symptoms. -Monitor intake and output -Normal saline IV fluids at 75 mL/h as tolerated -Recheck creatinine in a.m.  Essential hypertension: On admission blood pressures appear to be stable.  At home patient on clonidine, amlodipine- olmesartan, and Lasix. -Holding antihypertensive agents at this time with signs of acute kidney injury  Hypokalemia: Acute.  Potassium mildly 3.4. -Give 20 mEq of potassium chloride IV -Continue to monitor replace as needed  Osteopenia: Patient on Boniva in outpatient setting.  Last dose on 10/31/2019. -Continue Boniva in outpatient setting  History of lung nodule: Patient reports that this was biopsied with bronchoscopy and noted to be negative for signs of malignancy  DVT prophylaxis: SCDs Code Status: Full Family Communication: Discussed plan of care with the patient and family member present at bedside Disposition Plan: Likely discharge in 1 to 2 days after evaluation and hemoglobin stable Consults called: Gastroenterology Admission status: Observation  Norval Morton MD [facility] Pager [phone]   If 7PM-7AM, please contact night-coverage [email or web address] Password [facility]  11/15/2019, 7:19 AM

## 2019-11-15 NOTE — Plan of Care (Signed)
Pt new admit from ED Dx GI bleeding, alert and oriented, room air, independent, right arm peripheral IV line, on clear liquid, no complain of pain at this time.

## 2019-11-15 NOTE — ED Provider Notes (Signed)
[facility] EMERGENCY DEPARTMENT Provider Note   CSN: [id number] Arrival date & time: 11/15/19  0434     History   Chief Complaint Chief Complaint  Patient presents with  . Blood In Stools    HPI April Burns is a 85 y.o. female.     85 yo F with a chief complaints of blood in her stool.  This is described as April Burns has been going on for about 5 days now.  Patient has had some worsening over the past 48 hours.  Called her doctor in the middle the night after she had a large bloody bowel movement.  He then told her to come to the ED for evaluation.  No significant lightheaded or dizziness.  Denies abdominal pain.  Has had some mild abdominal cramping prior to bowel movement.  Has had some diarrhea though she has difficulty telling me how many bowel movements she has had a day.  The history is provided by the patient.  Illness Severity:  Moderate Onset quality:  Gradual Duration:  5 days Timing:  Constant Progression:  Worsening Chronicity:  New Associated symptoms: diarrhea   Associated symptoms: no chest pain, no congestion, no fever, no headaches, no myalgias, no nausea, no rhinorrhea, no shortness of breath, no vomiting and no wheezing     Past Medical History:  Diagnosis Date  . Allergic rhinitis   . Arthritis    gout  . Cancer ([place])   . Diverticulosis    no current flare up  . Gout   . Hearing loss   . HTN (hypertension)   . Hypercholesterolemia   . Lung mass    R lung  . Osteopenia   . Restless leg syndrome     Patient Active Problem List   Diagnosis Date Noted  . Lung mass 02/19/2012  . Hypercholesterolemia   . Gout   . Allergic rhinitis   . Restless leg syndrome   . HTN (hypertension)   . Hearing loss   . Osteopenia     Past Surgical History:  Procedure Laterality Date  . ABDOMINAL HYSTERECTOMY    . C section X2    . cataract extractions bilateral  2010/2011  . EYE SURGERY     bilateral  . rt leg vein strippin  for varicosities    . stoneburner    . TAH ovaries intact    . VIDEO BRONCHOSCOPY  02/01/2012   Procedure: VIDEO BRONCHOSCOPY;  Surgeon: Grace Isaac, MD;  Location: [facility] OR;  Service: Thoracic;  Laterality: N/A;     OB History   No obstetric history on file.      Home Medications    Prior to Admission medications   Medication Sig Start Date End Date Taking? Authorizing Provider  acetaminophen (TYLENOL) 325 MG tablet Take 650 mg by mouth every 6 (six) hours as needed. For pain   Yes [provider]  allopurinol (ZYLOPRIM) 100 MG tablet Take 100 mg by mouth daily.   Yes [provider]  amLODipine-olmesartan (AZOR) 10-40 MG tablet Take 0.5 tablets by mouth daily. 09/28/19  Yes [provider]  aspirin 81 MG tablet Take 81 mg by mouth daily.    Yes [provider]  calcium-vitamin D (OSCAL WITH D) 500-200 MG-UNIT per tablet Take 1 tablet by mouth 2 (two) times daily.   Yes [provider]  cloNIDine (CATAPRES) 0.1 MG tablet Take 0.1 mg by mouth at bedtime. 10/27/19  Yes [provider]  EPINEPHrine (  EPIPEN 2-PAK) 0.3 mg/0.3 mL DEVI Inject 0.3 mg into the muscle as needed (for allergic reaction).    Yes [provider]  fluticasone (FLONASE) 50 MCG/ACT nasal spray Place 2 sprays into the nose as needed for allergies.    Yes [provider]  ibandronate (BONIVA) 150 MG tablet Take 150 mg by mouth every 30 (thirty) days. 09/30/18  Yes [provider]  LASIX 20 MG tablet Take 20 mg by mouth daily as needed for fluid.  02/04/15  Yes [provider]  zolpidem (AMBIEN) 5 MG tablet Take 2.5 mg by mouth at bedtime.   Yes [provider]    Family History Family History  Problem Relation Age of Onset  . Heart disease Father   . Alcohol abuse Brother     Social History Social History   Tobacco Use  . Smoking status: Former Smoker    Packs/day: 1.00    Years: 50.00    Pack years: 50.00     Types: Cigarettes    Quit date: 12/31/1993    Years since quitting: 25.8  . Smokeless tobacco: Never Used  Substance Use Topics  . Alcohol use: Yes    Alcohol/week: 10.0 standard drinks    Types: 10 Standard drinks or equivalent per week  . Drug use: No     Allergies   Neosporin [neomycin-polymyxin-gramicidin] and Statins   Review of Systems Review of Systems  Constitutional: Negative for chills and fever.  HENT: Negative for congestion and rhinorrhea.   Eyes: Negative for redness and visual disturbance.  Respiratory: Negative for shortness of breath and wheezing.   Cardiovascular: Negative for chest pain and palpitations.  Gastrointestinal: Positive for blood in stool and diarrhea. Negative for nausea and vomiting.  Genitourinary: Negative for dysuria and urgency.  Musculoskeletal: Negative for arthralgias and myalgias.  Skin: Negative for pallor and wound.  Neurological: Negative for dizziness and headaches.     Physical Exam Updated Vital Signs BP (!) 130/51   Pulse 67   Temp 98.4 F (36.9 C) (Oral)   Resp 17   Ht 5\' 3"  (1.6 m)   Wt 49.9 kg   SpO2 100%   BMI 19.49 kg/m   Physical Exam Vitals signs and nursing note reviewed.  Constitutional:      General: She is not in acute distress.    Appearance: She is well-developed. She is not diaphoretic.  HENT:     Head: Normocephalic and atraumatic.  Eyes:     Pupils: Pupils are equal, round, and reactive to light.  Neck:     Musculoskeletal: Normal range of motion and neck supple.  Cardiovascular:     Rate and Rhythm: Normal rate and regular rhythm.     Heart sounds: No murmur. No friction rub. No gallop.   Pulmonary:     Effort: Pulmonary effort is normal.     Breath sounds: No wheezing or rales.  Abdominal:     General: There is no distension.     Palpations: Abdomen is soft.     Tenderness: There is no abdominal tenderness.  Genitourinary:    Comments: Grossly melanotic stool Musculoskeletal:         General: No tenderness.  Skin:    General: Skin is warm and dry.  Neurological:     Mental Status: She is alert and oriented to person, place, and time.  Psychiatric:        Behavior: Behavior normal.      ED Treatments / Results  Labs (all labs ordered are listed, but only abnormal results are displayed) Labs Reviewed  CBC WITH DIFFERENTIAL/PLATELET - Abnormal; Notable for the following components:      Result Value   RBC 3.34 (*)    Hemoglobin 10.8 (*)    HCT 33.5 (*)    MCV 100.3 (*)    Neutro Abs 8.0 (*)    All other components within normal limits  COMPREHENSIVE METABOLIC PANEL - Abnormal; Notable for the following components:   Potassium 3.4 (*)    CO2 21 (*)    Glucose, Bld 126 (*)    BUN 53 (*)    Creatinine, Ser 1.83 (*)    Albumin 3.3 (*)    GFR calc non Af Amer 24 (*)    GFR calc Af Amer 28 (*)    Anion gap 16 (*)    All other components within normal limits  SARS CORONAVIRUS 2 (TAT 6-24 HRS)  TYPE AND SCREEN    EKG None  Radiology No results found.  Procedures Procedures (including critical care time)  Medications Ordered in ED Medications  sodium chloride 0.9 % bolus 1,000 mL (1,000 mLs Intravenous New Bag/Given 11/15/19 0516)  pantoprazole (PROTONIX) injection 40 mg (40 mg Intravenous Given 11/15/19 0516)     Initial Impression / Assessment and Plan / ED Course  I have reviewed the triage vital signs and the nursing notes.  Pertinent labs & imaging results that were available during my care of the patient were reviewed by me and considered in my medical decision making (see chart for details).        85 yo F with a chief complaints of blood in her stool.  Going on for 5 days now.  No abdominal tenderness no history of a prior GI bleed.  Has not had a colonoscopy in quite some time.  Will obtain lab work.  Lab work shows a hemoglobin that slightly above her baseline.  Her BUN is slightly elevated.  Given a dose of Protonix.  I discussed  case with Dr. Michail Sermon, [facility] gastroenterology they will see the patient as a consult.  Will discuss with hospitalist for admission.  The patients results and plan were reviewed and discussed.   Any x-rays performed were independently reviewed by myself.   Differential diagnosis were considered with the presenting HPI.  Medications  sodium chloride 0.9 % bolus 1,000 mL (1,000 mLs Intravenous New Bag/Given 11/15/19 0516)  pantoprazole (PROTONIX) injection 40 mg (40 mg Intravenous Given 11/15/19 0516)    Vitals:   11/15/19 0439 11/15/19 0442 11/15/19 0630  BP: 133/83  (!) 130/51  Pulse: 73  67  Resp: 14  17  Temp: 98.4 F (36.9 C)    TempSrc: Oral    SpO2: 100%  100%  Weight:  49.9 kg   Height:  5\' 3"  (1.6 m)     Final diagnoses:  Melena    Admission/ observation were discussed with the admitting physician, patient and/or family and they are comfortable with the plan.    Final Clinical Impressions(s) / ED Diagnoses   Final diagnoses:  Melena    ED Discharge Orders    None       Deno Etienne, DO 11/15/19 9450

## 2019-11-15 NOTE — Consult Note (Signed)
Referring Provider: Dr. Tyrone Nine Primary Care Physician:  [name], MD Primary Gastroenterologist:  Althia Forts  Reason for Consultation:  GI bleed  HPI: April Burns is a 85 y.o. female who had the acute onset of bright red blood per rectum that started 5 days ago. Had one episode daily of bleeding that was red and dark red to maroon and unable to tell whether stool was with the bleeding. Denies black stools. Having lower abdominal cramping as well. Denies dizziness. Denies diarrhea. Did feel short of breath per her daughter. She had one episode of nonbloody vomiting a few days before the rectal beeding started. Last colonoscopy in June 2003 by Dr. Sammuel Cooper that showed extensive diverticulosis. Hgb 10.8 on admit and now 12.8. No significant weight loss.  Past Medical History:  Diagnosis Date  . Allergic rhinitis   . Arthritis    gout  . Diverticulosis    no current flare up  . Gout   . Hearing loss   . HTN (hypertension)   . Hypercholesterolemia   . Lung nodule    Status post bronchoscopy reported to be noncancerous  . Osteopenia   . Restless leg syndrome     Past Surgical History:  Procedure Laterality Date  . ABDOMINAL HYSTERECTOMY    . C section X2    . cataract extractions bilateral  2010/2011  . EYE SURGERY     bilateral  . rt leg vein strippin for varicosities    . stoneburner    . TAH ovaries intact    . VIDEO BRONCHOSCOPY  02/01/2012   Procedure: VIDEO BRONCHOSCOPY;  Surgeon: Grace Isaac, MD;  Location: [facility] OR;  Service: Thoracic;  Laterality: N/A;    Prior to Admission medications   Medication Sig Start Date End Date Taking? Authorizing Provider  acetaminophen (TYLENOL) 325 MG tablet Take 650 mg by mouth every 6 (six) hours as needed. For pain   Yes [provider]  allopurinol (ZYLOPRIM) 100 MG tablet Take 100 mg by mouth daily.   Yes [provider]  amLODipine-olmesartan (AZOR) 10-40 MG tablet Take 0.5 tablets by mouth daily.  09/28/19  Yes [provider]  aspirin 81 MG tablet Take 81 mg by mouth daily.    Yes [provider]  calcium-vitamin D (OSCAL WITH D) 500-200 MG-UNIT per tablet Take 1 tablet by mouth 2 (two) times daily.   Yes [provider]  cloNIDine (CATAPRES) 0.1 MG tablet Take 0.1 mg by mouth at bedtime. 10/27/19  Yes [provider]  EPINEPHrine (EPIPEN 2-PAK) 0.3 mg/0.3 mL DEVI Inject 0.3 mg into the muscle as needed (for allergic reaction).    Yes [provider]  fluticasone (FLONASE) 50 MCG/ACT nasal spray Place 2 sprays into the nose as needed for allergies.    Yes [provider]  ibandronate (BONIVA) 150 MG tablet Take 150 mg by mouth every 30 (thirty) days. 09/30/18  Yes [provider]  LASIX 20 MG tablet Take 20 mg by mouth daily as needed for fluid.  02/04/15  Yes [provider]  zolpidem (AMBIEN) 5 MG tablet Take 2.5 mg by mouth at bedtime.   Yes [provider]    Scheduled Meds: . allopurinol  100 mg Oral Daily  . [START ON 11/18/2019] pantoprazole  40 mg Intravenous Q12H  . sodium chloride flush  3 mL Intravenous Q12H  . zolpidem  2.5 mg Oral QHS   Continuous Infusions: . sodium chloride 75 mL/hr at 11/15/19 0911  . pantoprozole (  PROTONIX) infusion 8 mg/hr (11/15/19 0903)   PRN Meds:.fluticasone  Allergies as of 11/15/2019 - Review Complete 11/15/2019  Allergen Reaction Noted  . Neosporin [neomycin-polymyxin-gramicidin] Dermatitis 01/17/2012  . Statins Other (See Comments) 01/17/2012    Family History  Problem Relation Age of Onset  . Heart disease Father   . Alcohol abuse Brother     Social History   Socioeconomic History  . Marital status: Single    Spouse name: Not on file  . Number of children: Not on file  . Years of education: Not on file  . Highest education level: Not on file  Occupational History  . Not on file  Social Needs  . Financial resource strain: Not on file  . Food  insecurity    Worry: Not on file    Inability: Not on file  . Transportation needs    Medical: Not on file    Non-medical: Not on file  Tobacco Use  . Smoking status: Former Smoker    Packs/day: 1.00    Years: 50.00    Pack years: 50.00    Types: Cigarettes    Quit date: 12/31/1993    Years since quitting: 25.8  . Smokeless tobacco: Never Used  Substance and Sexual Activity  . Alcohol use: Yes    Alcohol/week: 10.0 standard drinks    Types: 10 Standard drinks or equivalent per week  . Drug use: No  . Sexual activity: Not on file  Lifestyle  . Physical activity    Days per week: Not on file    Minutes per session: Not on file  . Stress: Not on file  Relationships  . Social Herbalist on phone: Not on file    Gets together: Not on file    Attends religious service: Not on file    Active member of club or organization: Not on file    Attends meetings of clubs or organizations: Not on file    Relationship status: Not on file  . Intimate partner violence    Fear of current or ex partner: Not on file    Emotionally abused: Not on file    Physically abused: Not on file    Forced sexual activity: Not on file  Other Topics Concern  . 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   One of her daughter's has bipolar disease. Margorie John- in-Law Ray Church died suddenly of MI at age 1 yo.     Review of Systems: All negative except as stated above in HPI.  Physical Exam: Vital signs: Vitals:   11/15/19 0945 11/15/19 1030  BP:    Pulse: 73 66  Resp: 17 18  Temp:    SpO2: 99% 100%  BP 154/56, T 98.4   General:  Lethargic, thin, elderly, pleasant and cooperative in NAD Head: normocephalic, atraumatic Eyes: anicteric sclera ENT: oropharynx clear Neck: supple, nontender Lungs:  Clear throughout to auscultation.   No wheezes, crackles, or rhonchi. No acute distress. Heart:  Regular rate and rhythm; no  murmurs, clicks, rubs,  or gallops. Abdomen: lower abdominal tenderness with guarding, soft, nondistended, +BS  Rectal:  Deferred Ext: no edema  GI:  Lab Results: Recent Labs    11/15/19 0444 11/15/19 0855  WBC 10.0  --   HGB 10.8* 12.8  HCT 33.5* 39.0  PLT 322  --    BMET Recent Labs    11/15/19 0444  NA 136  K 3.4*  CL 99  CO2 21*  GLUCOSE 126*  BUN 53*  CREATININE 1.83*  CALCIUM 9.0   LFT Recent Labs    11/15/19 0444  PROT 6.8  ALBUMIN 3.3*  AST 16  ALT 12  ALKPHOS 98  BILITOT 1.2   PT/INR Recent Labs    11/15/19 0852  LABPROT 14.0  INR 1.1     Studies/Results: Dg Chest Port 1 View  Result Date: 11/15/2019 CLINICAL DATA:  Bloody stools. EXAM: PORTABLE CHEST 1 VIEW COMPARISON:  10/19/16 FINDINGS: The heart size and mediastinal contours are within normal limits. Aortic atherosclerosis. Mild chronic interstitial coarsening. Both lungs are clear. The visualized skeletal structures are unremarkable. IMPRESSION: No active disease. Electronically Signed   By: Kerby Moors M.D.   On: 11/15/2019 08:11    Impression/Plan: 85 yo with rectal bleeding and lower abdominal cramping. CT showed mild bowel wall thickening of a ileal loop. No obstruction or mass seen. Question ischemic colitis vs malignancy based on presentation. If bleeding continues then may need an updated colonoscopy. Clear liquid diet. Supportive care. Dr. Therisa Doyne to f/u tomorrow.      LOS: 0 days   Lear Ng  11/15/2019, 10:45 AM  Questions please call [phone]

## 2019-11-16 DIAGNOSIS — M810 Age-related osteoporosis without current pathological fracture: Secondary | ICD-10-CM | POA: Diagnosis present

## 2019-11-16 DIAGNOSIS — K922 Gastrointestinal hemorrhage, unspecified: Secondary | ICD-10-CM | POA: Diagnosis not present

## 2019-11-16 DIAGNOSIS — Z79899 Other long term (current) drug therapy: Secondary | ICD-10-CM | POA: Diagnosis not present

## 2019-11-16 DIAGNOSIS — R1084 Generalized abdominal pain: Secondary | ICD-10-CM | POA: Diagnosis not present

## 2019-11-16 DIAGNOSIS — K5731 Diverticulosis of large intestine without perforation or abscess with bleeding: Secondary | ICD-10-CM | POA: Diagnosis present

## 2019-11-16 DIAGNOSIS — Z87891 Personal history of nicotine dependence: Secondary | ICD-10-CM | POA: Diagnosis not present

## 2019-11-16 DIAGNOSIS — Z20828 Contact with and (suspected) exposure to other viral communicable diseases: Secondary | ICD-10-CM | POA: Diagnosis present

## 2019-11-16 DIAGNOSIS — E7800 Pure hypercholesterolemia, unspecified: Secondary | ICD-10-CM | POA: Diagnosis present

## 2019-11-16 DIAGNOSIS — E785 Hyperlipidemia, unspecified: Secondary | ICD-10-CM | POA: Diagnosis present

## 2019-11-16 DIAGNOSIS — Z7982 Long term (current) use of aspirin: Secondary | ICD-10-CM | POA: Diagnosis not present

## 2019-11-16 DIAGNOSIS — I129 Hypertensive chronic kidney disease with stage 1 through stage 4 chronic kidney disease, or unspecified chronic kidney disease: Secondary | ICD-10-CM | POA: Diagnosis present

## 2019-11-16 DIAGNOSIS — D62 Acute posthemorrhagic anemia: Secondary | ICD-10-CM | POA: Diagnosis present

## 2019-11-16 DIAGNOSIS — Z90710 Acquired absence of both cervix and uterus: Secondary | ICD-10-CM | POA: Diagnosis not present

## 2019-11-16 DIAGNOSIS — K559 Vascular disorder of intestine, unspecified: Secondary | ICD-10-CM | POA: Diagnosis present

## 2019-11-16 DIAGNOSIS — N179 Acute kidney failure, unspecified: Secondary | ICD-10-CM | POA: Diagnosis present

## 2019-11-16 DIAGNOSIS — E876 Hypokalemia: Secondary | ICD-10-CM | POA: Diagnosis present

## 2019-11-16 DIAGNOSIS — K921 Melena: Secondary | ICD-10-CM | POA: Diagnosis not present

## 2019-11-16 DIAGNOSIS — G2581 Restless legs syndrome: Secondary | ICD-10-CM | POA: Diagnosis present

## 2019-11-16 DIAGNOSIS — R935 Abnormal findings on diagnostic imaging of other abdominal regions, including retroperitoneum: Secondary | ICD-10-CM | POA: Diagnosis not present

## 2019-11-16 DIAGNOSIS — H9190 Unspecified hearing loss, unspecified ear: Secondary | ICD-10-CM | POA: Diagnosis present

## 2019-11-16 DIAGNOSIS — N183 Chronic kidney disease, stage 3 unspecified: Secondary | ICD-10-CM | POA: Diagnosis present

## 2019-11-16 LAB — CBC
HCT: 27.3 % — ABNORMAL LOW (ref 36.0–46.0)
HCT: 28.5 % — ABNORMAL LOW (ref 36.0–46.0)
Hemoglobin: 9 g/dL — ABNORMAL LOW (ref 12.0–15.0)
Hemoglobin: 9 g/dL — ABNORMAL LOW (ref 12.0–15.0)
MCH: 32.7 pg (ref 26.0–34.0)
MCH: 32 pg (ref 26.0–34.0)
MCHC: 33 g/dL (ref 30.0–36.0)
MCHC: 31.6 g/dL (ref 30.0–36.0)
MCV: 99.3 fL (ref 80.0–100.0)
MCV: 101.4 fL — ABNORMAL HIGH (ref 80.0–100.0)
Platelets: 260 10*3/uL (ref 150–400)
Platelets: 297 10*3/uL (ref 150–400)
RBC: 2.75 MIL/uL — ABNORMAL LOW (ref 3.87–5.11)
RBC: 2.81 MIL/uL — ABNORMAL LOW (ref 3.87–5.11)
RDW: 13 % (ref 11.5–15.5)
RDW: 12.8 % (ref 11.5–15.5)
WBC: 7.5 10*3/uL (ref 4.0–10.5)
WBC: 7.8 10*3/uL (ref 4.0–10.5)
nRBC: 0 % (ref 0.0–0.2)
nRBC: 0 % (ref 0.0–0.2)

## 2019-11-16 LAB — BASIC METABOLIC PANEL
Anion gap: 9 (ref 5–15)
BUN: 29 mg/dL — ABNORMAL HIGH (ref 8–23)
CO2: 21 mmol/L — ABNORMAL LOW (ref 22–32)
Calcium: 8.5 mg/dL — ABNORMAL LOW (ref 8.9–10.3)
Chloride: 111 mmol/L (ref 98–111)
Creatinine, Ser: 1.35 mg/dL — ABNORMAL HIGH (ref 0.44–1.00)
GFR calc Af Amer: 40 mL/min — ABNORMAL LOW (ref >60)
GFR calc non Af Amer: 34 mL/min — ABNORMAL LOW (ref >60)
Glucose, Bld: 98 mg/dL (ref 70–99)
Potassium: 4.4 mmol/L (ref 3.5–5.1)
Sodium: 141 mmol/L (ref 135–145)

## 2019-11-16 LAB — FOLATE RBC
Folate, Hemolysate: 399 ng/mL
Folate, RBC: 1275 ng/mL (ref >498)
Hematocrit: 31.3 % — ABNORMAL LOW (ref 34.0–46.6)

## 2019-11-16 MED ORDER — POLYETHYLENE GLYCOL 3350 17 G PO PACK
17.0000 g | PACK | Freq: Two times a day (BID) | ORAL | Status: DC
  Administered 2019-11-16 – 2019-11-17 (×3): 17 g via ORAL
  Filled 2019-11-16 (×4): qty 1

## 2019-11-16 MED ORDER — POLYETHYLENE GLYCOL 3350 17 G PO PACK: 17.0000 g | PACK | Freq: Every day | ORAL | Status: DC

## 2019-11-16 MED ORDER — AMLODIPINE BESYLATE 5 MG PO TABS
5.0000 mg | ORAL_TABLET | Freq: Every day | ORAL | Status: DC
  Administered 2019-11-16 – 2019-11-19 (×4): 5 mg via ORAL
  Filled 2019-11-16 (×4): qty 1

## 2019-11-16 MED ORDER — PANTOPRAZOLE SODIUM 40 MG PO TBEC
40.0000 mg | DELAYED_RELEASE_TABLET | Freq: Every day | ORAL | Status: DC
  Administered 2019-11-16 – 2019-11-19 (×4): 40 mg via ORAL
  Filled 2019-11-16 (×4): qty 1

## 2019-11-16 NOTE — Progress Notes (Addendum)
PROGRESS NOTE    April Burns  FBP:[id number] DOB: 09/21/1927 DOA: 11/15/2019 PCP: [name], MD  Brief Narrative: HPI: April Burns is a 85 y.o. female with medical history significant of hypertension, hyperlipidemia, pulmonary nodule s/p biopsy seen to be noncancerous, arthritis, osteoporosis, and restless leg syndrome  presented with complaints of blood in stool.  Symptoms initially started 5 days ago where she will reports having severe crampy lower abdominal pain with one episode of nonbloody emesis. -  For at least the last 3 days she reports having blood in stools.  Initially she had seen bright red blood, but then turned turned to more of a maroon color.  -Denies having any significant fever, lightheadedness, or NSAID use.  She is on a daily aspirin.   -Her last colonoscopy was 2003 which noted diverticulosis -The emergency room she was noted to have a hemoglobin of 10.8 which is slightly lower than   Assessment & Plan:   Lower GI bleed, acute blood loss anemia -Suspect resolving ischemic colitis versus diverticular bleeding -Appears to have resolved now -Mild worsening hemoglobin, will check anemia panel -Gastroenterology consulted, recommended clear liquid diet, laxatives and monitoring for now -Check hemoglobin every 12 -Stop IV PPI  Acute kidney injury superimposed on chronic kidney disease stage III: -On admission creatinine was 1.8, baseline is closer to 1.4 -Now improved with hydration back to basal range, stop fluids and monitor  Essential hypertension:  -Holding clonidine, ARB, Lasix at this time -Restart amlodipine today  Hypokalemia:  -Replaced  History of lung nodule: Patient reports that this was biopsied with bronchoscopy and noted to be negative for malignancy  DVT prophylaxis: SCDs Code Status: Full Family Communication:  Discussed with patient and bedside Disposition Plan: Likely discharge in 1 to 2 days if no further bleeding  and hemoglobin stable Consults called: Gastroenterology   Procedures:   Antimicrobials:    Subjective: -Denies any further bleeding, reports that her stool was dark brownish earlier today  Objective: Vitals:   11/15/19 1330 11/15/19 1419 11/15/19 2012 11/16/19 0331  BP: (!) 125/51 (!) 154/57 130/61 140/64  Pulse: (!) 58 60 65 72  Resp: 16 16 16 18   Temp:  97.8 F (36.6 C) 99.1 F (37.3 C) 97.7 F (36.5 C)  TempSrc:  Oral  Oral  SpO2: 99% 100% 99% 100%  Weight:  49.4 kg    Height:  5\' 2"  (1.575 m)      Intake/Output Summary (Last 24 hours) at 11/16/2019 1215 Last data filed at 11/16/2019 0300 Gross per 24 hour  Intake 1567.93 ml  Output -  Net 1567.93 ml   Filed Weights   11/15/19 0442 11/15/19 1419  Weight: 49.9 kg 49.4 kg    Examination:  General exam: Thinly built elderly female AAOx3, no distress Respiratory system: Clear to auscultation. Respiratory effort normal. Cardiovascular system: S1 & S2 heard, RRR.  Gastrointestinal system: Abdomen is nondistended, soft and nontender.Normal bowel sounds heard. Central nervous system: Alert and oriented. No focal neurological deficits. Extremities: No edema Skin: No rashes, lesions or ulcers Psychiatry: Judgement and insight appear normal. Mood & affect appropriate.     Data Reviewed:   CBC: Recent Labs  Lab 11/15/19 0444 11/15/19 0855 11/15/19 1534 11/16/19 0319  WBC 10.0  --   --  7.5  NEUTROABS 8.0*  --   --   --   HGB 10.8* 12.8 9.7* 9.0*  HCT 33.5* 39.0 30.1* 27.3*  MCV 100.3*  --   --  99.3  PLT 322  --   --  350   Basic Metabolic Panel: Recent Labs  Lab 11/15/19 0444 11/16/19 0319  NA 136 141  K 3.4* 4.4  CL 99 111  CO2 21* 21*  GLUCOSE 126* 98  BUN 53* 29*  CREATININE 1.83* 1.35*  CALCIUM 9.0 8.5*   GFR: Estimated Creatinine Clearance: 21.6 mL/min (A) (by C-G formula based on SCr of 1.35 mg/dL (H)). Liver Function Tests: Recent Labs  Lab 11/15/19 0444  AST 16  ALT 12   ALKPHOS 98  BILITOT 1.2  PROT 6.8  ALBUMIN 3.3*   No results for input(s): LIPASE, AMYLASE in the last 168 hours. No results for input(s): AMMONIA in the last 168 hours. Coagulation Profile: Recent Labs  Lab 11/15/19 0852  INR 1.1   Cardiac Enzymes: No results for input(s): CKTOTAL, CKMB, CKMBINDEX, TROPONINI in the last 168 hours. BNP (last 3 results) No results for input(s): PROBNP in the last 8760 hours. HbA1C: No results for input(s): HGBA1C in the last 72 hours. CBG: No results for input(s): GLUCAP in the last 168 hours. Lipid Profile: No results for input(s): CHOL, HDL, LDLCALC, TRIG, CHOLHDL, LDLDIRECT in the last 72 hours. Thyroid Function Tests: No results for input(s): TSH, T4TOTAL, FREET4, T3FREE, THYROIDAB in the last 72 hours. Anemia Panel: Recent Labs    11/15/19 0852  VITAMINB12 244   Urine analysis:    Component Value Date/Time   COLORURINE STRAW (A) 10/29/2018 0834   APPEARANCEUR CLEAR 10/29/2018 0834   LABSPEC 1.009 10/29/2018 0834   PHURINE 6.0 10/29/2018 0834   GLUCOSEU NEGATIVE 10/29/2018 0834   HGBUR SMALL (A) 10/29/2018 0834   BILIRUBINUR NEGATIVE 10/29/2018 0834   KETONESUR NEGATIVE 10/29/2018 0834   PROTEINUR NEGATIVE 10/29/2018 0834   UROBILINOGEN 0.2 02/18/2012 0844   NITRITE NEGATIVE 10/29/2018 0834   LEUKOCYTESUR NEGATIVE 10/29/2018 0834   Sepsis Labs: @LABRCNTIP (procalcitonin:4,lacticidven:4)  ) Recent Results (from the past 240 hour(s))  SARS CORONAVIRUS 2 (TAT 6-24 HRS) Nasopharyngeal Nasopharyngeal Swab     Status: None   Collection Time: 11/15/19  6:18 AM   Specimen: Nasopharyngeal Swab  Result Value Ref Range Status   SARS Coronavirus 2 NEGATIVE NEGATIVE Final    Comment: (NOTE) SARS-CoV-2 target nucleic acids are NOT DETECTED. The SARS-CoV-2 RNA is generally detectable in upper and lower respiratory specimens during the acute phase of infection. Negative results do not preclude SARS-CoV-2 infection, do not rule out  co-infections with other pathogens, and should not be used as the sole basis for treatment or other patient management decisions. Negative results must be combined with clinical observations, patient history, and epidemiological information. The expected result is Negative. Fact Sheet for Patients: SugarRoll.be Fact Sheet for Healthcare Providers: https://www.woods-mathews.com/ This test is not yet approved or cleared by the [place] FDA and  has been authorized for detection and/or diagnosis of SARS-CoV-2 by FDA under an Emergency Use Authorization (EUA). This EUA will remain  in effect (meaning this test can be used) for the duration of the COVID-19 declaration under Section 56 4(b)(1) of the Act, 21 U.S.C. section 360bbb-3(b)(1), unless the authorization is terminated or revoked sooner. Performed at [facility], [place] [address], [place], [place] [address]          Radiology Studies: Ct Abdomen Pelvis Wo Contrast  Result Date: 11/15/2019 CLINICAL DATA:  Crampy abdominal pain for the past week with large amount of blood in the stool three days ago and a small amount today. Previous lung surgery for  fungal infection. EXAM: CT ABDOMEN AND PELVIS WITHOUT CONTRAST TECHNIQUE: Multidetector CT imaging of the abdomen and pelvis was performed following the standard protocol without IV contrast. COMPARISON:  Chest CT dated 10/21/2014. Abdomen and pelvis CT dated 01/08/2012. FINDINGS: Lower chest: Stable post wedge resection changes in the right lower lobe. Clear left lung base. Normal sized heart. Hepatobiliary: No focal liver abnormality is seen. No gallstones, gallbladder wall thickening, or biliary dilatation. Pancreas: Unremarkable. No pancreatic ductal dilatation or surrounding inflammatory changes. Spleen: Normal in size without focal abnormality. Adrenals/Urinary Tract: Adrenal glands are unremarkable. Kidneys are normal, without renal  calculi, focal lesion, or hydronephrosis. Bladder is unremarkable. Stomach/Bowel: Multiple diverticula throughout the colon. No evidence of diverticulitis. Mildly prominent stool. No visible mass. Mild bowel wall thickening and adjacent soft tissue stranding involving a loop of ileum adjacent to the distended sigmoid colon filled with stool and gas in the central pelvis. No pneumatosis. No evidence of appendicitis. Unremarkable stomach. Vascular/Lymphatic: Atheromatous arterial calcifications without aneurysm. No enlarged lymph nodes. Reproductive: Status post hysterectomy. No adnexal masses. Other: No abdominal wall hernia or abnormality. Small amount of free peritoneal fluid in the pelvis. Musculoskeletal: Thoracic and lower lumbar spine degenerative changes and scoliosis. IMPRESSION: 1. Mild bowel wall thickening and adjacent soft tissue stranding involving a loop of ileum in the central pelvis. This could be due to an infectious or inflammatory enteritis. Ischemic enteritis is less likely. 2. Extensive colonic diverticulosis. 3. Small amount of free peritoneal fluid in the pelvis. Electronically Signed   By: Claudie Revering M.D.   On: 11/15/2019 12:18   Dg Chest Port 1 View  Result Date: 11/15/2019 CLINICAL DATA:  Bloody stools. EXAM: PORTABLE CHEST 1 VIEW COMPARISON:  10/19/16 FINDINGS: The heart size and mediastinal contours are within normal limits. Aortic atherosclerosis. Mild chronic interstitial coarsening. Both lungs are clear. The visualized skeletal structures are unremarkable. IMPRESSION: No active disease. Electronically Signed   By: Kerby Moors M.D.   On: 11/15/2019 08:11        Scheduled Meds: . allopurinol  100 mg Oral Daily  . pantoprazole  40 mg Oral Q1200  . polyethylene glycol  17 g Oral BID  . sodium chloride flush  3 mL Intravenous Q12H  . zolpidem  2.5 mg Oral QHS   Continuous Infusions:   LOS: 0 days    Time spent: 45min    Domenic Polite, MD [facility]  Page via [email or web address], password [email or web address] After 7PM please contact night-coverage  11/16/2019, 12:15 PM

## 2019-11-16 NOTE — Plan of Care (Signed)

## 2019-11-16 NOTE — Care Management Obs Status (Signed)
[facility] NOTIFICATION   Patient Details  Name: April Burns MRN: [id number] Date of Birth: [birth year]   Medicare Observation Status Notification Given:  Yes    [name], RN 11/16/2019, 10:20 AM

## 2019-11-16 NOTE — Progress Notes (Signed)
[facility] PROGRESS NOTE Subjective Admitted yesterday with several days bleeding, BRB to darker, and initial severe cramping abdominal pain, now better. No further bleeding. Formed stool. Last colon more than 10 years ago by [employer]. CT shows tics and thickened area of TI  Objective: Vital signs in last 24 hours: Temp:  [97.7 F (36.5 C)-99.1 F (37.3 C)] 97.7 F (36.5 C) (11/16 0331) Pulse Rate:  [57-73] 72 (11/16 0331) Resp:  [15-20] 18 (11/16 0331) BP: (125-156)/(51-64) 140/64 (11/16 0331) SpO2:  [94 %-100 %] 100 % (11/16 0331) Weight:  [49.4 kg] 49.4 kg (11/15 1419) Last BM Date: 11/16/19  Intake/Output from previous day: 11/15 0701 - 11/16 0700 In: 2667.9 [I.V.:1467.9; IV Piggyback:1200] Out: -  Intake/Output this shift: No intake/output data recorded.  PE: Doreene Eland NAD  Abdomen--soft  Non tender  Lab Results: Recent Labs    11/15/19 0444 11/15/19 0855 11/15/19 1534 11/16/19 0319  WBC 10.0  --   --  7.5  HGB 10.8* 12.8 9.7* 9.0*  HCT 33.5* 39.0 30.1* 27.3*  PLT 322  --   --  260   BMET Recent Labs    11/15/19 0444 11/16/19 0319  NA 136 141  K 3.4* 4.4  CL 99 111  CO2 21* 21*  CREATININE 1.83* 1.35*   LFT Recent Labs    11/15/19 0444  PROT 6.8  AST 16  ALT 12  ALKPHOS 98  BILITOT 1.2   PT/INR Recent Labs    11/15/19 0852  LABPROT 14.0  INR 1.1   PANCREAS No results for input(s): LIPASE in the last 72 hours.       Studies/Results: Ct Abdomen Pelvis Wo Contrast  Result Date: 11/15/2019 CLINICAL DATA:  Crampy abdominal pain for the past week with large amount of blood in the stool three days ago and a small amount today. Previous lung surgery for fungal infection. EXAM: CT ABDOMEN AND PELVIS WITHOUT CONTRAST TECHNIQUE: Multidetector CT imaging of the abdomen and pelvis was performed following the standard protocol without IV contrast. COMPARISON:  Chest CT dated 10/21/2014. Abdomen and pelvis CT dated 01/08/2012.  FINDINGS: Lower chest: Stable post wedge resection changes in the right lower lobe. Clear left lung base. Normal sized heart. Hepatobiliary: No focal liver abnormality is seen. No gallstones, gallbladder wall thickening, or biliary dilatation. Pancreas: Unremarkable. No pancreatic ductal dilatation or surrounding inflammatory changes. Spleen: Normal in size without focal abnormality. Adrenals/Urinary Tract: Adrenal glands are unremarkable. Kidneys are normal, without renal calculi, focal lesion, or hydronephrosis. Bladder is unremarkable. Stomach/Bowel: Multiple diverticula throughout the colon. No evidence of diverticulitis. Mildly prominent stool. No visible mass. Mild bowel wall thickening and adjacent soft tissue stranding involving a loop of ileum adjacent to the distended sigmoid colon filled with stool and gas in the central pelvis. No pneumatosis. No evidence of appendicitis. Unremarkable stomach. Vascular/Lymphatic: Atheromatous arterial calcifications without aneurysm. No enlarged lymph nodes. Reproductive: Status post hysterectomy. No adnexal masses. Other: No abdominal wall hernia or abnormality. Small amount of free peritoneal fluid in the pelvis. Musculoskeletal: Thoracic and lower lumbar spine degenerative changes and scoliosis. IMPRESSION: 1. Mild bowel wall thickening and adjacent soft tissue stranding involving a loop of ileum in the central pelvis. This could be due to an infectious or inflammatory enteritis. Ischemic enteritis is less likely. 2. Extensive colonic diverticulosis. 3. Small amount of free peritoneal fluid in the pelvis. Electronically Signed   By: Claudie Revering M.D.   On: 11/15/2019 12:18   Dg Chest [facility] 1 View  Result  Date: 11/15/2019 CLINICAL DATA:  Bloody stools. EXAM: PORTABLE CHEST 1 VIEW COMPARISON:  10/19/16 FINDINGS: The heart size and mediastinal contours are within normal limits. Aortic atherosclerosis. Mild chronic interstitial coarsening. Both lungs are clear. The  visualized skeletal structures are unremarkable. IMPRESSION: No active disease. Electronically Signed   By: Kerby Moors M.D.   On: 11/15/2019 08:11    Medications: I have reviewed the patient's current medications.  Assessment:   1. GI Bleed/Abdominal Pain. Resolving, ? Ischemic diverticular etc.   Plan: 1. Continue the clear liquids and would start miralax to soften stools. I clears rapidly, would follow ? Colon at some point in future if stools remain positive   Nancy Fetter 11/16/2019, 9:01 AM  This note was created using voice recognition software. Minor errors may Have occurred unintentionally.  Pager: [phone] If no answer or after hours call [phone]

## 2019-11-16 NOTE — TOC Initial Note (Signed)
Transition of Care [facility]) - Initial/Assessment Note    Patient Details  Name: April Burns MRN: [id number] Date of Birth: [birth year]  Transition of Care [facility]) CM/SW Contact:    [name], RN Phone Number: 11/16/2019, 10:22 AM  Clinical Narrative:                 Confirmed face sheet information. Patient from home with daughter. Has PCP Dr Alfonso Patten. April Burns has transportation to appointments and can afford medications.  Expected Discharge Plan: Home/Self Care Barriers to Discharge: Continued Medical Work up   Patient Goals and CMS Choice Patient states their goals for this hospitalization and ongoing recovery are:: to go home CMS Medicare.gov Compare Post Acute Care list provided to:: Patient Choice offered to / list presented to : Patient  Expected Discharge Plan and Services Expected Discharge Plan: Home/Self Care       Living arrangements for the past 2 months: Single Family Home                 DME Arranged: N/A         HH Arranged: NA          Prior Living Arrangements/Services Living arrangements for the past 2 months: Single Family Home Lives with:: Adult Children Patient language and need for interpreter reviewed:: Yes Do you feel safe going back to the place where you live?: Yes      Need for Family Participation in Patient Care: No (Comment) Care giver support system in place?: Yes (comment)   Criminal Activity/Legal Involvement Pertinent to Current Situation/Hospitalization: No - Comment as needed  Activities of Daily Living Home Assistive Devices/Equipment: None ADL Screening (condition at time of admission) Patient's cognitive ability adequate to safely complete daily activities?: Yes Is the patient deaf or have difficulty hearing?: No Does the patient have difficulty seeing, even when wearing glasses/contacts?: No Does the patient have difficulty concentrating, remembering, or making decisions?: No Patient able to express need for  assistance with ADLs?: Yes Does the patient have difficulty dressing or bathing?: No Independently performs ADLs?: Yes (appropriate for developmental age) Does the patient have difficulty walking or climbing stairs?: No Weakness of Legs: None Weakness of Arms/Hands: None  Permission Sought/Granted   Permission granted to share information with : No              Emotional Assessment Appearance:: Appears younger than stated age Attitude/Demeanor/Rapport: Engaged Affect (typically observed): Accepting Orientation: : Oriented to Self, Oriented to Place, Oriented to  Time, Oriented to Situation Alcohol / Substance Use: Not Applicable Psych Involvement: No (comment)  Admission diagnosis:  Melena [K92.1] Upper GI bleed [K92.2] Patient Active Problem List   Diagnosis Date Noted  . Upper GI bleed 11/15/2019  . Acute kidney injury superimposed on CKD ([place]) 11/15/2019  . Hypokalemia 11/15/2019  . Acute blood loss anemia 11/15/2019  . Lung mass 02/19/2012  . Hypercholesterolemia   . Gout   . Allergic rhinitis   . Restless leg syndrome   . HTN (hypertension)   . Hearing loss   . Osteopenia    PCP:  [name], MD Pharmacy:   [facility] [place], [place] - [address] [place] [address] Phone: [phone] Fax: [phone]     Social Determinants of Health (SDOH) Interventions    Readmission Risk Interventions No flowsheet data found.

## 2019-11-17 LAB — CBC
HCT: 26.3 % — ABNORMAL LOW (ref 36.0–46.0)
Hemoglobin: 8.5 g/dL — ABNORMAL LOW (ref 12.0–15.0)
MCH: 32.4 pg (ref 26.0–34.0)
MCHC: 32.3 g/dL (ref 30.0–36.0)
MCV: 100.4 fL — ABNORMAL HIGH (ref 80.0–100.0)
Platelets: 287 10*3/uL (ref 150–400)
RBC: 2.62 MIL/uL — ABNORMAL LOW (ref 3.87–5.11)
RDW: 12.9 % (ref 11.5–15.5)
WBC: 7.1 10*3/uL (ref 4.0–10.5)
nRBC: 0 % (ref 0.0–0.2)

## 2019-11-17 LAB — BASIC METABOLIC PANEL
Anion gap: 9 (ref 5–15)
BUN: 15 mg/dL (ref 8–23)
CO2: 22 mmol/L (ref 22–32)
Calcium: 8.8 mg/dL — ABNORMAL LOW (ref 8.9–10.3)
Chloride: 110 mmol/L (ref 98–111)
Creatinine, Ser: 1.31 mg/dL — ABNORMAL HIGH (ref 0.44–1.00)
GFR calc Af Amer: 41 mL/min — ABNORMAL LOW (ref >60)
GFR calc non Af Amer: 36 mL/min — ABNORMAL LOW (ref >60)
Glucose, Bld: 99 mg/dL (ref 70–99)
Potassium: 3.8 mmol/L (ref 3.5–5.1)
Sodium: 141 mmol/L (ref 135–145)

## 2019-11-17 NOTE — Plan of Care (Signed)
  Problem: Activity: Goal: Risk for activity intolerance will decrease Outcome: Progressing   Problem: Nutrition: Goal: Adequate nutrition will be maintained Outcome: Progressing   Problem: Elimination: Goal: Will not experience complications related to bowel motility Outcome: Progressing   

## 2019-11-17 NOTE — Progress Notes (Signed)
[facility] PROGRESS NOTE Subjective Patient feels much better.  Pain and discomfort gone she is on MiraLAX and brown bowel movement.  Hemoglobin has dropped to 8.5 but no gross bleeding  Objective: Vital signs in last 24 hours: Temp:  [97.8 F (36.6 C)-98.5 F (36.9 C)] 98.5 F (36.9 C) (11/17 0550) Pulse Rate:  [63-72] 72 (11/17 0550) Resp:  [17-18] 17 (11/17 0550) BP: (122-127)/(60-74) 122/69 (11/17 0550) SpO2:  [98 %-100 %] 98 % (11/17 0550) Last BM Date: 11/16/19  Intake/Output from previous day: 11/16 0701 - 11/17 0700 In: 1560 [P.O.:1560] Out: -  Intake/Output this shift: No intake/output data recorded.  PE: General--alert no distress Abdomen--flat, soft good bowel sounds and nontender  Lab Results: Recent Labs    11/15/19 0444  11/15/19 0855 11/15/19 1534 11/16/19 0319 11/16/19 1638 11/17/19 0215  WBC 10.0  --   --   --  7.5 7.8 7.1  HGB 10.8*  --  12.8 9.7* 9.0* 9.0* 8.5*  HCT 33.5*   < > 39.0 30.1* 27.3* 28.5* 26.3*  PLT 322  --   --   --  260 297 287   < > = values in this interval not displayed.   BMET Recent Labs    11/15/19 0444 11/16/19 0319 11/17/19 0215  NA 136 141 141  K 3.4* 4.4 3.8  CL 99 111 110  CO2 21* 21* 22  CREATININE 1.83* 1.35* 1.31*   LFT Recent Labs    11/15/19 0444  PROT 6.8  AST 16  ALT 12  ALKPHOS 98  BILITOT 1.2   PT/INR Recent Labs    11/15/19 0852  LABPROT 14.0  INR 1.1   PANCREAS No results for input(s): LIPASE in the last 72 hours.       Studies/Results: Ct Abdomen Pelvis Wo Contrast  Result Date: 11/15/2019 CLINICAL DATA:  Crampy abdominal pain for the past week with large amount of blood in the stool three days ago and a small amount today. Previous lung surgery for fungal infection. EXAM: CT ABDOMEN AND PELVIS WITHOUT CONTRAST TECHNIQUE: Multidetector CT imaging of the abdomen and pelvis was performed following the standard protocol without IV contrast. COMPARISON:  Chest CT dated  10/21/2014. Abdomen and pelvis CT dated 01/08/2012. FINDINGS: Lower chest: Stable post wedge resection changes in the right lower lobe. Clear left lung base. Normal sized heart. Hepatobiliary: No focal liver abnormality is seen. No gallstones, gallbladder wall thickening, or biliary dilatation. Pancreas: Unremarkable. No pancreatic ductal dilatation or surrounding inflammatory changes. Spleen: Normal in size without focal abnormality. Adrenals/Urinary Tract: Adrenal glands are unremarkable. Kidneys are normal, without renal calculi, focal lesion, or hydronephrosis. Bladder is unremarkable. Stomach/Bowel: Multiple diverticula throughout the colon. No evidence of diverticulitis. Mildly prominent stool. No visible mass. Mild bowel wall thickening and adjacent soft tissue stranding involving a loop of ileum adjacent to the distended sigmoid colon filled with stool and gas in the central pelvis. No pneumatosis. No evidence of appendicitis. Unremarkable stomach. Vascular/Lymphatic: Atheromatous arterial calcifications without aneurysm. No enlarged lymph nodes. Reproductive: Status post hysterectomy. No adnexal masses. Other: No abdominal wall hernia or abnormality. Small amount of free peritoneal fluid in the pelvis. Musculoskeletal: Thoracic and lower lumbar spine degenerative changes and scoliosis. IMPRESSION: 1. Mild bowel wall thickening and adjacent soft tissue stranding involving a loop of ileum in the central pelvis. This could be due to an infectious or inflammatory enteritis. Ischemic enteritis is less likely. 2. Extensive colonic diverticulosis. 3. Small amount of free peritoneal fluid in the  pelvis. Electronically Signed   By: Claudie Revering M.D.   On: 11/15/2019 12:18    Medications: I have reviewed the patient's current medications.  Assessment:   1.  GI bleed.  Probably lower GI questionable ischemic injury versus diverticulosis.  Patient clearly improving.  Discussed colonoscopy with patient, she does  not wish to have colonoscopy unless absolutely necessary.   Plan: We will go ahead and advance to full liquid diet.  If her hemoglobin stabilizes and she has no further symptoms she could probably be discharged.  Would like to see her hemoglobin stabilized.  Would suggest that she follow-up with Dr. Michail Sermon as outpatient.  I told her that if her stools continue to be positive for blood she should reconsider colonoscopy.   Nancy Fetter 11/17/2019, 9:26 AM  This note was created using voice recognition software. Minor errors may Have occurred unintentionally.  Pager: [phone] If no answer or after hours call [phone]

## 2019-11-17 NOTE — Progress Notes (Signed)
PROGRESS NOTE    April Burns  DQQ:[id number] DOB: 1927-11-01 DOA: 11/15/2019 PCP: [name], MD  Brief Narrative:  April Burns is a 85 y.o. female with medical history significant of hypertension, hyperlipidemia, pulmonary nodule s/p biopsy seen to be noncancerous, arthritis, osteoporosis, and restless leg syndrome  presented with complaints of blood in stool.   Symptoms initially started 5 days ago where she will reports having severe crampy lower abdominal pain with one episode of nonbloody emesis. For at least the last 3 days she reports having blood in stools.  Initially she had seen bright red blood, but then turned turned to more of a maroon color. Denies having any significant fever, lightheadedness, or NSAID use.  She is on a daily aspirin.  Her last colonoscopy was 2003 which noted diverticulosis. The emergency room she was noted to have a hemoglobin of 10.8 which is slightly lower than baseline.  [facility] was consulted for admission for suspected GI bleed.   Assessment & Plan:   Lower GI bleed, acute blood loss anemia  CT abdomen/pelvis notable for mild bowel wall thickening/soft tissue stranding loop of ileum in the central pelvis consistent with infectious versus inflammatory enteritis, extensive colonic diverticulosis and small amount of peritoneal fluid in the pelvis. --Suspect diverticular bleed --[facility] following, Dr. Oletta Lamas appreciate assistance --Hgb 10.8, 12.8, 9.7, 9.0, 8.5 --Small bowel movement reported overnight, no blood noted --Protonix 40 mg p.o. daily --Continue clear liquid diet; monitoring of bowel movement/hemoglobin  Acute kidney injury superimposed on chronic kidney disease stage III: On admission creatinine was 1.8, baseline is closer to 1.4 --Creatinine 1.31 this morning; back to baseline --Avoid nephrotoxins, renally dose all medications  Essential hypertension:  BP 122/69 this morning, well controlled --Continue  amlodipine --Holding clonidine, ARB, Lasix at this time  Hypokalemia:  Repleted during hospitalization.  Potassium 3.8 this morning.  History of lung nodule:  Patient reports that this was biopsied with bronchoscopy and noted to be negative for malignancy. --Continue outpatient follow-up  DVT prophylaxis: SCDs Code Status: Full Family Communication:  Discussed with patient and bedside Disposition Plan: Likely discharge in 1 to 2 days if no further bleeding and hemoglobin stable Consults called:  [facility], Dr. Oletta Lamas   Procedures: none     Subjective: Patient seen and examined at bedside, resting comfortably.  Reports "gurgling in her stomach".  Reports small bowel movement overnight without any appreciable blood.  No other complaints or concerns at this time.  Denies headache, no fever/chills/night sweats, no nausea/vomiting/diarrhea, no chest pain, no palpitations, no shortness of breath, no abdominal pain, no weakness, no fatigue, no paresthesias.  No acute events overnight per nursing staff.  Objective: Vitals:   11/16/19 0331 11/16/19 1657 11/16/19 2025 11/17/19 0550  BP: 140/64 127/60 125/74 122/69  Pulse: 72 63 70 72  Resp: 18  18 17   Temp: 97.7 F (36.5 C) 98.3 F (36.8 C) 97.8 F (36.6 C) 98.5 F (36.9 C)  TempSrc: Oral Oral Oral Oral  SpO2: 100% 100% 100% 98%  Weight:      Height:        Intake/Output Summary (Last 24 hours) at 11/17/2019 0816 Last data filed at 11/17/2019 0015 Gross per 24 hour  Intake 1560 ml  Output --  Net 1560 ml   Filed Weights   11/15/19 0442 11/15/19 1419  Weight: 49.9 kg 49.4 kg    Examination:  General exam: Thinly built elderly female AAOx3, no distress Respiratory system: Clear to auscultation. Respiratory effort normal.  Cardiovascular system: S1 & S2 heard, RRR.  Gastrointestinal system: Abdomen is nondistended, soft and nontender.Normal bowel sounds heard. Central nervous system: Alert and oriented.  No focal neurological deficits. Extremities: No edema Skin: No rashes, lesions or ulcers Psychiatry: Judgement and insight appear normal. Mood & affect appropriate.     Data Reviewed:   CBC: Recent Labs  Lab 11/15/19 0444  11/15/19 0855 11/15/19 1534 11/16/19 0319 11/16/19 1638 11/17/19 0215  WBC 10.0  --   --   --  7.5 7.8 7.1  NEUTROABS 8.0*  --   --   --   --   --   --   HGB 10.8*  --  12.8 9.7* 9.0* 9.0* 8.5*  HCT 33.5*   < > 39.0 30.1* 27.3* 28.5* 26.3*  MCV 100.3*  --   --   --  99.3 101.4* 100.4*  PLT 322  --   --   --  260 297 287   < > = values in this interval not displayed.   Basic Metabolic Panel: Recent Labs  Lab 11/15/19 0444 11/16/19 0319 11/17/19 0215  NA 136 141 141  K 3.4* 4.4 3.8  CL 99 111 110  CO2 21* 21* 22  GLUCOSE 126* 98 99  BUN 53* 29* 15  CREATININE 1.83* 1.35* 1.31*  CALCIUM 9.0 8.5* 8.8*   GFR: Estimated Creatinine Clearance: 22.3 mL/min (A) (by C-G formula based on SCr of 1.31 mg/dL (H)). Liver Function Tests: Recent Labs  Lab 11/15/19 0444  AST 16  ALT 12  ALKPHOS 98  BILITOT 1.2  PROT 6.8  ALBUMIN 3.3*   No results for input(s): LIPASE, AMYLASE in the last 168 hours. No results for input(s): AMMONIA in the last 168 hours. Coagulation Profile: Recent Labs  Lab 11/15/19 0852  INR 1.1   Cardiac Enzymes: No results for input(s): CKTOTAL, CKMB, CKMBINDEX, TROPONINI in the last 168 hours. BNP (last 3 results) No results for input(s): PROBNP in the last 8760 hours. HbA1C: No results for input(s): HGBA1C in the last 72 hours. CBG: No results for input(s): GLUCAP in the last 168 hours. Lipid Profile: No results for input(s): CHOL, HDL, LDLCALC, TRIG, CHOLHDL, LDLDIRECT in the last 72 hours. Thyroid Function Tests: No results for input(s): TSH, T4TOTAL, FREET4, T3FREE, THYROIDAB in the last 72 hours. Anemia Panel: Recent Labs    11/15/19 0852  VITAMINB12 244   Urine analysis:    Component Value Date/Time    COLORURINE STRAW (A) 10/29/2018 0834   APPEARANCEUR CLEAR 10/29/2018 0834   LABSPEC 1.009 10/29/2018 0834   PHURINE 6.0 10/29/2018 0834   GLUCOSEU NEGATIVE 10/29/2018 0834   HGBUR SMALL (A) 10/29/2018 0834   BILIRUBINUR NEGATIVE 10/29/2018 0834   KETONESUR NEGATIVE 10/29/2018 0834   PROTEINUR NEGATIVE 10/29/2018 0834   UROBILINOGEN 0.2 02/18/2012 0844   NITRITE NEGATIVE 10/29/2018 0834   LEUKOCYTESUR NEGATIVE 10/29/2018 0834   Sepsis Labs: @LABRCNTIP (procalcitonin:4,lacticidven:4)  ) Recent Results (from the past 240 hour(s))  SARS CORONAVIRUS 2 (TAT 6-24 HRS) Nasopharyngeal Nasopharyngeal Swab     Status: None   Collection Time: 11/15/19  6:18 AM   Specimen: Nasopharyngeal Swab  Result Value Ref Range Status   SARS Coronavirus 2 NEGATIVE NEGATIVE Final    Comment: (NOTE) SARS-CoV-2 target nucleic acids are NOT DETECTED. The SARS-CoV-2 RNA is generally detectable in upper and lower respiratory specimens during the acute phase of infection. Negative results do not preclude SARS-CoV-2 infection, do not rule out co-infections with other pathogens, and should not be  used as the sole basis for treatment or other patient management decisions. Negative results must be combined with clinical observations, patient history, and epidemiological information. The expected result is Negative. Fact Sheet for Patients: SugarRoll.be Fact Sheet for Healthcare Providers: https://www.woods-mathews.com/ This test is not yet approved or cleared by the [place] FDA and  has been authorized for detection and/or diagnosis of SARS-CoV-2 by FDA under an Emergency Use Authorization (EUA). This EUA will remain  in effect (meaning this test can be used) for the duration of the COVID-19 declaration under Section 56 4(b)(1) of the Act, 21 U.S.C. section 360bbb-3(b)(1), unless the authorization is terminated or revoked sooner. Performed at [facility], [place] [address], [place], [place] [address]          Radiology Studies: Ct Abdomen Pelvis Wo Contrast  Result Date: 11/15/2019 CLINICAL DATA:  Crampy abdominal pain for the past week with large amount of blood in the stool three days ago and a small amount today. Previous lung surgery for fungal infection. EXAM: CT ABDOMEN AND PELVIS WITHOUT CONTRAST TECHNIQUE: Multidetector CT imaging of the abdomen and pelvis was performed following the standard protocol without IV contrast. COMPARISON:  Chest CT dated 10/21/2014. Abdomen and pelvis CT dated 01/08/2012. FINDINGS: Lower chest: Stable post wedge resection changes in the right lower lobe. Clear left lung base. Normal sized heart. Hepatobiliary: No focal liver abnormality is seen. No gallstones, gallbladder wall thickening, or biliary dilatation. Pancreas: Unremarkable. No pancreatic ductal dilatation or surrounding inflammatory changes. Spleen: Normal in size without focal abnormality. Adrenals/Urinary Tract: Adrenal glands are unremarkable. Kidneys are normal, without renal calculi, focal lesion, or hydronephrosis. Bladder is unremarkable. Stomach/Bowel: Multiple diverticula throughout the colon. No evidence of diverticulitis. Mildly prominent stool. No visible mass. Mild bowel wall thickening and adjacent soft tissue stranding involving a loop of ileum adjacent to the distended sigmoid colon filled with stool and gas in the central pelvis. No pneumatosis. No evidence of appendicitis. Unremarkable stomach. Vascular/Lymphatic: Atheromatous arterial calcifications without aneurysm. No enlarged lymph nodes. Reproductive: Status post hysterectomy. No adnexal masses. Other: No abdominal wall hernia or abnormality. Small amount of free peritoneal fluid in the pelvis. Musculoskeletal: Thoracic and lower lumbar spine degenerative changes and scoliosis. IMPRESSION: 1. Mild bowel wall thickening and adjacent soft tissue stranding involving a loop of ileum in the  central pelvis. This could be due to an infectious or inflammatory enteritis. Ischemic enteritis is less likely. 2. Extensive colonic diverticulosis. 3. Small amount of free peritoneal fluid in the pelvis. Electronically Signed   By: Claudie Revering M.D.   On: 11/15/2019 12:18        Scheduled Meds:  allopurinol  100 mg Oral Daily   amLODipine  5 mg Oral Daily   pantoprazole  40 mg Oral Q1200   polyethylene glycol  17 g Oral BID   sodium chloride flush  3 mL Intravenous Q12H   zolpidem  2.5 mg Oral QHS   Continuous Infusions:   LOS: 1 day    Time spent: 32 minutes spent on chart review, discussion with nursing staff, consultants, updating family and interview/physical exam; more than 50% of that time was spent in counseling and/or coordination of care.    Retal Tonkinson [place] DO [facility] 11/17/2019, 8:16 AM

## 2019-11-18 LAB — CBC
HCT: 26.8 % — ABNORMAL LOW (ref 36.0–46.0)
Hemoglobin: 8.8 g/dL — ABNORMAL LOW (ref 12.0–15.0)
MCH: 32.6 pg (ref 26.0–34.0)
MCHC: 32.8 g/dL (ref 30.0–36.0)
MCV: 99.3 fL (ref 80.0–100.0)
Platelets: 309 10*3/uL (ref 150–400)
RBC: 2.7 MIL/uL — ABNORMAL LOW (ref 3.87–5.11)
RDW: 12.8 % (ref 11.5–15.5)
WBC: 6.1 10*3/uL (ref 4.0–10.5)
nRBC: 0 % (ref 0.0–0.2)

## 2019-11-18 LAB — BASIC METABOLIC PANEL
Anion gap: 8 (ref 5–15)
BUN: 9 mg/dL (ref 8–23)
CO2: 24 mmol/L (ref 22–32)
Calcium: 8.9 mg/dL (ref 8.9–10.3)
Chloride: 108 mmol/L (ref 98–111)
Creatinine, Ser: 1.14 mg/dL — ABNORMAL HIGH (ref 0.44–1.00)
GFR calc Af Amer: 49 mL/min — ABNORMAL LOW (ref >60)
GFR calc non Af Amer: 42 mL/min — ABNORMAL LOW (ref >60)
Glucose, Bld: 103 mg/dL — ABNORMAL HIGH (ref 70–99)
Potassium: 3.9 mmol/L (ref 3.5–5.1)
Sodium: 140 mmol/L (ref 135–145)

## 2019-11-18 MED ORDER — POLYETHYLENE GLYCOL 3350 17 G PO PACK
17.0000 g | PACK | Freq: Every day | ORAL | Status: DC
  Administered 2019-11-18 – 2019-11-19 (×2): 17 g via ORAL
  Filled 2019-11-18 (×2): qty 1

## 2019-11-18 NOTE — Progress Notes (Signed)
PROGRESS NOTE    April Burns  ZSW:[id number] DOB: March 28, 1927 DOA: 11/15/2019 PCP: [name], MD   Brief Narrative: 85 year old with past medical history significant for hypertension, hyperlipidemia, pulmonary nodule status post biopsy seem to be noncancerous, arthritis who presents with complaints of blood in the stool. She also reports cramping abdominal pain with episode of nonbloody emesis.  She reports 3 days of bloody stool. Was admitted for further evaluation of GI bleed.   Assessment & Plan:   Principal Problem:   Upper GI bleed Active Problems:   Hypercholesterolemia   HTN (hypertension)   Osteopenia   Acute kidney injury superimposed on CKD (HCC)   Hypokalemia   Acute blood loss anemia   1-lower GI bleed, acute blood loss anemia CT abdomen and pelvis showed mild bowel wall thickening/soft tissue stranding loop of the ileum consistent with infectious versus inflammatory enteritis, chronic diverticulosis. Secondary to ischemic colitis versus diverticular bleed Hemoglobin has remained stable at 8.8 today. Continue with PPI. Plan to advance diet to full liquid diet, change MiraLAX to daily.  And monitor overnight  2-AKI superimposed on chronic kidney disease a stage III On admission creatinine was 1.8, baseline 1.4 Improved.  Hold nephrotoxins  Hypertension; Continue with Norvasc.  Hold Lasix due to AKI.  Holding clonidine due to GI bleed.  Hypokalemia: Resolved History of lung nodules: Follow with PCP     Estimated body mass index is 19.94 kg/m as calculated from the following:   Height as of this encounter: 5\' 2"  (1.575 m).   Weight as of this encounter: 49.4 kg.   DVT prophylaxis: SCDs Code Status: Full code Family Communication: Care discussed with patient Disposition Plan: Patient continued to have cramping abdominal pain, GI is recommending to observe patient 1 more day in the hospital due to risk for recurrence of bleed, patient age and  multiple comorbidities Consultants:   GI  Procedures:   None  Antimicrobials:    Subjective: She still complaining of cramping abdominal pain, somewhat improved but persistent.  She had a small bowel movement.  No blood on it  Objective: Vitals:   11/17/19 1440 11/17/19 2140 11/18/19 0555 11/18/19 1336  BP: (!) 125/54 (!) 134/58 (!) 151/66 120/65  Pulse: 63 63 60 (!) 57  Resp: 18 17 18 18   Temp: 98.6 F (37 C) 98.7 F (37.1 C) 99.2 F (37.3 C) 98.9 F (37.2 C)  TempSrc: Oral Oral Oral Oral  SpO2: 98% 100% 100% 100%  Weight:      Height:        Intake/Output Summary (Last 24 hours) at 11/18/2019 1634 Last data filed at 11/18/2019 0954 Gross per 24 hour  Intake 540 ml  Output -  Net 540 ml   Filed Weights   11/15/19 0442 11/15/19 1419  Weight: 49.9 kg 49.4 kg    Examination:  General exam: Appears calm and comfortable  Respiratory system: Clear to auscultation. Respiratory effort normal. Cardiovascular system: S1 & S2 heard, RRR. No JVD, murmurs, rubs, gallops or clicks. No pedal edema. Gastrointestinal system: Abdomen is nondistended, soft and nontender. No organomegaly or masses felt. Normal bowel sounds heard. Central nervous system: Alert and oriented. No focal neurological deficits. Extremities: Symmetric 5 x 5 power. Skin: No rashes, lesions or ulcers Psychiatry: Judgement and insight appear normal. Mood & affect appropriate.     Data Reviewed: I have personally reviewed following labs and imaging studies  CBC: Recent Labs  Lab 11/15/19 0444  11/15/19 1534 11/16/19 0319 11/16/19 1638 11/17/19  0215 11/18/19 0204  WBC 10.0  --   --  7.5 7.8 7.1 6.1  NEUTROABS 8.0*  --   --   --   --   --   --   HGB 10.8*   < > 9.7* 9.0* 9.0* 8.5* 8.8*  HCT 33.5*   < > 30.1* 27.3* 28.5* 26.3* 26.8*  MCV 100.3*  --   --  99.3 101.4* 100.4* 99.3  PLT 322  --   --  260 297 287 309   < > = values in this interval not displayed.   Basic Metabolic Panel: Recent  Labs  Lab 11/15/19 0444 11/16/19 0319 11/17/19 0215 11/18/19 0204  NA 136 141 141 140  K 3.4* 4.4 3.8 3.9  CL 99 111 110 108  CO2 21* 21* 22 24  GLUCOSE 126* 98 99 103*  BUN 53* 29* 15 9  CREATININE 1.83* 1.35* 1.31* 1.14*  CALCIUM 9.0 8.5* 8.8* 8.9   GFR: Estimated Creatinine Clearance: 25.6 mL/min (A) (by C-G formula based on SCr of 1.14 mg/dL (H)). Liver Function Tests: Recent Labs  Lab 11/15/19 0444  AST 16  ALT 12  ALKPHOS 98  BILITOT 1.2  PROT 6.8  ALBUMIN 3.3*   No results for input(s): LIPASE, AMYLASE in the last 168 hours. No results for input(s): AMMONIA in the last 168 hours. Coagulation Profile: Recent Labs  Lab 11/15/19 0852  INR 1.1   Cardiac Enzymes: No results for input(s): CKTOTAL, CKMB, CKMBINDEX, TROPONINI in the last 168 hours. BNP (last 3 results) No results for input(s): PROBNP in the last 8760 hours. HbA1C: No results for input(s): HGBA1C in the last 72 hours. CBG: No results for input(s): GLUCAP in the last 168 hours. Lipid Profile: No results for input(s): CHOL, HDL, LDLCALC, TRIG, CHOLHDL, LDLDIRECT in the last 72 hours. Thyroid Function Tests: No results for input(s): TSH, T4TOTAL, FREET4, T3FREE, THYROIDAB in the last 72 hours. Anemia Panel: No results for input(s): VITAMINB12, FOLATE, FERRITIN, TIBC, IRON, RETICCTPCT in the last 72 hours. Sepsis Labs: No results for input(s): PROCALCITON, LATICACIDVEN in the last 168 hours.  Recent Results (from the past 240 hour(s))  SARS CORONAVIRUS 2 (TAT 6-24 HRS) Nasopharyngeal Nasopharyngeal Swab     Status: None   Collection Time: 11/15/19  6:18 AM   Specimen: Nasopharyngeal Swab  Result Value Ref Range Status   SARS Coronavirus 2 NEGATIVE NEGATIVE Final    Comment: (NOTE) SARS-CoV-2 target nucleic acids are NOT DETECTED. The SARS-CoV-2 RNA is generally detectable in upper and lower respiratory specimens during the acute phase of infection. Negative results do not preclude SARS-CoV-2  infection, do not rule out co-infections with other pathogens, and should not be used as the sole basis for treatment or other patient management decisions. Negative results must be combined with clinical observations, patient history, and epidemiological information. The expected result is Negative. Fact Sheet for Patients: SugarRoll.be Fact Sheet for Healthcare Providers: https://www.woods-mathews.com/ This test is not yet approved or cleared by the [place] FDA and  has been authorized for detection and/or diagnosis of SARS-CoV-2 by FDA under an Emergency Use Authorization (EUA). This EUA will remain  in effect (meaning this test can be used) for the duration of the COVID-19 declaration under Section 56 4(b)(1) of the Act, 21 U.S.C. section 360bbb-3(b)(1), unless the authorization is terminated or revoked sooner. Performed at [facility], [place] [address], [place], [place] [address]          Radiology Studies: No results  found.      Scheduled Meds: . allopurinol  100 mg Oral Daily  . amLODipine  5 mg Oral Daily  . pantoprazole  40 mg Oral Q1200  . polyethylene glycol  17 g Oral Daily  . sodium chloride flush  3 mL Intravenous Q12H  . zolpidem  2.5 mg Oral QHS   Continuous Infusions:   LOS: 2 days    Time spent: 35 minutes    Elfego Giammarino A Shirrell Solinger, MD [facility]   If 7PM-7AM, please contact night-coverage [email or web address] Password [facility] 11/18/2019, 4:34 PM

## 2019-11-18 NOTE — Progress Notes (Addendum)
[facility] PROGRESS NOTE Subjective Patient doing well, tolerating full liquids no bleeding hemoglobin stable only complaint is some cramping when she tries to have a bowel movement.  Objective: Vital signs in last 24 hours: Temp:  [98.6 F (37 C)-99.2 F (37.3 C)] 99.2 F (37.3 C) (11/18 0555) Pulse Rate:  [60-63] 60 (11/18 0555) Resp:  [17-18] 18 (11/18 0555) BP: (125-151)/(54-66) 151/66 (11/18 0555) SpO2:  [98 %-100 %] 100 % (11/18 0555) Last BM Date: 11/17/19  Intake/Output from previous day: 11/17 0701 - 11/18 0700 In: 580 [P.O.:580] Out: 1 [Urine:1] Intake/Output this shift: No intake/output data recorded.  PE: General--sitting up in bed eating a full liquid breakfast Abdomen--nondistended soft and nontender  Lab Results: Recent Labs    11/15/19 1534 11/16/19 0319 11/16/19 1638 11/17/19 0215 11/18/19 0204  WBC  --  7.5 7.8 7.1 6.1  HGB 9.7* 9.0* 9.0* 8.5* 8.8*  HCT 30.1* 27.3* 28.5* 26.3* 26.8*  PLT  --  260 297 287 309   BMET Recent Labs    11/16/19 0319 11/17/19 0215 11/18/19 0204  NA 141 141 140  K 4.4 3.8 3.9  CL 111 110 108  CO2 21* 22 24  CREATININE 1.35* 1.31* 1.14*   LFT No results for input(s): PROT, AST, ALT, ALKPHOS, BILITOT, BILIDIR, IBILI in the last 72 hours. PT/INR No results for input(s): LABPROT, INR in the last 72 hours. PANCREAS No results for input(s): LIPASE in the last 72 hours.       Studies/Results: No results found.  Medications: I have reviewed the patient's current medications.  Assessment:   1.  GI bleeding.  This seems to have resolved.  Hemoglobin is stable.  Suspect this could have been ischemic since she still having some cramping but is tolerating full liquids.   Plan: Would watch her 1 more day to make sure there is no more bleeding.  We will cut the MiraLAX down to once a day and would keep her on MiraLAX on discharge.  Would have her slowly advance to low residue diet and follow-up with Dr.  Michail Sermon in the office in about 2 weeks.  If she is still having symptoms of bleeding will rediscuss colonoscopy.  If she is doing well in the morning she could be discharged.  Nancy Fetter 11/18/2019, 9:04 AM  This note was created using voice recognition software. Minor errors may Have occurred unintentionally.  Pager: [phone] If no answer or after hours call [phone]

## 2019-11-19 LAB — CBC
HCT: 26.1 % — ABNORMAL LOW (ref 36.0–46.0)
Hemoglobin: 8.5 g/dL — ABNORMAL LOW (ref 12.0–15.0)
MCH: 32.7 pg (ref 26.0–34.0)
MCHC: 32.6 g/dL (ref 30.0–36.0)
MCV: 100.4 fL — ABNORMAL HIGH (ref 80.0–100.0)
Platelets: 324 10*3/uL (ref 150–400)
RBC: 2.6 MIL/uL — ABNORMAL LOW (ref 3.87–5.11)
RDW: 12.9 % (ref 11.5–15.5)
WBC: 5.5 10*3/uL (ref 4.0–10.5)
nRBC: 0 % (ref 0.0–0.2)

## 2019-11-19 LAB — BPAM RBC
Blood Product Expiration Date: 202012212359
Blood Product Expiration Date: 202012212359
Unit Type and Rh: 5100
Unit Type and Rh: 5100

## 2019-11-19 LAB — TYPE AND SCREEN
ABO/RH(D): O POS
Antibody Screen: POSITIVE
PT AG Type: NEGATIVE
Unit division: 0
Unit division: 0

## 2019-11-19 MED ORDER — PANTOPRAZOLE SODIUM 40 MG PO TBEC: 40.0000 mg | DELAYED_RELEASE_TABLET | Freq: Every day | ORAL | 0 refills | Status: AC

## 2019-11-19 MED ORDER — POLYETHYLENE GLYCOL 3350 17 G PO PACK: 17.0000 g | PACK | Freq: Every day | ORAL | 0 refills | Status: DC

## 2019-11-19 MED ORDER — AMLODIPINE BESYLATE 5 MG PO TABS: 5.0000 mg | ORAL_TABLET | Freq: Every day | ORAL | 0 refills | Status: DC

## 2019-11-19 NOTE — Progress Notes (Signed)
Patient discharged to home. Verbalizes understanding of all discharge instructions including discharge medications and follow up MD visits. Patient waiting ride.

## 2019-11-19 NOTE — Discharge Summary (Signed)
Physician Discharge Summary  April Burns OVF:[id number] DOB: Mar 16, 1927 DOA: 11/15/2019  PCP: [name], MD  Admit date: 11/15/2019 Discharge date: 11/19/2019  Admitted From: Home  Disposition: Home   Recommendations for Outpatient Follow-up:  1. Follow up with PCP in 1-2 weeks 2. Please obtain BMP/CBC in one week 3. Monitor hemoglobin if not significantly improved consider oral iron supplement.  4. Monitor BP, if increasing consider resumption of ARB  Home Health: none  Discharge Condition; Stable.  CODE STATUS: full Code Diet recommendation: Heart Healthy   Brief/Interim Summary: 85 year old with past medical history significant for hypertension, hyperlipidemia, pulmonary nodule status post biopsy seem to be noncancerous, arthritis who presents with complaints of blood in the stool. She also reports cramping abdominal pain with episode of nonbloody emesis.  She reports 3 days of bloody stool. Was admitted for further evaluation of GI bleed.  1-lower GI bleed, acute blood loss anemia CT abdomen and pelvis showed mild bowel wall thickening/soft tissue stranding loop of the ileum consistent with infectious versus inflammatory enteritis, chronic diverticulosis. Secondary to ischemic colitis versus diverticular bleed Hemoglobin has remained stable at 8.8 today. Continue with PPI. She has been able to tolerate full liquid diet. Plan to discharge home.  Needs to follow up with Dr Michail Sermon.   2-AKI superimposed on chronic kidney disease a stage III On admission creatinine was 1.8, baseline 1.4 Improved.  Hold nephrotoxins  Hypertension; Continue with Norvasc.  Hold Lasix due to AKI.  Holding clonidine due to GI bleed.  Hypokalemia: Resolved History of lung nodules: Follow with PCP   Discharge Diagnoses:  Principal Problem:   Upper GI bleed Active Problems:   Hypercholesterolemia   HTN (hypertension)   Osteopenia   Acute kidney injury superimposed on CKD  (HCC)   Hypokalemia   Acute blood loss anemia    Discharge Instructions  Discharge Instructions    Diet full liquid   Complete by: As directed    Increase activity slowly   Complete by: As directed      Allergies as of 11/19/2019      Reactions   Neosporin [neomycin-polymyxin-gramicidin] Dermatitis   Statins Other (See Comments)   Myalgias and unsteady gait      Medication List    STOP taking these medications   amLODipine-olmesartan 10-40 MG tablet Commonly known as: AZOR   aspirin 81 MG tablet   cloNIDine 0.1 MG tablet Commonly known as: CATAPRES     TAKE these medications   acetaminophen 325 MG tablet Commonly known as: TYLENOL Take 650 mg by mouth every 6 (six) hours as needed. For pain   allopurinol 100 MG tablet Commonly known as: ZYLOPRIM Take 100 mg by mouth daily.   amLODipine 5 MG tablet Commonly known as: NORVASC Take 1 tablet (5 mg total) by mouth daily.   calcium-vitamin D 500-200 MG-UNIT tablet Commonly known as: OSCAL WITH D Take 1 tablet by mouth 2 (two) times daily.   EpiPen 2-Pak 0.3 mg/0.3 mL Devi Generic drug: EPINEPHrine Inject 0.3 mg into the muscle as needed (for allergic reaction).   fluticasone 50 MCG/ACT nasal spray Commonly known as: FLONASE Place 2 sprays into the nose as needed for allergies.   ibandronate 150 MG tablet Commonly known as: BONIVA Take 150 mg by mouth every 30 (thirty) days.   Lasix 20 MG tablet Generic drug: furosemide Take 20 mg by mouth daily as needed for fluid.   pantoprazole 40 MG tablet Commonly known as: PROTONIX Take 1 tablet (40 mg total)  by mouth daily at 12 noon.   polyethylene glycol 17 g packet Commonly known as: MIRALAX / GLYCOLAX Take 17 g by mouth daily.   zolpidem 5 MG tablet Commonly known as: AMBIEN Take 2.5 mg by mouth at bedtime.       Allergies  Allergen Reactions  . Neosporin [Neomycin-Polymyxin-Gramicidin] Dermatitis  . Statins Other (See Comments)    Myalgias and  unsteady gait    Consultations:  GI   Procedures/Studies: Ct Abdomen Pelvis Wo Contrast  Result Date: 11/15/2019 CLINICAL DATA:  Crampy abdominal pain for the past week with large amount of blood in the stool three days ago and a small amount today. Previous lung surgery for fungal infection. EXAM: CT ABDOMEN AND PELVIS WITHOUT CONTRAST TECHNIQUE: Multidetector CT imaging of the abdomen and pelvis was performed following the standard protocol without IV contrast. COMPARISON:  Chest CT dated 10/21/2014. Abdomen and pelvis CT dated 01/08/2012. FINDINGS: Lower chest: Stable post wedge resection changes in the right lower lobe. Clear left lung base. Normal sized heart. Hepatobiliary: No focal liver abnormality is seen. No gallstones, gallbladder wall thickening, or biliary dilatation. Pancreas: Unremarkable. No pancreatic ductal dilatation or surrounding inflammatory changes. Spleen: Normal in size without focal abnormality. Adrenals/Urinary Tract: Adrenal glands are unremarkable. Kidneys are normal, without renal calculi, focal lesion, or hydronephrosis. Bladder is unremarkable. Stomach/Bowel: Multiple diverticula throughout the colon. No evidence of diverticulitis. Mildly prominent stool. No visible mass. Mild bowel wall thickening and adjacent soft tissue stranding involving a loop of ileum adjacent to the distended sigmoid colon filled with stool and gas in the central pelvis. No pneumatosis. No evidence of appendicitis. Unremarkable stomach. Vascular/Lymphatic: Atheromatous arterial calcifications without aneurysm. No enlarged lymph nodes. Reproductive: Status post hysterectomy. No adnexal masses. Other: No abdominal wall hernia or abnormality. Small amount of free peritoneal fluid in the pelvis. Musculoskeletal: Thoracic and lower lumbar spine degenerative changes and scoliosis. IMPRESSION: 1. Mild bowel wall thickening and adjacent soft tissue stranding involving a loop of ileum in the central pelvis.  This could be due to an infectious or inflammatory enteritis. Ischemic enteritis is less likely. 2. Extensive colonic diverticulosis. 3. Small amount of free peritoneal fluid in the pelvis. Electronically Signed   By: Claudie Revering M.D.   On: 11/15/2019 12:18   Dg Chest Port 1 View  Result Date: 11/15/2019 CLINICAL DATA:  Bloody stools. EXAM: PORTABLE CHEST 1 VIEW COMPARISON:  10/19/16 FINDINGS: The heart size and mediastinal contours are within normal limits. Aortic atherosclerosis. Mild chronic interstitial coarsening. Both lungs are clear. The visualized skeletal structures are unremarkable. IMPRESSION: No active disease. Electronically Signed   By: Kerby Moors M.D.   On: 11/15/2019 08:11      Subjective: Had small bowel movement. No significant abdominal pain  Discharge Exam: Vitals:   11/18/19 2050 11/19/19 0653  BP: (!) 129/59 121/60  Pulse: 61 68  Resp: 18 18  Temp: 98.6 F (37 C) 98.1 F (36.7 C)  SpO2: 100% 99%     General: Pt is alert, awake, not in acute distress Cardiovascular: RRR, S1/S2 +, no rubs, no gallops Respiratory: CTA bilaterally, no wheezing, no rhonchi Abdominal: Soft, NT, ND, bowel sounds + Extremities: no edema, no cyanosis    The results of significant diagnostics from this hospitalization (including imaging, microbiology, ancillary and laboratory) are listed below for reference.     Microbiology: Recent Results (from the past 240 hour(s))  SARS CORONAVIRUS 2 (TAT 6-24 HRS) Nasopharyngeal Nasopharyngeal Swab     Status: None  Collection Time: 11/15/19  6:18 AM   Specimen: Nasopharyngeal Swab  Result Value Ref Range Status   SARS Coronavirus 2 NEGATIVE NEGATIVE Final    Comment: (NOTE) SARS-CoV-2 target nucleic acids are NOT DETECTED. The SARS-CoV-2 RNA is generally detectable in upper and lower respiratory specimens during the acute phase of infection. Negative results do not preclude SARS-CoV-2 infection, do not rule out co-infections  with other pathogens, and should not be used as the sole basis for treatment or other patient management decisions. Negative results must be combined with clinical observations, patient history, and epidemiological information. The expected result is Negative. Fact Sheet for Patients: SugarRoll.be Fact Sheet for Healthcare Providers: https://www.woods-mathews.com/ This test is not yet approved or cleared by the [place] FDA and  has been authorized for detection and/or diagnosis of SARS-CoV-2 by FDA under an Emergency Use Authorization (EUA). This EUA will remain  in effect (meaning this test can be used) for the duration of the COVID-19 declaration under Section 56 4(b)(1) of the Act, 21 U.S.C. section 360bbb-3(b)(1), unless the authorization is terminated or revoked sooner. Performed at [facility], [place] [address], [place], [place] [address]      Labs: BNP (last 3 results) No results for input(s): BNP in the last 8760 hours. Basic Metabolic Panel: Recent Labs  Lab 11/15/19 0444 11/16/19 0319 11/17/19 0215 11/18/19 0204  NA 136 141 141 140  K 3.4* 4.4 3.8 3.9  CL 99 111 110 108  CO2 21* 21* 22 24  GLUCOSE 126* 98 99 103*  BUN 53* 29* 15 9  CREATININE 1.83* 1.35* 1.31* 1.14*  CALCIUM 9.0 8.5* 8.8* 8.9   Liver Function Tests: Recent Labs  Lab 11/15/19 0444  AST 16  ALT 12  ALKPHOS 98  BILITOT 1.2  PROT 6.8  ALBUMIN 3.3*   No results for input(s): LIPASE, AMYLASE in the last 168 hours. No results for input(s): AMMONIA in the last 168 hours. CBC: Recent Labs  Lab 11/15/19 0444  11/16/19 0319 11/16/19 1638 11/17/19 0215 11/18/19 0204 11/19/19 0208  WBC 10.0  --  7.5 7.8 7.1 6.1 5.5  NEUTROABS 8.0*  --   --   --   --   --   --   HGB 10.8*   < > 9.0* 9.0* 8.5* 8.8* 8.5*  HCT 33.5*   < > 27.3* 28.5* 26.3* 26.8* 26.1*  MCV 100.3*  --  99.3 101.4* 100.4* 99.3 100.4*  PLT 322  --  260 297 287 309 324   < >  = values in this interval not displayed.   Cardiac Enzymes: No results for input(s): CKTOTAL, CKMB, CKMBINDEX, TROPONINI in the last 168 hours. BNP: Invalid input(s): POCBNP CBG: No results for input(s): GLUCAP in the last 168 hours. D-Dimer No results for input(s): DDIMER in the last 72 hours. Hgb A1c No results for input(s): HGBA1C in the last 72 hours. Lipid Profile No results for input(s): CHOL, HDL, LDLCALC, TRIG, CHOLHDL, LDLDIRECT in the last 72 hours. Thyroid function studies No results for input(s): TSH, T4TOTAL, T3FREE, THYROIDAB in the last 72 hours.  Invalid input(s): FREET3 Anemia work up No results for input(s): VITAMINB12, FOLATE, FERRITIN, TIBC, IRON, RETICCTPCT in the last 72 hours. Urinalysis    Component Value Date/Time   COLORURINE STRAW (A) 10/29/2018 [place] 10/29/2018 0834   LABSPEC 1.009 10/29/2018 0834   PHURINE 6.0 10/29/2018 0834   GLUCOSEU NEGATIVE 10/29/2018 0834   HGBUR SMALL (A) 10/29/2018 5643  BILIRUBINUR NEGATIVE 10/29/2018 [place] 10/29/2018 0834   PROTEINUR NEGATIVE 10/29/2018 0834   UROBILINOGEN 0.2 02/18/2012 0844   NITRITE NEGATIVE 10/29/2018 0834   LEUKOCYTESUR NEGATIVE 10/29/2018 0834   Sepsis Labs Invalid input(s): PROCALCITONIN,  WBC,  LACTICIDVEN Microbiology Recent Results (from the past 240 hour(s))  SARS CORONAVIRUS 2 (TAT 6-24 HRS) Nasopharyngeal Nasopharyngeal Swab     Status: None   Collection Time: 11/15/19  6:18 AM   Specimen: Nasopharyngeal Swab  Result Value Ref Range Status   SARS Coronavirus 2 NEGATIVE NEGATIVE Final    Comment: (NOTE) SARS-CoV-2 target nucleic acids are NOT DETECTED. The SARS-CoV-2 RNA is generally detectable in upper and lower respiratory specimens during the acute phase of infection. Negative results do not preclude SARS-CoV-2 infection, do not rule out co-infections with other pathogens, and should not be used as the sole basis for treatment or other  patient management decisions. Negative results must be combined with clinical observations, patient history, and epidemiological information. The expected result is Negative. Fact Sheet for Patients: SugarRoll.be Fact Sheet for Healthcare Providers: https://www.woods-mathews.com/ This test is not yet approved or cleared by the [place] FDA and  has been authorized for detection and/or diagnosis of SARS-CoV-2 by FDA under an Emergency Use Authorization (EUA). This EUA will remain  in effect (meaning this test can be used) for the duration of the COVID-19 declaration under Section 56 4(b)(1) of the Act, 21 U.S.C. section 360bbb-3(b)(1), unless the authorization is terminated or revoked sooner. Performed at [facility], [place] [address], [place], [place] [address]      Time coordinating discharge: 40 minutes  SIGNED:   Elmarie Shiley, MD  [facility]

## 2019-11-19 NOTE — Progress Notes (Signed)
[facility] PROGRESS NOTE Subjective Tolerating diet stools loose no bleeding and abdominal pain resolved.  Objective: Vital signs in last 24 hours: Temp:  [98.1 F (36.7 C)-98.9 F (37.2 C)] 98.1 F (36.7 C) (11/19 0653) Pulse Rate:  [57-68] 68 (11/19 0653) Resp:  [18] 18 (11/19 0653) BP: (120-129)/(59-65) 121/60 (11/19 0653) SpO2:  [99 %-100 %] 99 % (11/19 0653) Last BM Date: 11/19/19  Intake/Output from previous day: 11/18 0701 - 11/19 0700 In: 300 [P.O.:300] Out: -  Intake/Output this shift: Total I/O In: 520 [P.O.:520] Out: -   PE: General--watching television no distress Abdomen--soft and nontender  Lab Results: Recent Labs    11/16/19 1638 11/17/19 0215 11/18/19 0204 11/19/19 0208  WBC 7.8 7.1 6.1 5.5  HGB 9.0* 8.5* 8.8* 8.5*  HCT 28.5* 26.3* 26.8* 26.1*  PLT 297 287 309 324   BMET Recent Labs    11/17/19 0215 11/18/19 0204  NA 141 140  K 3.8 3.9  CL 110 108  CO2 22 24  CREATININE 1.31* 1.14*   LFT No results for input(s): PROT, AST, ALT, ALKPHOS, BILITOT, BILIDIR, IBILI in the last 72 hours. PT/INR No results for input(s): LABPROT, INR in the last 72 hours. PANCREAS No results for input(s): LIPASE in the last 72 hours.       Studies/Results: No results found.  Medications: I have reviewed the patient's current medications.  Assessment:   1.  GI bleeding/abdominal pain.  Not clear if this was diverticular or ischemic but she seems to be resolving.  She does not wish to have a colonoscopy unless absolutely necessary.  Tolerating full liquids without pain.  Hemoglobin basically stable.   Plan: 1.  Would discharge her on full liquids/low residue diet with slow advancement.  Have discussed this with her.  Would also make sure that she takes MiraLAX to keep her stools soft and have discussed this with her as well. 2.  Would have her follow-up with Dr. [name] next week to have her hemoglobin checked.  Would have her see Dr.  Michail Sermon in 2 weeks to see how she is doing.  If she is not completely back to normal suspect we would readdress colonoscopy and she said she may consider them.  We will sign off but let us know if something changes.  I think she could be discharged.   April Burns 11/19/2019, 9:59 AM  This note was created using voice recognition software. Minor errors may Have occurred unintentionally.  Pager: [phone] If no answer or after hours call [phone]

## 2019-11-19 NOTE — Plan of Care (Signed)
Nutrition Education Note  RD consulted for nutrition diet education regarding low residue diet.   RD provided "Fiber Restricted Nutrition Therapy" handout from the Academy of Nutrition and Dietetics. Reviewed patient's dietary recall and discussed ways for pt to meet nutrition goals over the next several weeks. Reviewed low fiber foods and provided list of foods recommended and not recommended. Recommended MD follow up for safe transition to regular diet. Teach back method used. Pt verbalizes understanding of information provided.   Expect good compliance.  Body mass index is Body mass index is 19.94 kg/m. Pt meets criteria for normal based on current BMI.  Current diet order is full liquid/low residue diet, patient is consuming approximately 75% of meals at this time. Labs and medications reviewed. No further nutrition interventions warranted at this time. RD contact information provided. If additional nutrition issues arise, please re-consult RD. Plans to discharge home today.  Corrin Parker, MS, RD, LDN Pager # [phone] After hours/ weekend pager # [phone]

## 2019-12-02 DIAGNOSIS — K922 Gastrointestinal hemorrhage, unspecified: Secondary | ICD-10-CM | POA: Diagnosis not present

## 2019-12-02 DIAGNOSIS — I10 Essential (primary) hypertension: Secondary | ICD-10-CM | POA: Diagnosis not present

## 2019-12-02 DIAGNOSIS — M810 Age-related osteoporosis without current pathological fracture: Secondary | ICD-10-CM | POA: Diagnosis not present

## 2019-12-02 DIAGNOSIS — G4700 Insomnia, unspecified: Secondary | ICD-10-CM | POA: Diagnosis not present

## 2020-01-12 ENCOUNTER — Other Ambulatory Visit: Payer: Self-pay | Admitting: Gastroenterology

## 2020-01-12 DIAGNOSIS — K529 Noninfective gastroenteritis and colitis, unspecified: Secondary | ICD-10-CM | POA: Diagnosis not present

## 2020-01-12 DIAGNOSIS — M109 Gout, unspecified: Secondary | ICD-10-CM | POA: Diagnosis not present

## 2020-01-12 DIAGNOSIS — K5790 Diverticulosis of intestine, part unspecified, without perforation or abscess without bleeding: Secondary | ICD-10-CM | POA: Diagnosis not present

## 2020-01-12 DIAGNOSIS — I10 Essential (primary) hypertension: Secondary | ICD-10-CM | POA: Diagnosis not present

## 2020-01-21 ENCOUNTER — Ambulatory Visit
Admission: RE | Admit: 2020-01-21 | Discharge: 2020-01-21 | Disposition: A | Discharge status: Home / Self Care | Payer: Medicare Other | Source: Ambulatory Visit | Attending: Gastroenterology | Admitting: Gastroenterology

## 2020-01-21 DIAGNOSIS — K5730 Diverticulosis of large intestine without perforation or abscess without bleeding: Secondary | ICD-10-CM | POA: Diagnosis not present

## 2020-01-21 DIAGNOSIS — K529 Noninfective gastroenteritis and colitis, unspecified: Secondary | ICD-10-CM

## 2020-01-24 ENCOUNTER — Ambulatory Visit: Payer: Medicare Other | Attending: Internal Medicine

## 2020-01-24 DIAGNOSIS — Z23 Encounter for immunization: Secondary | ICD-10-CM | POA: Insufficient documentation

## 2020-01-24 NOTE — Progress Notes (Signed)
   Covid-19 Vaccination Clinic  Name:  April Burns    MRN: [id number] DOB: Jul 14, 1927  01/24/2020  April Burns was observed post Covid-19 immunization for 15 minutes without incidence. She was provided with Vaccine Information Sheet and instruction to access the V-Safe system.   April Burns was instructed to call 911 with any severe reactions post vaccine: Marland Kitchen Difficulty breathing  . Swelling of your face and throat  . A fast heartbeat  . A bad rash all over your body  . Dizziness and weakness    Immunizations Administered    Name Date Dose VIS Date Route   Pfizer COVID-19 Vaccine 01/24/2020 11:09 AM 0.3 mL 12/11/2019 Intramuscular   Manufacturer: [place]   Lot: [id number]   [place]: [id number]

## 2020-02-02 DIAGNOSIS — I10 Essential (primary) hypertension: Secondary | ICD-10-CM | POA: Diagnosis not present

## 2020-02-02 DIAGNOSIS — M109 Gout, unspecified: Secondary | ICD-10-CM | POA: Diagnosis not present

## 2020-02-02 DIAGNOSIS — K5790 Diverticulosis of intestine, part unspecified, without perforation or abscess without bleeding: Secondary | ICD-10-CM | POA: Diagnosis not present

## 2020-02-02 DIAGNOSIS — K660 Peritoneal adhesions (postprocedural) (postinfection): Secondary | ICD-10-CM | POA: Diagnosis not present

## 2020-02-15 ENCOUNTER — Ambulatory Visit: Payer: Medicare Other | Attending: Internal Medicine

## 2020-02-15 DIAGNOSIS — Z23 Encounter for immunization: Secondary | ICD-10-CM | POA: Insufficient documentation

## 2020-02-15 NOTE — Progress Notes (Signed)
   Covid-19 Vaccination Clinic  Name:  April Burns    MRN: [id number] DOB: Jun 27, 1927  02/15/2020  [name] was observed post Covid-19 immunization for 15 minutes without incidence. She was provided with Vaccine Information Sheet and instruction to access the V-Safe system.   [name] was instructed to call 911 with any severe reactions post vaccine: Marland Kitchen Difficulty breathing  . Swelling of your face and throat  . A fast heartbeat  . A bad rash all over your body  . Dizziness and weakness    Immunizations Administered    Name Date Dose VIS Date Route   Pfizer COVID-19 Vaccine 02/15/2020 11:02 AM 0.3 mL 12/11/2019 Intramuscular   Manufacturer: [place]   Lot: [id number]   [place]: [id number]

## 2020-04-06 DIAGNOSIS — I10 Essential (primary) hypertension: Secondary | ICD-10-CM | POA: Diagnosis not present

## 2020-04-21 DIAGNOSIS — H903 Sensorineural hearing loss, bilateral: Secondary | ICD-10-CM | POA: Diagnosis not present

## 2020-05-10 DIAGNOSIS — Z85828 Personal history of other malignant neoplasm of skin: Secondary | ICD-10-CM | POA: Diagnosis not present

## 2020-05-10 DIAGNOSIS — D2272 Melanocytic nevi of left lower limb, including hip: Secondary | ICD-10-CM | POA: Diagnosis not present

## 2020-05-10 DIAGNOSIS — D2271 Melanocytic nevi of right lower limb, including hip: Secondary | ICD-10-CM | POA: Diagnosis not present

## 2020-05-10 DIAGNOSIS — D2239 Melanocytic nevi of other parts of face: Secondary | ICD-10-CM | POA: Diagnosis not present

## 2020-05-10 DIAGNOSIS — D692 Other nonthrombocytopenic purpura: Secondary | ICD-10-CM | POA: Diagnosis not present

## 2020-05-10 DIAGNOSIS — L821 Other seborrheic keratosis: Secondary | ICD-10-CM | POA: Diagnosis not present

## 2020-05-10 DIAGNOSIS — Z85820 Personal history of malignant melanoma of skin: Secondary | ICD-10-CM | POA: Diagnosis not present

## 2020-05-10 DIAGNOSIS — L814 Other melanin hyperpigmentation: Secondary | ICD-10-CM | POA: Diagnosis not present

## 2020-05-10 DIAGNOSIS — D225 Melanocytic nevi of trunk: Secondary | ICD-10-CM | POA: Diagnosis not present

## 2020-05-26 DIAGNOSIS — H353131 Nonexudative age-related macular degeneration, bilateral, early dry stage: Secondary | ICD-10-CM | POA: Diagnosis not present

## 2020-05-26 DIAGNOSIS — H52203 Unspecified astigmatism, bilateral: Secondary | ICD-10-CM | POA: Diagnosis not present

## 2020-05-26 DIAGNOSIS — H43813 Vitreous degeneration, bilateral: Secondary | ICD-10-CM | POA: Diagnosis not present

## 2020-05-26 DIAGNOSIS — Z961 Presence of intraocular lens: Secondary | ICD-10-CM | POA: Diagnosis not present

## 2020-06-03 DIAGNOSIS — Z1389 Encounter for screening for other disorder: Secondary | ICD-10-CM | POA: Diagnosis not present

## 2020-06-03 DIAGNOSIS — Z0000 Encounter for general adult medical examination without abnormal findings: Secondary | ICD-10-CM | POA: Diagnosis not present

## 2020-06-06 DIAGNOSIS — R519 Headache, unspecified: Secondary | ICD-10-CM | POA: Diagnosis not present

## 2020-09-27 DIAGNOSIS — M109 Gout, unspecified: Secondary | ICD-10-CM | POA: Diagnosis not present

## 2020-09-27 DIAGNOSIS — I10 Essential (primary) hypertension: Secondary | ICD-10-CM | POA: Diagnosis not present

## 2020-09-27 DIAGNOSIS — Z23 Encounter for immunization: Secondary | ICD-10-CM | POA: Diagnosis not present

## 2020-09-27 DIAGNOSIS — R609 Edema, unspecified: Secondary | ICD-10-CM | POA: Diagnosis not present

## 2020-10-18 DIAGNOSIS — I10 Essential (primary) hypertension: Secondary | ICD-10-CM | POA: Diagnosis not present

## 2020-10-18 DIAGNOSIS — R519 Headache, unspecified: Secondary | ICD-10-CM | POA: Diagnosis not present

## 2020-10-18 DIAGNOSIS — M109 Gout, unspecified: Secondary | ICD-10-CM | POA: Diagnosis not present

## 2020-10-18 DIAGNOSIS — R609 Edema, unspecified: Secondary | ICD-10-CM | POA: Diagnosis not present

## 2020-12-06 DIAGNOSIS — H6121 Impacted cerumen, right ear: Secondary | ICD-10-CM | POA: Diagnosis not present

## 2021-02-16 DIAGNOSIS — Z961 Presence of intraocular lens: Secondary | ICD-10-CM | POA: Diagnosis not present

## 2021-02-16 DIAGNOSIS — H52203 Unspecified astigmatism, bilateral: Secondary | ICD-10-CM | POA: Diagnosis not present

## 2021-02-16 DIAGNOSIS — H43813 Vitreous degeneration, bilateral: Secondary | ICD-10-CM | POA: Diagnosis not present

## 2021-02-16 DIAGNOSIS — H353131 Nonexudative age-related macular degeneration, bilateral, early dry stage: Secondary | ICD-10-CM | POA: Diagnosis not present

## 2021-02-27 ENCOUNTER — Other Ambulatory Visit: Payer: Self-pay | Admitting: Internal Medicine

## 2021-02-27 ENCOUNTER — Ambulatory Visit
Admission: RE | Admit: 2021-02-27 | Discharge: 2021-02-27 | Disposition: A | Discharge status: Home / Self Care | Payer: Medicare Other | Source: Ambulatory Visit | Attending: Internal Medicine | Admitting: Internal Medicine

## 2021-02-27 DIAGNOSIS — M79671 Pain in right foot: Secondary | ICD-10-CM | POA: Diagnosis not present

## 2021-02-27 DIAGNOSIS — I10 Essential (primary) hypertension: Secondary | ICD-10-CM | POA: Diagnosis not present

## 2021-02-27 DIAGNOSIS — M7989 Other specified soft tissue disorders: Secondary | ICD-10-CM | POA: Diagnosis not present

## 2021-02-27 DIAGNOSIS — M12871 Other specific arthropathies, not elsewhere classified, right ankle and foot: Secondary | ICD-10-CM | POA: Diagnosis not present

## 2021-02-27 DIAGNOSIS — Z85820 Personal history of malignant melanoma of skin: Secondary | ICD-10-CM | POA: Diagnosis not present

## 2021-02-27 DIAGNOSIS — M109 Gout, unspecified: Secondary | ICD-10-CM | POA: Diagnosis not present

## 2021-02-27 DIAGNOSIS — R040 Epistaxis: Secondary | ICD-10-CM | POA: Diagnosis not present

## 2021-02-27 DIAGNOSIS — R609 Edema, unspecified: Secondary | ICD-10-CM | POA: Diagnosis not present

## 2021-02-27 DIAGNOSIS — M19071 Primary osteoarthritis, right ankle and foot: Secondary | ICD-10-CM | POA: Diagnosis not present

## 2021-04-12 DIAGNOSIS — M25571 Pain in right ankle and joints of right foot: Secondary | ICD-10-CM | POA: Diagnosis not present

## 2021-04-12 DIAGNOSIS — R2689 Other abnormalities of gait and mobility: Secondary | ICD-10-CM | POA: Diagnosis not present

## 2021-04-17 DIAGNOSIS — M76829 Posterior tibial tendinitis, unspecified leg: Secondary | ICD-10-CM | POA: Diagnosis not present

## 2021-04-17 DIAGNOSIS — M25571 Pain in right ankle and joints of right foot: Secondary | ICD-10-CM | POA: Diagnosis not present

## 2021-04-19 ENCOUNTER — Ambulatory Visit: Payer: Medicare Other | Attending: Internal Medicine

## 2021-04-19 DIAGNOSIS — Z23 Encounter for immunization: Secondary | ICD-10-CM

## 2021-04-19 NOTE — Progress Notes (Signed)
   Covid-19 Vaccination Clinic  Name:  April Burns    MRN: [id number] DOB: [birth year]  04/19/2021  [name] was observed post Covid-19 immunization for 15 minutes without incident. She was provided with Vaccine Information Sheet and instruction to access the V-Safe system.   Ms. Older was instructed to call 911 with any severe reactions post vaccine: Marland Kitchen Difficulty breathing  . Swelling of face and throat  . A fast heartbeat  . A bad rash all over body  . Dizziness and weakness   Immunizations Administered    Name Date Dose VIS Date Route   PFIZER Comrnaty(Gray TOP) Covid-19 Vaccine 04/19/2021 10:04 AM 0.3 mL 12/08/2020 Intramuscular   Manufacturer: [employer]   Lot: [id number]   [place]: [phone]

## 2021-04-20 ENCOUNTER — Other Ambulatory Visit (HOSPITAL_COMMUNITY): Payer: Self-pay

## 2021-04-20 MED ORDER — COVID-19 MRNA VAC-TRIS(PFIZER) 30 MCG/0.3ML IM SUSP
INTRAMUSCULAR | 0 refills | Status: AC
  Filled 2021-04-20: qty 0.3, 17d supply, fill #0

## 2021-04-25 DIAGNOSIS — M25571 Pain in right ankle and joints of right foot: Secondary | ICD-10-CM | POA: Diagnosis not present

## 2021-04-25 DIAGNOSIS — M76829 Posterior tibial tendinitis, unspecified leg: Secondary | ICD-10-CM | POA: Diagnosis not present

## 2021-04-27 DIAGNOSIS — Z85820 Personal history of malignant melanoma of skin: Secondary | ICD-10-CM | POA: Diagnosis not present

## 2021-04-27 DIAGNOSIS — L821 Other seborrheic keratosis: Secondary | ICD-10-CM | POA: Diagnosis not present

## 2021-04-27 DIAGNOSIS — D2271 Melanocytic nevi of right lower limb, including hip: Secondary | ICD-10-CM | POA: Diagnosis not present

## 2021-04-27 DIAGNOSIS — Z85828 Personal history of other malignant neoplasm of skin: Secondary | ICD-10-CM | POA: Diagnosis not present

## 2021-04-27 DIAGNOSIS — D2272 Melanocytic nevi of left lower limb, including hip: Secondary | ICD-10-CM | POA: Diagnosis not present

## 2021-04-27 DIAGNOSIS — C4441 Basal cell carcinoma of skin of scalp and neck: Secondary | ICD-10-CM | POA: Diagnosis not present

## 2021-04-27 DIAGNOSIS — D225 Melanocytic nevi of trunk: Secondary | ICD-10-CM | POA: Diagnosis not present

## 2021-04-27 DIAGNOSIS — D485 Neoplasm of uncertain behavior of skin: Secondary | ICD-10-CM | POA: Diagnosis not present

## 2021-04-27 DIAGNOSIS — D692 Other nonthrombocytopenic purpura: Secondary | ICD-10-CM | POA: Diagnosis not present

## 2021-04-27 DIAGNOSIS — L814 Other melanin hyperpigmentation: Secondary | ICD-10-CM | POA: Diagnosis not present

## 2021-05-02 DIAGNOSIS — M76829 Posterior tibial tendinitis, unspecified leg: Secondary | ICD-10-CM | POA: Diagnosis not present

## 2021-05-02 DIAGNOSIS — M25571 Pain in right ankle and joints of right foot: Secondary | ICD-10-CM | POA: Diagnosis not present

## 2021-05-05 DIAGNOSIS — M25571 Pain in right ankle and joints of right foot: Secondary | ICD-10-CM | POA: Diagnosis not present

## 2021-05-05 DIAGNOSIS — M76829 Posterior tibial tendinitis, unspecified leg: Secondary | ICD-10-CM | POA: Diagnosis not present

## 2021-05-09 DIAGNOSIS — M25571 Pain in right ankle and joints of right foot: Secondary | ICD-10-CM | POA: Diagnosis not present

## 2021-05-09 DIAGNOSIS — M76829 Posterior tibial tendinitis, unspecified leg: Secondary | ICD-10-CM | POA: Diagnosis not present

## 2021-05-11 DIAGNOSIS — M25571 Pain in right ankle and joints of right foot: Secondary | ICD-10-CM | POA: Diagnosis not present

## 2021-05-11 DIAGNOSIS — M76829 Posterior tibial tendinitis, unspecified leg: Secondary | ICD-10-CM | POA: Diagnosis not present

## 2021-05-12 DIAGNOSIS — M5416 Radiculopathy, lumbar region: Secondary | ICD-10-CM | POA: Diagnosis not present

## 2021-05-12 DIAGNOSIS — R2681 Unsteadiness on feet: Secondary | ICD-10-CM | POA: Diagnosis not present

## 2021-05-16 DIAGNOSIS — C44319 Basal cell carcinoma of skin of other parts of face: Secondary | ICD-10-CM | POA: Diagnosis not present

## 2021-05-16 DIAGNOSIS — Z85820 Personal history of malignant melanoma of skin: Secondary | ICD-10-CM | POA: Diagnosis not present

## 2021-05-16 DIAGNOSIS — Z85828 Personal history of other malignant neoplasm of skin: Secondary | ICD-10-CM | POA: Diagnosis not present

## 2021-05-25 DIAGNOSIS — D485 Neoplasm of uncertain behavior of skin: Secondary | ICD-10-CM | POA: Diagnosis not present

## 2021-05-25 DIAGNOSIS — Z85820 Personal history of malignant melanoma of skin: Secondary | ICD-10-CM | POA: Diagnosis not present

## 2021-05-25 DIAGNOSIS — Z85828 Personal history of other malignant neoplasm of skin: Secondary | ICD-10-CM | POA: Diagnosis not present

## 2021-05-25 DIAGNOSIS — L988 Other specified disorders of the skin and subcutaneous tissue: Secondary | ICD-10-CM | POA: Diagnosis not present

## 2021-06-01 DIAGNOSIS — M5416 Radiculopathy, lumbar region: Secondary | ICD-10-CM | POA: Diagnosis not present

## 2021-06-05 DIAGNOSIS — Z1389 Encounter for screening for other disorder: Secondary | ICD-10-CM | POA: Diagnosis not present

## 2021-06-05 DIAGNOSIS — M109 Gout, unspecified: Secondary | ICD-10-CM | POA: Diagnosis not present

## 2021-06-05 DIAGNOSIS — Z85820 Personal history of malignant melanoma of skin: Secondary | ICD-10-CM | POA: Diagnosis not present

## 2021-06-05 DIAGNOSIS — E782 Mixed hyperlipidemia: Secondary | ICD-10-CM | POA: Diagnosis not present

## 2021-06-05 DIAGNOSIS — N183 Chronic kidney disease, stage 3 unspecified: Secondary | ICD-10-CM | POA: Diagnosis not present

## 2021-06-05 DIAGNOSIS — R609 Edema, unspecified: Secondary | ICD-10-CM | POA: Diagnosis not present

## 2021-06-05 DIAGNOSIS — M79671 Pain in right foot: Secondary | ICD-10-CM | POA: Diagnosis not present

## 2021-06-05 DIAGNOSIS — Z0000 Encounter for general adult medical examination without abnormal findings: Secondary | ICD-10-CM | POA: Diagnosis not present

## 2021-06-05 DIAGNOSIS — J441 Chronic obstructive pulmonary disease with (acute) exacerbation: Secondary | ICD-10-CM | POA: Diagnosis not present

## 2021-06-05 DIAGNOSIS — R5382 Chronic fatigue, unspecified: Secondary | ICD-10-CM | POA: Diagnosis not present

## 2021-06-05 DIAGNOSIS — F40243 Fear of flying: Secondary | ICD-10-CM | POA: Diagnosis not present

## 2021-06-05 DIAGNOSIS — E559 Vitamin D deficiency, unspecified: Secondary | ICD-10-CM | POA: Diagnosis not present

## 2021-06-05 DIAGNOSIS — I10 Essential (primary) hypertension: Secondary | ICD-10-CM | POA: Diagnosis not present

## 2021-06-05 DIAGNOSIS — E611 Iron deficiency: Secondary | ICD-10-CM | POA: Diagnosis not present

## 2021-06-06 DIAGNOSIS — I10 Essential (primary) hypertension: Secondary | ICD-10-CM | POA: Diagnosis not present

## 2021-06-08 DIAGNOSIS — M5416 Radiculopathy, lumbar region: Secondary | ICD-10-CM | POA: Diagnosis not present

## 2021-06-08 DIAGNOSIS — I700 Atherosclerosis of aorta: Secondary | ICD-10-CM | POA: Diagnosis not present

## 2021-06-23 DIAGNOSIS — M5441 Lumbago with sciatica, right side: Secondary | ICD-10-CM | POA: Diagnosis not present

## 2021-06-23 DIAGNOSIS — R2681 Unsteadiness on feet: Secondary | ICD-10-CM | POA: Diagnosis not present

## 2021-06-28 DIAGNOSIS — R2681 Unsteadiness on feet: Secondary | ICD-10-CM | POA: Diagnosis not present

## 2021-06-28 DIAGNOSIS — M5441 Lumbago with sciatica, right side: Secondary | ICD-10-CM | POA: Diagnosis not present

## 2021-08-03 DIAGNOSIS — R2681 Unsteadiness on feet: Secondary | ICD-10-CM | POA: Diagnosis not present

## 2021-08-03 DIAGNOSIS — M5441 Lumbago with sciatica, right side: Secondary | ICD-10-CM | POA: Diagnosis not present

## 2021-08-10 DIAGNOSIS — R2681 Unsteadiness on feet: Secondary | ICD-10-CM | POA: Diagnosis not present

## 2021-08-10 DIAGNOSIS — M5441 Lumbago with sciatica, right side: Secondary | ICD-10-CM | POA: Diagnosis not present

## 2021-08-17 DIAGNOSIS — R2681 Unsteadiness on feet: Secondary | ICD-10-CM | POA: Diagnosis not present

## 2021-08-17 DIAGNOSIS — M5441 Lumbago with sciatica, right side: Secondary | ICD-10-CM | POA: Diagnosis not present

## 2021-08-31 DIAGNOSIS — M5441 Lumbago with sciatica, right side: Secondary | ICD-10-CM | POA: Diagnosis not present

## 2021-08-31 DIAGNOSIS — R2681 Unsteadiness on feet: Secondary | ICD-10-CM | POA: Diagnosis not present

## 2021-09-07 DIAGNOSIS — R2681 Unsteadiness on feet: Secondary | ICD-10-CM | POA: Diagnosis not present

## 2021-09-07 DIAGNOSIS — M5441 Lumbago with sciatica, right side: Secondary | ICD-10-CM | POA: Diagnosis not present

## 2021-09-12 DIAGNOSIS — M5441 Lumbago with sciatica, right side: Secondary | ICD-10-CM | POA: Diagnosis not present

## 2021-09-12 DIAGNOSIS — R2681 Unsteadiness on feet: Secondary | ICD-10-CM | POA: Diagnosis not present

## 2021-09-19 DIAGNOSIS — Z961 Presence of intraocular lens: Secondary | ICD-10-CM | POA: Diagnosis not present

## 2021-09-19 DIAGNOSIS — R519 Headache, unspecified: Secondary | ICD-10-CM | POA: Diagnosis not present

## 2021-09-19 DIAGNOSIS — M5430 Sciatica, unspecified side: Secondary | ICD-10-CM | POA: Diagnosis not present

## 2021-09-19 DIAGNOSIS — I10 Essential (primary) hypertension: Secondary | ICD-10-CM | POA: Diagnosis not present

## 2021-09-19 DIAGNOSIS — H353131 Nonexudative age-related macular degeneration, bilateral, early dry stage: Secondary | ICD-10-CM | POA: Diagnosis not present

## 2021-09-21 DIAGNOSIS — M5416 Radiculopathy, lumbar region: Secondary | ICD-10-CM | POA: Diagnosis not present

## 2021-10-10 DIAGNOSIS — Z23 Encounter for immunization: Secondary | ICD-10-CM | POA: Diagnosis not present

## 2021-10-10 DIAGNOSIS — M5416 Radiculopathy, lumbar region: Secondary | ICD-10-CM | POA: Diagnosis not present

## 2021-10-17 DIAGNOSIS — M5416 Radiculopathy, lumbar region: Secondary | ICD-10-CM | POA: Diagnosis not present

## 2021-11-08 DIAGNOSIS — R2689 Other abnormalities of gait and mobility: Secondary | ICD-10-CM | POA: Diagnosis not present

## 2021-11-14 DIAGNOSIS — H353131 Nonexudative age-related macular degeneration, bilateral, early dry stage: Secondary | ICD-10-CM | POA: Diagnosis not present

## 2021-11-14 DIAGNOSIS — H04123 Dry eye syndrome of bilateral lacrimal glands: Secondary | ICD-10-CM | POA: Diagnosis not present

## 2021-11-14 DIAGNOSIS — Z961 Presence of intraocular lens: Secondary | ICD-10-CM | POA: Diagnosis not present

## 2021-11-15 DIAGNOSIS — R2689 Other abnormalities of gait and mobility: Secondary | ICD-10-CM | POA: Diagnosis not present

## 2021-11-17 DIAGNOSIS — R2689 Other abnormalities of gait and mobility: Secondary | ICD-10-CM | POA: Diagnosis not present

## 2021-11-21 DIAGNOSIS — R2689 Other abnormalities of gait and mobility: Secondary | ICD-10-CM | POA: Diagnosis not present

## 2021-12-01 DIAGNOSIS — R2689 Other abnormalities of gait and mobility: Secondary | ICD-10-CM | POA: Diagnosis not present

## 2021-12-05 DIAGNOSIS — R2689 Other abnormalities of gait and mobility: Secondary | ICD-10-CM | POA: Diagnosis not present

## 2021-12-08 DIAGNOSIS — I10 Essential (primary) hypertension: Secondary | ICD-10-CM | POA: Diagnosis not present

## 2021-12-15 DIAGNOSIS — R2689 Other abnormalities of gait and mobility: Secondary | ICD-10-CM | POA: Diagnosis not present

## 2021-12-19 DIAGNOSIS — R2689 Other abnormalities of gait and mobility: Secondary | ICD-10-CM | POA: Diagnosis not present

## 2021-12-22 DIAGNOSIS — R2689 Other abnormalities of gait and mobility: Secondary | ICD-10-CM | POA: Diagnosis not present

## 2022-01-02 DIAGNOSIS — Z85820 Personal history of malignant melanoma of skin: Secondary | ICD-10-CM | POA: Diagnosis not present

## 2022-01-02 DIAGNOSIS — D2272 Melanocytic nevi of left lower limb, including hip: Secondary | ICD-10-CM | POA: Diagnosis not present

## 2022-01-02 DIAGNOSIS — L821 Other seborrheic keratosis: Secondary | ICD-10-CM | POA: Diagnosis not present

## 2022-01-02 DIAGNOSIS — D225 Melanocytic nevi of trunk: Secondary | ICD-10-CM | POA: Diagnosis not present

## 2022-01-02 DIAGNOSIS — D2271 Melanocytic nevi of right lower limb, including hip: Secondary | ICD-10-CM | POA: Diagnosis not present

## 2022-01-02 DIAGNOSIS — L814 Other melanin hyperpigmentation: Secondary | ICD-10-CM | POA: Diagnosis not present

## 2022-01-02 DIAGNOSIS — Z85828 Personal history of other malignant neoplasm of skin: Secondary | ICD-10-CM | POA: Diagnosis not present

## 2022-01-03 DIAGNOSIS — I10 Essential (primary) hypertension: Secondary | ICD-10-CM | POA: Diagnosis not present

## 2022-01-03 DIAGNOSIS — R0789 Other chest pain: Secondary | ICD-10-CM | POA: Diagnosis not present

## 2022-01-04 DIAGNOSIS — I10 Essential (primary) hypertension: Secondary | ICD-10-CM | POA: Diagnosis not present

## 2022-01-09 DIAGNOSIS — I10 Essential (primary) hypertension: Secondary | ICD-10-CM | POA: Diagnosis not present

## 2022-01-09 DIAGNOSIS — R0789 Other chest pain: Secondary | ICD-10-CM | POA: Diagnosis not present

## 2022-01-09 DIAGNOSIS — R946 Abnormal results of thyroid function studies: Secondary | ICD-10-CM | POA: Diagnosis not present

## 2022-01-17 DIAGNOSIS — I10 Essential (primary) hypertension: Secondary | ICD-10-CM | POA: Diagnosis not present

## 2022-01-19 ENCOUNTER — Ambulatory Visit: Payer: Self-pay | Admitting: Cardiology

## 2022-02-15 DIAGNOSIS — I10 Essential (primary) hypertension: Secondary | ICD-10-CM | POA: Diagnosis not present

## 2022-04-16 DIAGNOSIS — L821 Other seborrheic keratosis: Secondary | ICD-10-CM | POA: Diagnosis not present

## 2022-04-16 DIAGNOSIS — L814 Other melanin hyperpigmentation: Secondary | ICD-10-CM | POA: Diagnosis not present

## 2022-04-16 DIAGNOSIS — Z85828 Personal history of other malignant neoplasm of skin: Secondary | ICD-10-CM | POA: Diagnosis not present

## 2022-04-16 DIAGNOSIS — L570 Actinic keratosis: Secondary | ICD-10-CM | POA: Diagnosis not present

## 2022-04-16 DIAGNOSIS — Z85820 Personal history of malignant melanoma of skin: Secondary | ICD-10-CM | POA: Diagnosis not present

## 2022-04-16 DIAGNOSIS — L820 Inflamed seborrheic keratosis: Secondary | ICD-10-CM | POA: Diagnosis not present

## 2022-04-23 DIAGNOSIS — N6320 Unspecified lump in the left breast, unspecified quadrant: Secondary | ICD-10-CM | POA: Diagnosis not present

## 2022-04-23 DIAGNOSIS — Z658 Other specified problems related to psychosocial circumstances: Secondary | ICD-10-CM | POA: Diagnosis not present

## 2022-04-30 DIAGNOSIS — C801 Malignant (primary) neoplasm, unspecified: Secondary | ICD-10-CM

## 2022-04-30 HISTORY — DX: Malignant (primary) neoplasm, unspecified: C80.1

## 2022-05-01 DIAGNOSIS — R928 Other abnormal and inconclusive findings on diagnostic imaging of breast: Secondary | ICD-10-CM | POA: Diagnosis not present

## 2022-05-01 DIAGNOSIS — N6311 Unspecified lump in the right breast, upper outer quadrant: Secondary | ICD-10-CM | POA: Diagnosis not present

## 2022-05-09 ENCOUNTER — Other Ambulatory Visit: Payer: Self-pay

## 2022-05-09 DIAGNOSIS — N6325 Unspecified lump in the left breast, overlapping quadrants: Secondary | ICD-10-CM | POA: Diagnosis not present

## 2022-05-09 DIAGNOSIS — R928 Other abnormal and inconclusive findings on diagnostic imaging of breast: Secondary | ICD-10-CM | POA: Diagnosis not present

## 2022-05-09 DIAGNOSIS — Z170 Estrogen receptor positive status [ER+]: Secondary | ICD-10-CM | POA: Diagnosis not present

## 2022-05-09 DIAGNOSIS — C50412 Malignant neoplasm of upper-outer quadrant of left female breast: Secondary | ICD-10-CM | POA: Diagnosis not present

## 2022-05-10 ENCOUNTER — Encounter: Payer: Self-pay | Admitting: Internal Medicine

## 2022-05-11 ENCOUNTER — Telehealth: Payer: Self-pay | Admitting: Hematology

## 2022-05-11 NOTE — Telephone Encounter (Signed)
Spoke to patient to confirm morning clinic appointment on 5/17, April Burns will send paperwork ?

## 2022-05-14 ENCOUNTER — Encounter: Payer: Self-pay | Admitting: *Deleted

## 2022-05-14 DIAGNOSIS — Z171 Estrogen receptor negative status [ER-]: Secondary | ICD-10-CM | POA: Insufficient documentation

## 2022-05-14 DIAGNOSIS — C50412 Malignant neoplasm of upper-outer quadrant of left female breast: Secondary | ICD-10-CM

## 2022-05-15 ENCOUNTER — Encounter: Payer: Self-pay | Admitting: *Deleted

## 2022-05-15 NOTE — Progress Notes (Signed)
?Radiation Oncology         [phone] ?________________________________ ? ?Multidisciplinary Breast Oncology Clinic [facility]) ?Initial Outpatient Consultation ? ?Name: April Burns MRN: [id number]  ?Date: 05/16/2022  DOB: [birth year] ? ?QM:[id number], Herbie Baltimore, MD  Erroll Luna, MD  ? ?REFERRING PHYSICIAN: Erroll Luna, MD ? ?DIAGNOSIS: The encounter diagnosis was Malignant neoplasm of upper-outer quadrant of left breast in female, estrogen receptor negative ([place]). ? ?Stage (cT1c, cN0, cM0) Left Breast UOQ, Invasive Apocrine Carcinoma, ER- / PR- / Her2-, No Grade ? ?  ICD-10-CM   ?1. Malignant neoplasm of upper-outer quadrant of left breast in female, estrogen receptor negative ([place])  C50.412   ? Z17.1   ?  ? ? ?HISTORY OF PRESENT ILLNESS::April Burns is a 88 y.o. female who is presenting to the office today for evaluation of her newly diagnosed breast cancer. She is accompanied by one of her daughters. She is doing well overall.  ? ?The patient initially presented with a palpable lump in the upper outer left breast x 2 weeks. Subsequent bilateral diagnostic mammography with tomography at [facility] on 05/01/22 showed: an indeterminate 1.4 cm irregular high density mass in the upper outer left breast, located 4 cmfn. Left breast ultrasound on that same date re-demonstrated the suspicious left breast mass measuring 1.3 x 1 x 1.3 cm, located at the 1 o'clock position, 4 cmfn, at a middle depth.  ? ?Biopsy of the 1 o'clock left breast mass on 05/09/22 showed: invasive apocrine carcinoma measuring 1.1 cm in the greatest tumor dimension. Prognostic indicators significant for: estrogen receptor, 0% negative and progesterone receptor, 0% negative. Proliferation marker Ki67 at 10%. HER2 negative.  Immunohistochemistry for androgen receptor is strongly positive which supports the diagnosis of apocrine carcinoma. ? ?Another left breast ultrasound was performed on 05/09/22 for further evaluation of an additional 3  o'clock left breast mass seen on post-biopsy [place]. This confirmed an additional 0.4 x 0.6 cm irregular mass located in the 3 o'clock left breast suspicious of malignancy. This mass is located 2.4 cm posterior to the 1 o'clock biopsied mass. ? ?Menarche: 88 years old ?Age at first live birth: 88 years old ?GP: 3 ?LMP: date not indicated on the provided form ?Contraceptive: never used ?HRT: never used  ? ?The patient was referred today for presentation in the multidisciplinary conference.  Radiology studies and pathology slides were presented there for review and discussion of treatment options.  A consensus was discussed regarding potential next steps. ? ?PREVIOUS RADIATION THERAPY: No ? ?PAST MEDICAL HISTORY:  ?Past Medical History:  ?Diagnosis Date  ? Allergic rhinitis   ? Arthritis   ? gout  ? Diverticulosis   ? no current flare up  ? Gout   ? Hearing loss   ? HTN (hypertension)   ? Hypercholesterolemia   ? Lung nodule   ? Status post bronchoscopy reported to be noncancerous  ? Osteopenia   ? Restless leg syndrome   ? ? ?PAST SURGICAL HISTORY: ?Past Surgical History:  ?Procedure Laterality Date  ? ABDOMINAL HYSTERECTOMY    ? C section X2    ? cataract extractions bilateral  2010/2011  ? EYE SURGERY    ? bilateral  ? rt leg vein strippin for varicosities    ? stoneburner    ? TAH ovaries intact    ? VIDEO BRONCHOSCOPY  02/01/2012  ? Procedure: VIDEO BRONCHOSCOPY;  Surgeon: Grace Isaac, MD;  Location: [place];  Service: Thoracic;  Laterality: N/A;  ? ? ?FAMILY HISTORY:  ?  Family History  ?Problem Relation Age of Onset  ? Heart disease Father   ? Alcohol abuse Brother   ? Cancer Maternal Grandmother   ?     gastric cancer  ? ? ?SOCIAL HISTORY:  ?Social History  ? ?Socioeconomic History  ? Marital status: Single  ?  Spouse name: Not on file  ? Number of children: 3  ? Years of education: Not on file  ? Highest education level: Not on file  ?Occupational History  ? Not on file  ?Tobacco Use  ? Smoking status: Former  ?   Packs/day: 1.00  ?  Years: 50.00  ?  Pack years: 50.00  ?  Types: Cigarettes  ?  Quit date: 12/31/1993  ?  Years since quitting: 28.3  ? Smokeless tobacco: Never  ?Substance and Sexual Activity  ? Alcohol use: Not Currently  ? Drug use: No  ? Sexual activity: Not on file  ?Other Topics Concern  ? Not on file  ?Social History Narrative  ? 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  ? One of her daughter's has bipolar disease. Margorie John- in-Law Ray Church died suddenly of MI at age 88 yo.   ? ?Social Determinants of Health  ? ?Financial Resource Strain: Not on file  ?Food Insecurity: Not on file  ?Transportation Needs: Not on file  ?Physical Activity: Not on file  ?Stress: Not on file  ?Social Connections: Not on file  ? ? ?ALLERGIES:  ?Allergies  ?Allergen Reactions  ? Neosporin [Neomycin-Polymyxin-Gramicidin] Dermatitis  ? Statins Other (See Comments)  ?  Myalgias and unsteady gait  ? ? ?MEDICATIONS:  ?Current Outpatient Medications  ?Medication Sig Dispense Refill  ? allopurinol (ZYLOPRIM) 100 MG tablet Take by mouth.    ? amLODipine-olmesartan (AZOR) 5-20 MG tablet 1 tablet    ? calcium-vitamin D (OSCAL WITH D) 500-200 MG-UNIT per tablet Take 1 tablet by mouth 2 (two) times daily.    ? cloNIDine (CATAPRES) 0.1 MG tablet 1 tablet    ? COVID-19 mRNA Vac-TriS, Pfizer, SUSP injection Inject into the muscle. 0.3 mL 0  ? hydrochlorothiazide (HYDRODIURIL) 12.5 MG tablet 1 tablet in the morning    ? ibandronate (BONIVA) 150 MG tablet Take by mouth.    ? metoprolol succinate (TOPROL-XL) 25 MG 24 hr tablet Take 25 mg by mouth daily.    ? pantoprazole (PROTONIX) 40 MG tablet Take 1 tablet (40 mg total) by mouth daily at 12 noon. 30 tablet 0  ? zolpidem (AMBIEN) 5 MG tablet Take 2.5 mg by mouth at bedtime.    ? ?No current facility-administered medications for this encounter.  ? ? ?REVIEW OF SYSTEMS: A 10+ POINT REVIEW OF SYSTEMS WAS OBTAINED including neurology, dermatology, psychiatry, cardiac,  respiratory, lymph, extremities, GI, GU, musculoskeletal, constitutional, reproductive, HEENT. On the provided form, she reports hearing loss, wearing hearing aids, sinus problems, sleeping with 2 pillows, breast lump, bruising easily, and gout. She denies any other symptoms.  ?  ?PHYSICAL EXAM:   ? ? 05/16/2022  ?Vitals with BMI   ?Height '5\' 2"'    ?Weight 113 lbs 13 oz   ?BMI 20.81   ?Systolic 540 !   ?Diastolic 42 !   ?Pulse 51 !   ?  ?Lungs are clear to auscultation bilaterally. Heart has regular rate and rhythm. No palpable cervical, supraclavicular, or axillary adenopathy. Abdomen soft, non-tender, normal bowel sounds. ?Breast: Right breast with no palpable mass, nipple discharge, or bleeding. Left breast with some bruising  in the upper outer aspect with a band aid in place and a palpable lump in the 10 o'clock position measuring 2.5 cm in size. No nipple discharge or bleeding.  ? ?KPS = 90 ? ?100 - Normal; no complaints; no evidence of disease. ?90   - Able to carry on normal activity; minor signs or symptoms of disease. ?80   - Normal activity with effort; some signs or symptoms of disease. ?40   - Cares for self; unable to carry on normal activity or to do active work. ?60   - Requires occasional assistance, but is able to care for most of his personal needs. ?50   - Requires considerable assistance and frequent medical care. ?78   - Disabled; requires special care and assistance. ?30   - Severely disabled; hospital admission is indicated although death not imminent. ?20   - Very sick; hospital admission necessary; active supportive treatment necessary. ?10   - Moribund; fatal processes progressing rapidly. ?0     - Dead ? ?Karnofsky DA, Abelmann WH, Craver LS and Burchenal [facility] [phone]) The use of the nitrogen mustards in the palliative treatment of carcinoma: with particular reference to bronchogenic carcinoma Cancer 1 634-56 ? ?LABORATORY DATA:  ?Lab Results  ?Component Value Date  ? WBC 4.9 05/16/2022  ? HGB  11.0 (L) 05/16/2022  ? HCT 33.6 (L) 05/16/2022  ? MCV 99.7 05/16/2022  ? PLT 236 05/16/2022  ? ?Lab Results  ?Component Value Date  ? NA 140 05/16/2022  ? K 4.3 05/16/2022  ? CL 104 05/16/2022  ? CO2 29 05/1

## 2022-05-16 ENCOUNTER — Inpatient Hospital Stay: Payer: Medicare Other

## 2022-05-16 ENCOUNTER — Inpatient Hospital Stay (HOSPITAL_BASED_OUTPATIENT_CLINIC_OR_DEPARTMENT_OTHER): Payer: Medicare Other | Admitting: Genetic Counselor

## 2022-05-16 ENCOUNTER — Encounter: Payer: Self-pay | Admitting: *Deleted

## 2022-05-16 ENCOUNTER — Ambulatory Visit: Payer: Self-pay | Admitting: Surgery

## 2022-05-16 ENCOUNTER — Ambulatory Visit: Payer: Medicare Other | Admitting: Physical Therapy

## 2022-05-16 ENCOUNTER — Inpatient Hospital Stay: Payer: Medicare Other | Admitting: Licensed Clinical Social Worker

## 2022-05-16 ENCOUNTER — Encounter: Payer: Self-pay | Admitting: Hematology

## 2022-05-16 ENCOUNTER — Inpatient Hospital Stay: Payer: Medicare Other | Attending: Hematology | Admitting: Hematology

## 2022-05-16 ENCOUNTER — Other Ambulatory Visit: Payer: Self-pay

## 2022-05-16 ENCOUNTER — Encounter (HOSPITAL_BASED_OUTPATIENT_CLINIC_OR_DEPARTMENT_OTHER): Payer: Self-pay | Admitting: Surgery

## 2022-05-16 ENCOUNTER — Ambulatory Visit
Admission: RE | Admit: 2022-05-16 | Discharge: 2022-05-16 | Disposition: A | Discharge status: Home / Self Care | Payer: Medicare Other | Source: Ambulatory Visit | Attending: Radiation Oncology | Admitting: Radiation Oncology

## 2022-05-16 VITALS — BP 156/42 | HR 51 | Temp 97.9°F | Resp 18 | Ht 62.0 in | Wt 113.8 lb

## 2022-05-16 DIAGNOSIS — Z171 Estrogen receptor negative status [ER-]: Secondary | ICD-10-CM

## 2022-05-16 DIAGNOSIS — C50412 Malignant neoplasm of upper-outer quadrant of left female breast: Secondary | ICD-10-CM

## 2022-05-16 DIAGNOSIS — C50912 Malignant neoplasm of unspecified site of left female breast: Secondary | ICD-10-CM

## 2022-05-16 DIAGNOSIS — Z800 Family history of malignant neoplasm of digestive organs: Secondary | ICD-10-CM | POA: Diagnosis not present

## 2022-05-16 LAB — CBC WITH DIFFERENTIAL (CANCER CENTER ONLY)
Abs Immature Granulocytes: 0.01 10*3/uL (ref 0.00–0.07)
Basophils Absolute: 0 10*3/uL (ref 0.0–0.1)
Basophils Relative: 1 %
Eosinophils Absolute: 0.2 10*3/uL (ref 0.0–0.5)
Eosinophils Relative: 3 %
HCT: 33.6 % — ABNORMAL LOW (ref 36.0–46.0)
Hemoglobin: 11 g/dL — ABNORMAL LOW (ref 12.0–15.0)
Immature Granulocytes: 0 %
Lymphocytes Relative: 22 %
Lymphs Abs: 1.1 10*3/uL (ref 0.7–4.0)
MCH: 32.6 pg (ref 26.0–34.0)
MCHC: 32.7 g/dL (ref 30.0–36.0)
MCV: 99.7 fL (ref 80.0–100.0)
Monocytes Absolute: 0.5 10*3/uL (ref 0.1–1.0)
Monocytes Relative: 11 %
Neutro Abs: 3.1 10*3/uL (ref 1.7–7.7)
Neutrophils Relative %: 63 %
Platelet Count: 236 10*3/uL (ref 150–400)
RBC: 3.37 MIL/uL — ABNORMAL LOW (ref 3.87–5.11)
RDW: 14.1 % (ref 11.5–15.5)
WBC Count: 4.9 10*3/uL (ref 4.0–10.5)
nRBC: 0 % (ref 0.0–0.2)

## 2022-05-16 LAB — CMP (CANCER CENTER ONLY)
ALT: 11 U/L (ref 0–44)
AST: 18 U/L (ref 15–41)
Albumin: 4.2 g/dL (ref 3.5–5.0)
Alkaline Phosphatase: 101 U/L (ref 38–126)
Anion gap: 7 (ref 5–15)
BUN: 41 mg/dL — ABNORMAL HIGH (ref 8–23)
CO2: 29 mmol/L (ref 22–32)
Calcium: 9.4 mg/dL (ref 8.9–10.3)
Chloride: 104 mmol/L (ref 98–111)
Creatinine: 1.85 mg/dL — ABNORMAL HIGH (ref 0.44–1.00)
GFR, Estimated: 25 mL/min — ABNORMAL LOW (ref >60)
Glucose, Bld: 90 mg/dL (ref 70–99)
Potassium: 4.3 mmol/L (ref 3.5–5.1)
Sodium: 140 mmol/L (ref 135–145)
Total Bilirubin: 0.7 mg/dL (ref 0.3–1.2)
Total Protein: 7 g/dL (ref 6.5–8.1)

## 2022-05-16 LAB — RESEARCH LABS

## 2022-05-16 LAB — GENETIC SCREENING ORDER

## 2022-05-16 NOTE — Progress Notes (Signed)
[place] Clinical Social Work  ?Initial Assessment ? ? ?April Burns is a 88 y.o. year old female accompanied by daughter, [name]. Clinical Social Work was referred by  [facility]  for assessment of psychosocial needs.  ? ?SDOH (Social Determinants of Health) assessments performed: Yes ?SDOH Interventions   ? ?Flowsheet Row Most Recent Value  ?SDOH Interventions   ?Food Insecurity Interventions Intervention Not Indicated  ?Financial Strain Interventions Intervention Not Indicated  ?Housing Interventions Intervention Not Indicated  ?Transportation Interventions Intervention Not Indicated  ? ?  ?  ?SDOH Screenings  ? ?Alcohol Screen: Not on file  ?Depression (PHQ2-9): Not on file  ?Financial Resource Strain: Low Risk   ? Difficulty of Paying Living Expenses: Not hard at all  ?Food Insecurity: No Food Insecurity  ? Worried About Charity fundraiser in the Last Year: Never true  ? Ran Out of Food in the Last Year: Never true  ?Housing: Low Risk   ? Last Housing Risk Score: 0  ?Physical Activity: Not on file  ?Social Connections: Not on file  ?Stress: Not on file  ?Tobacco Use: Medium Risk  ? Smoking Tobacco Use: Former  ? Smokeless Tobacco Use: Never  ? Passive Exposure: Not on file  ?Transportation Needs: No Transportation Needs  ? Lack of Transportation (Medical): No  ? Lack of Transportation (Non-Medical): No  ? ? ? ?Distress Screen completed: Yes ? ?  05/16/2022  ? 12:28 PM  ?ONCBCN DISTRESS SCREENING  ?Screening Type Initial Screening  ?Distress experienced in past week (1-10) 0  ? ? ? ? ?Family/Social Information:  ?Housing Arrangement: patient lives with daughter, Lenna Sciara . Daughter [name] and son Hank live in [place] state ?Family members/support persons in your life? Family (Charlie) and Friends. Will be visiting [place] for 4 months after surgery ?Transportation concerns: no  ?Employment: Retired .  ?Income source: [place] ?Financial concerns: No ?Type of concern: None ?Food access  concerns: no ?Religious or spiritual practice: Not known ?Services Currently in place:  n/a ? ?Coping/ Adjustment to diagnosis: ?Patient understands treatment plan and what happens next? yes ?Concerns about diagnosis and/or treatment: I'm not especially worried about anything ?Patient reported stressors:  none reported ?Hopes and/or priorities: have surgery completed before her trip to [place] ?Current coping skills/ strengths: Capable of independent living , Motivation for treatment/growth , and Supportive family/friends  ? ? ? SUMMARY: ?Current SDOH Barriers:  ?No significant barriers at this time ? ?Clinical Social Work Clinical Goal(s):  ?No clinical social work goals at this time ? ?Interventions: ?Discussed common feeling and emotions when being diagnosed with cancer, and the importance of support during treatment ?Informed patient of the support team roles and support services at [facility] ?Provided CSW contact information and encouraged patient to call with any questions or concerns ? ? ?Follow Up Plan: Patient will contact CSW with any support or resource needs ?Patient verbalizes understanding of plan: Yes ? ? ? ?Nicky Milhouse E Kentrail Shew, LCSW ? ? ?Patient is participating in a Managed Medicaid Plan:  No ?

## 2022-05-16 NOTE — Research (Signed)
[employer] 2021-05 - Specimen Collection Study to Evaluate Biomarkers in Subjects with Cancer    This Nurse has reviewed this patient's inclusion and exclusion criteria and confirmed April Burns is eligible for study participation.  Patient will continue with enrollment.  Eligibility confirmed by treating investigator, who also agrees that patient should proceed with enrollment.  Medical History:  High Blood Pressure  Yes Coronary Artery Disease No Lupus    No Rheumatoid Arthritis  No Diabetes   No      Lynch Syndrome  No  Is the patient currently taking a magnesium supplement?   No  Does the patient have a personal history of cancer (greater than 5 years ago)?  Yes If yes, Cancer type and date of diagnosis?   Melanoma 2017  Has this previous diagnosis been treated? Yes      If so, treatment type? Surgical  Does the patient have a family history of cancer in 1st or 2nd degree relatives? No  Does the patient have history of alcohol consumption? No    Does the patient have history of cigarette, cigar, pipe, or chewing tobacco use?  Yes  If yes, current for former? Quit 1995 If yes, type (Cigarette, cigar, pipe, and/or chewing tobacco)? Cigarettes   If former, year stopped? 1995 Number of years? 40 Packs/number/containers per day? 1   Ludell Zacarias G. Nyleah Mcginnis, RN, BSN, [facility] She  Her  [facility] Clinical Research Nurse [facility] Direct Dial [phone]  Pager [phone] 05/16/2022 11:35 AM

## 2022-05-16 NOTE — Progress Notes (Signed)
?[place]   ?[phone] [phone]   ?Clinic New Consult Note  ? ?Patient Care Team: ?April Huddle, MD as PCP - General (Internal Medicine) ?April Kaufmann, RN as Oncology Nurse Navigator ?April Germany, RN as Oncology Nurse Navigator ?April Luna, MD as Consulting Physician (General Surgery) ?April Merle, MD as Consulting Physician (Hematology) ?April Pray, MD as Consulting Physician (Radiation Oncology) ? ?Date of Service:  05/16/2022  ? ?CHIEF COMPLAINTS/PURPOSE OF CONSULTATION:  ?Left Breast Cancer, ER- ? ?REFERRING PHYSICIAN:  ?[facility] ? ? ?ASSESSMENT & PLAN:  ?April Burns is a 88 y.o. post-hysterectomy female with a history of HTN ? ?1. Malignant neoplasm of upper-outer quadrant of left breast, Apocrine carcinoma, Burns IB, c(T1c, N0), triple negative ?-presented with palpable left breast lump. B/l MM and left [place] on 05/01/22 showed a 1.3 cm lobulated left breast mass at 1 o'clock. Biopsy on 05/09/22 revealed invasive apocrine carcinoma, triple negative  ?--We discussed her imaging findings and the biopsy results in great details. ?-Given the early disease, she likely need a lumpectomy. She is agreeable with that. She was seen by Dr. Brantley Burns today and likely will proceed with surgery soon.  Given her advanced age, sentinel lymph node biopsy is not recommended. ?-We discussed the aggressive nature of triple negative breast cancer.  Her histology is uncommon, and tend to be agressive.due to advanced age, she is not a candidate for adjuvant chemotherapy.  Given her negative ER expression, antiestrogen therapy is not effective. I discussed that since her tumor tested strongly positive for androgen, we could use androgen antagonist if she develops recurrence. ?-She was also seen by radiation oncologist Dr. Sondra Burns today. Given her age, we will likely forego radiation However, they would consider this if she is found to have a positive margin. ?-Patient and her  daughter also favor no additional therapy after surgery, they prefer for disease monitoring. ?-We also discussed the breast cancer surveillance after her surgery. She will continue annual screening mammogram, self exam, and a routine office visit with lab and exam. We discussed that she doesn't need to follow [place] as she will not be taking antiestrogen therapy, but we are happy to follow her for surveillance. She would like to follow with [place]. ?-I encouraged her to have healthy diet and exercise regularly.   ? ? ?PLAN:  ?-she will proceed with lumpectomy soon, no adjuvant chemo or radiation  ?-she is planning to be out of town for 4 months after surgery  ?-will consider radiation if margin is positive ?-Lab and follow-up with me in 6 months ? ? ?Oncology History Overview Note  ? Cancer Staging  ?Malignant neoplasm of upper-outer quadrant of left breast in female, estrogen receptor negative ([place]) ?Staging form: Breast, AJCC 8th Edition ?- Clinical Burns from 05/09/2022: cT1c, cN0, cM0, GX, ER-, PR-, HER2- - Signed by April Merle, MD on 05/15/2022 ? ?  ?Malignant neoplasm of upper-outer quadrant of left breast in female, estrogen receptor negative ([place])  ?05/01/2022 Imaging  ? Exam: Breast Ultrasound - Limited Unilateral - L ? ?CLINICAL: Patient noticed a lump 2 weeks ago in the - Left Breast Upper Outer Quadrant ? ?IMPRESSION: ?The 1.3 x 1 x 1.3 cm lobulated mass in the left breast is highly suggestive of malignancy. ?  ?05/09/2022 Cancer Staging  ? Staging form: Breast, AJCC 8th Edition ?- Clinical Burns from 05/09/2022: cT1c, cN0, cM0, GX, ER-, PR-, HER2- - Signed by April Merle, MD on 05/15/2022 ?Burns prefix: Initial diagnosis ?Histologic grading  system: 3 grade system ? ?  ?05/09/2022 Initial Biopsy  ? Diagnosis ?Breast, left, needle core biopsy, 1:00 4 cmfn ?- INVASIVE APOCRINE CARCINOMA. ?- SEE NOTE. ?Diagnosis Note ?The greatest tumor dimension is 1.1 cm. ? ?Addendum: Immunohistochemistry for androgen receptor is strongly  positive which supports the diagnosis. ? ?PROGNOSTIC INDICATORS ?Results: ?The tumor cells are EQUIVOCAL for Her2 (2+). Her2 by FISH will be performed and the results reported separately. ?Estrogen Receptor: 0%, NEGATIVE ?Progesterone Receptor: 0%, NEGATIVE ?Proliferation Marker Ki67: 10% ? ?FLUORESCENCE IN-SITU HYBRIDIZATION ?Results: ?GROUP 5: HER2 **NEGATIVE** ?  ?05/14/2022 Initial Diagnosis  ? Malignant neoplasm of upper-outer quadrant of left breast in female, estrogen receptor negative ([place]) ? ?  ? ? ? ?HISTORY OF PRESENTING ILLNESS:  ?April Burns 88 y.o. female is a here because of breast cancer. The patient was referred by [facility]. The patient presents to the clinic today accompanied by her daughter April Burns. ? ? ?She presented with a palpable left breast lump. She underwent bilateral diagnostic mammography and left breast ultrasonography on 05/01/22 showing: 1.3 cm left breast mass at 1 o'clock. ? ?Biopsy on 05/09/22 showed: invasive apocrine carcinoma. Prognostic indicators significant for: estrogen receptor, 0% negative and progesterone receptor, 0% negative. Proliferation marker Ki67 at 10%. HER2 negative by FISH. ? ? ?Today the patient notes they felt/feeling prior/after... ? ? ?She has a PMHx of.... ?-lumpectomy in 1965 for "leaky milk duct" ?-HTN, on medicine ?-s/p hysterectomy, emergent due to ruptured uterus during delivery ? -ovaries intact ? ?Socially... 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 ?-she lives with one of her daughters (not the one present today) but does everything for herself ?-she denies family history of breast cancer ?-former smoker for many years, quit in 1995 ? ?GYN HISTORY  ?Menarchal: 88 years old ?LMP: with hysterectomy (age 12) ?Contraceptive: ?HRT: none ?G4P3, first at age 28 ? ? ?REVIEW OF SYSTEMS:    ?Constitutional: Denies fevers, chills or abnormal night sweats ?Eyes: Denies blurriness of vision, double vision or watery  eyes ?Ears, nose, mouth, throat, and face: Denies mucositis or sore throat ?Respiratory: Denies cough, dyspnea or wheezes ?Cardiovascular: Denies palpitation, chest discomfort or lower extremity swelling ?Gastrointestinal:  Denies nausea, heartburn or change in bowel habits ?Skin: Denies abnormal skin rashes ?Lymphatics: Denies new lymphadenopathy or easy bruising ?Neurological:Denies numbness, tingling or new weaknesses ?Behavioral/Psych: Mood is stable, no new changes  ?All other systems were reviewed with the patient and are negative. ? ? ?MEDICAL HISTORY:  ?Past Medical History:  ?Diagnosis Date  ? Allergic rhinitis   ? Anemia   ? Arthritis   ? gout  ? Cancer ([place]) 04/2022  ? left breast invasive apocrine carcinoma  ? Chronic kidney disease   ? Diverticulosis   ? no current flare up  ? GERD (gastroesophageal reflux disease)   ? Gout   ? Hearing loss   ? HTN (hypertension)   ? Hypercholesterolemia   ? Lung nodule   ? Status post bronchoscopy reported to be noncancerous  ? Osteopenia   ? Restless leg syndrome   ? ? ?SURGICAL HISTORY: ?Past Surgical History:  ?Procedure Laterality Date  ? ABDOMINAL HYSTERECTOMY    ? C section X2    ? cataract extractions bilateral  2010/2011  ? EYE SURGERY    ? bilateral  ? rt leg vein strippin for varicosities    ? stoneburner    ? TAH ovaries intact    ? VIDEO BRONCHOSCOPY  02/01/2012  ? Procedure: VIDEO BRONCHOSCOPY;  Surgeon: Grace Isaac, MD;  Location: [place];  Service: Thoracic;  Laterality: N/A;  ? ? ?SOCIAL HISTORY: ?Social History  ? ?Socioeconomic History  ? Marital status: Single  ?  Spouse name: Not on file  ? Number of children: 3  ? Years of education: Not on file  ? Highest education level: Not on file  ?Occupational History  ? Not on file  ?Tobacco Use  ? Smoking status: Former  ?  Packs/day: 1.00  ?  Years: 50.00  ?  Pack years: 50.00  ?  Types: Cigarettes  ?  Quit date: 12/31/1993  ?  Years since quitting: 28.3  ? Smokeless tobacco: Never  ?Substance and Sexual  Activity  ? Alcohol use: Not Currently  ? Drug use: No  ? Sexual activity: Not Currently  ?  Birth control/protection: Surgical, Post-menopausal  ?Other Topics Concern  ? Not on file  ?Social History Narrative  ? U

## 2022-05-16 NOTE — Research (Signed)
[employer] 2021-05 - Specimen Collection Study to Evaluate Biomarkers in Subjects with Cancer   ?This Nurse has reviewed this patient's inclusion and exclusion criteria as a second review and confirms April Burns is eligible for study participation.  Patient may continue with enrollment.  ?Foye Spurling, BSN, RN, CCRP ?Clinical Research Nurse II ?05/16/2022 11:33 AM ? ?

## 2022-05-16 NOTE — Research (Signed)
Trial: [employer] 2021-05 - Specimen Collection Study to Evaluate Biomarkers in Subjects with Cancer   Patient April Burns was identified by Dr Burr Medico as a potential candidate for the above listed study.  This Clinical Research Nurse met with [name], [id number], on 05/16/22 in a manner and location that ensures patient privacy to discuss participation in the above listed research study.  Patient is Accompanied by her support person .  A copy of the informed consent document with embedded HIPAA language was provided to the patient.  Patient reads, speaks, and understands [place].    Patient was provided with the business card of this Nurse and encouraged to contact the research team with any questions.  Patient was provided the option of taking informed consent documents home to review and was encouraged to review at their convenience with their support network, including other care providers. Patient is comfortable with making a decision regarding study participation today.  As outlined in the informed consent form, this Nurse and [name] discussed the purpose of the research study, the investigational nature of the study, study procedures and requirements for study participation, potential risks and benefits of study participation, as well as alternatives to participation. This study is not blinded. The patient understands participation is voluntary and they may withdraw from study participation at any time.  This study does not involve randomization.  This study does not involve an investigational drug or device. This study does not involve a placebo. Patient understands enrollment is pending full eligibility review.   Confidentiality and how the patient's information will be used as part of study participation were discussed.  Patient was informed there is reimbursement provided for their time and effort spent on trial participation.  The patient is encouraged to  discuss research study participation with their insurance provider to determine what costs they may incur as part of study participation, including research related injury.    All questions were answered to patient's satisfaction.  The informed consent with embedded HIPAA language was reviewed page by page.  The patient's mental and emotional status is appropriate to provide informed consent, and the patient verbalizes an understanding of study participation.  Patient has agreed to participate in the above listed research study and has voluntarily signed the informed consent date 29 Jan 2022 with embedded HIPAA language, version 29 Jan 2022  on 05/16/22 at 1120AM.  The patient was provided with a copy of the signed informed consent form with embedded HIPAA language for their reference.  No study specific procedures were obtained prior to the signing of the informed consent document.  Approximately 20 minutes were spent with the patient reviewing the informed consent documents.  Patient was not requested to complete a Release of Information form.  [name] [name], RN, BSN, [facility]  Her  [facility] Clinical Research Nurse [place] [phone]  Pager [phone] 05/16/2022 11:37 AM

## 2022-05-17 ENCOUNTER — Encounter: Payer: Self-pay | Admitting: Genetic Counselor

## 2022-05-17 ENCOUNTER — Ambulatory Visit: Payer: Self-pay | Admitting: Surgery

## 2022-05-17 DIAGNOSIS — Z800 Family history of malignant neoplasm of digestive organs: Secondary | ICD-10-CM | POA: Insufficient documentation

## 2022-05-17 DIAGNOSIS — C50912 Malignant neoplasm of unspecified site of left female breast: Secondary | ICD-10-CM

## 2022-05-17 NOTE — Progress Notes (Signed)
REFERRING PROVIDER: Truitt Merle, MD [place],  [place] [address]  PRIMARY PROVIDER:  [name], MD  PRIMARY REASON FOR VISIT:  1. Malignant neoplasm of upper-outer quadrant of left breast in female, estrogen receptor negative ([place])   2. Family history- stomach Burns      HISTORY OF PRESENT ILLNESS:   April Burns, a 88 y.o. female, was seen for a Elida Burns genetics consultation at the request of Dr. Burr Medico due to a personal history of Burns.  April Burns presents to clinic today to discuss the possibility of a hereditary predisposition to Burns, to discuss genetic testing, and to further clarify her future Burns risks, as well as potential Burns risks for family members.   In May 2023, at the age of 63, April Burns was diagnosed with invasive apocrine carcinoma of the left breast (triple negative). The treatment plan is pending.  She also has a history of melanoma on her leg diagnosed in 2018.   Burns HISTORY:  Oncology History Overview Note   Burns Staging  Malignant neoplasm of upper-outer quadrant of left breast in female, estrogen receptor negative ([place]) Staging form: Breast, AJCC 8th Edition - Clinical stage from 05/09/2022: cT1c, cN0, cM0, GX, ER-, PR-, HER2- - Signed by Truitt Merle, MD on 05/15/2022    Malignant neoplasm of upper-outer quadrant of left breast in female, estrogen receptor negative ([place])  05/01/2022 Imaging   Exam: Breast Ultrasound - Limited Unilateral - L  CLINICAL: Patient noticed a lump 2 weeks ago in the - Left Breast Upper Outer Quadrant  IMPRESSION: The 1.3 x 1 x 1.3 cm lobulated mass in the left breast is highly suggestive of malignancy.   05/09/2022 Burns Staging   Staging form: Breast, AJCC 8th Edition - Clinical stage from 05/09/2022: Stage IB (cT1c, cN0, cM0, G2, ER-, PR-, HER2-) - Signed by Truitt Merle, MD on 05/16/2022 Stage prefix: Initial diagnosis Nuclear grade: G2 Histologic grading system: 3 grade  system    05/09/2022 Initial Biopsy   Diagnosis Breast, left, needle core biopsy, 1:00 4 cmfn - INVASIVE APOCRINE CARCINOMA. - SEE NOTE. Diagnosis Note The greatest tumor dimension is 1.1 cm.  Addendum: Immunohistochemistry for androgen receptor is strongly positive which supports the diagnosis.  PROGNOSTIC INDICATORS Results: The tumor cells are EQUIVOCAL for Her2 (2+). Her2 by FISH will be performed and the results reported separately. Estrogen Receptor: 0%, NEGATIVE Progesterone Receptor: 0%, NEGATIVE Proliferation Marker Ki67: 10%  FLUORESCENCE IN-SITU HYBRIDIZATION Results: GROUP 5: HER2 **NEGATIVE**   05/14/2022 Initial Diagnosis   Malignant neoplasm of upper-outer quadrant of left breast in female, estrogen receptor negative ([place])       RISK FACTORS:  Colonoscopy: yes;  most recent unknown . Hysterectomy: yes in 1964 Menarche was at age 14.  First live birth at age 65.  OCP use for approximately 0 years.  HRT use: 0 years.  Past Medical History:  Diagnosis Date   Allergic rhinitis    Anemia    Arthritis    gout   Burns ([place]) 04/2022   left breast invasive apocrine carcinoma   Chronic kidney disease    Diverticulosis    no current flare up   GERD (gastroesophageal reflux disease)    Gout    Hearing loss    HTN (hypertension)    Hypercholesterolemia    Lung nodule    Status post bronchoscopy reported to be noncancerous   Osteopenia    Restless leg syndrome     Past Surgical History:  Procedure Laterality Date   ABDOMINAL HYSTERECTOMY     C section X2     cataract extractions bilateral  2010/2011   EYE SURGERY     bilateral   rt leg vein strippin for varicosities     stoneburner     TAH ovaries intact     VIDEO BRONCHOSCOPY  02/01/2012   Procedure: VIDEO BRONCHOSCOPY;  Surgeon: Grace Isaac, MD;  Location: [place];  Service: Thoracic;  Laterality: N/A;    FAMILY HISTORY:  We obtained a detailed, 4-generation family history.  Significant  diagnoses are listed below: Family History  Problem Relation Age of Onset   April Burns Maternal Grandfather        dx 46s    Ms. Aziz is unaware of previous family history of genetic testing for hereditary Burns risks. There is no reported Ashkenazi Jewish ancestry. There is no known consanguinity.  GENETIC COUNSELING ASSESSMENT: April Burns is a 88 y.o. female with a personal history of Burns which is somewhat suggestive of a hereditary Burns syndrome and predisposition to Burns given her triple negative status. We, therefore, discussed and recommended the following at today's visit.   DISCUSSION: We discussed that 5 - 10% of Burns is hereditary.  Most cases of hereditary breast Burns are associated with mutations in BRCA1/2.  There are other genes that can be associated with hereditary breast Burns syndromes.  We discussed that testing is beneficial for several reasons including understanding if other family members could be at risk for Burns and allowing them to undergo genetic testing.   We reviewed the characteristics, features and inheritance patterns of hereditary Burns syndromes. We also discussed genetic testing, including the appropriate family members to test, the process of testing, insurance coverage and turn-around-time for results. We discussed the implications of a negative, positive, carrier and/or variant of uncertain significant result. We recommended April Burns pursue genetic testing for a panel that includes genes associated with breast Burns, melanoma, and other cancers.   The CancerNext-Expanded gene panel offered by [facility] and includes sequencing, rearrangement, and RNA analysis for the following 77 genes: AIP, ALK, APC, ATM, AXIN2, BAP1, BARD1, BLM, BMPR1A, BRCA1, BRCA2, BRIP1, CDC73, CDH1, CDK4, CDKN1B, CDKN2A, CHEK2, CTNNA1, DICER1, FANCC, FH, FLCN, GALNT12, KIF1B, LZTR1, MAX, MEN1, MET, MLH1, MSH2, MSH3, MSH6, MUTYH, NBN, NF1, NF2, NTHL1,  PALB2, PHOX2B, PMS2, POT1, PRKAR1A, PTCH1, PTEN, RAD51C, RAD51D, RB1, RECQL, RET, SDHA, SDHAF2, SDHB, SDHC, SDHD, SMAD4, SMARCA4, SMARCB1, SMARCE1, STK11, SUFU, TMEM127, TP53, TSC1, TSC2, VHL and XRCC2 (sequencing and deletion/duplication); EGFR, EGLN1, HOXB13, KIT, MITF, PDGFRA, POLD1, and POLE (sequencing only); EPCAM and GREM1 (deletion/duplication only).   Based on Ms. Foskey's personal history of Burns, she meets medical criteria for genetic testing.   PLAN: After considering the risks, benefits, and limitations, Ms. Bebee provided informed consent to pursue genetic testing and the blood sample was sent to [facility] for analysis of the CancerNext-Expanded +RNAinsight Panel. Results should be available within approximately 3 weeks, at which point they will be disclosed by telephone to Ms. [place], as will any additional recommendations warranted by these results. Ms. Leisinger requested that we call her daughter Charlie's number [phone]) with results, as they will be together on the [facility] when results return. Ms. Wildermuth will receive a summary of her genetic counseling visit and a copy of her results once available. This information will also be available in Epic.   Lastly, we encouraged Ms. Lasseigne to remain in contact with Burns genetics so that we  can continuously update the family history and inform her of any changes in Burns genetics and testing that may be of benefit for this family.   Ms. Bump questions were answered to her satisfaction today. Our contact information was provided should additional questions or concerns arise. Thank you for the referral and allowing [place] to share in the care of your patient.   Ivey Cina M. Joette Catching, [place], [facility] Genetic Counselor [email or web address] [phone]  The patient was seen for a total of 15 minutes in face-to-face genetic counseling.  The patient was accompanied by her daughter, [name].  Drs. Lindi Adie  and/or Burr Medico were available to discuss this case as needed.    _______________________________________________________________________ For Office Staff:  Number of people involved in session: 2 Was an Intern/ student involved with case: no

## 2022-05-18 ENCOUNTER — Encounter (HOSPITAL_BASED_OUTPATIENT_CLINIC_OR_DEPARTMENT_OTHER)
Admission: RE | Admit: 2022-05-18 | Discharge: 2022-05-18 | Disposition: A | Discharge status: Home / Self Care | Payer: Medicare Other | Source: Ambulatory Visit | Attending: Surgery | Admitting: Surgery

## 2022-05-18 DIAGNOSIS — Z01810 Encounter for preprocedural cardiovascular examination: Secondary | ICD-10-CM | POA: Insufficient documentation

## 2022-05-18 DIAGNOSIS — Z171 Estrogen receptor negative status [ER-]: Secondary | ICD-10-CM | POA: Diagnosis not present

## 2022-05-18 DIAGNOSIS — C50412 Malignant neoplasm of upper-outer quadrant of left female breast: Secondary | ICD-10-CM | POA: Diagnosis not present

## 2022-05-18 NOTE — Progress Notes (Signed)

## 2022-05-22 DIAGNOSIS — C50412 Malignant neoplasm of upper-outer quadrant of left female breast: Secondary | ICD-10-CM | POA: Diagnosis not present

## 2022-05-23 ENCOUNTER — Ambulatory Visit (HOSPITAL_BASED_OUTPATIENT_CLINIC_OR_DEPARTMENT_OTHER)
Admission: RE | Admit: 2022-05-23 | Discharge: 2022-05-23 | Disposition: A | Discharge status: Home / Self Care | Payer: Medicare Other | Attending: Surgery | Admitting: Surgery

## 2022-05-23 ENCOUNTER — Encounter (HOSPITAL_BASED_OUTPATIENT_CLINIC_OR_DEPARTMENT_OTHER)
Admission: RE | Disposition: A | Discharge status: Home / Self Care | Payer: Self-pay | Source: Home / Self Care | Attending: Surgery

## 2022-05-23 ENCOUNTER — Other Ambulatory Visit: Payer: Self-pay

## 2022-05-23 ENCOUNTER — Encounter (HOSPITAL_BASED_OUTPATIENT_CLINIC_OR_DEPARTMENT_OTHER): Payer: Self-pay | Admitting: Surgery

## 2022-05-23 ENCOUNTER — Ambulatory Visit (HOSPITAL_BASED_OUTPATIENT_CLINIC_OR_DEPARTMENT_OTHER): Payer: Medicare Other | Admitting: Anesthesiology

## 2022-05-23 DIAGNOSIS — N6042 Mammary duct ectasia of left breast: Secondary | ICD-10-CM | POA: Diagnosis not present

## 2022-05-23 DIAGNOSIS — Z87891 Personal history of nicotine dependence: Secondary | ICD-10-CM | POA: Diagnosis not present

## 2022-05-23 DIAGNOSIS — Z79899 Other long term (current) drug therapy: Secondary | ICD-10-CM | POA: Insufficient documentation

## 2022-05-23 DIAGNOSIS — C50412 Malignant neoplasm of upper-outer quadrant of left female breast: Secondary | ICD-10-CM | POA: Insufficient documentation

## 2022-05-23 DIAGNOSIS — N183 Chronic kidney disease, stage 3 unspecified: Secondary | ICD-10-CM | POA: Diagnosis not present

## 2022-05-23 DIAGNOSIS — N6320 Unspecified lump in the left breast, unspecified quadrant: Secondary | ICD-10-CM | POA: Diagnosis not present

## 2022-05-23 DIAGNOSIS — K219 Gastro-esophageal reflux disease without esophagitis: Secondary | ICD-10-CM | POA: Diagnosis not present

## 2022-05-23 DIAGNOSIS — M810 Age-related osteoporosis without current pathological fracture: Secondary | ICD-10-CM | POA: Diagnosis not present

## 2022-05-23 DIAGNOSIS — I452 Bifascicular block: Secondary | ICD-10-CM | POA: Insufficient documentation

## 2022-05-23 DIAGNOSIS — I6529 Occlusion and stenosis of unspecified carotid artery: Secondary | ICD-10-CM | POA: Diagnosis not present

## 2022-05-23 DIAGNOSIS — J449 Chronic obstructive pulmonary disease, unspecified: Secondary | ICD-10-CM | POA: Diagnosis not present

## 2022-05-23 DIAGNOSIS — E782 Mixed hyperlipidemia: Secondary | ICD-10-CM | POA: Diagnosis not present

## 2022-05-23 DIAGNOSIS — Z171 Estrogen receptor negative status [ER-]: Secondary | ICD-10-CM | POA: Insufficient documentation

## 2022-05-23 DIAGNOSIS — I10 Essential (primary) hypertension: Secondary | ICD-10-CM | POA: Insufficient documentation

## 2022-05-23 DIAGNOSIS — G4700 Insomnia, unspecified: Secondary | ICD-10-CM | POA: Diagnosis not present

## 2022-05-23 DIAGNOSIS — N6032 Fibrosclerosis of left breast: Secondary | ICD-10-CM | POA: Diagnosis not present

## 2022-05-23 DIAGNOSIS — G2581 Restless legs syndrome: Secondary | ICD-10-CM | POA: Diagnosis not present

## 2022-05-23 DIAGNOSIS — C50912 Malignant neoplasm of unspecified site of left female breast: Secondary | ICD-10-CM

## 2022-05-23 DIAGNOSIS — N6022 Fibroadenosis of left breast: Secondary | ICD-10-CM | POA: Diagnosis not present

## 2022-05-23 DIAGNOSIS — I700 Atherosclerosis of aorta: Secondary | ICD-10-CM | POA: Diagnosis not present

## 2022-05-23 DIAGNOSIS — E559 Vitamin D deficiency, unspecified: Secondary | ICD-10-CM | POA: Diagnosis not present

## 2022-05-23 DIAGNOSIS — M109 Gout, unspecified: Secondary | ICD-10-CM | POA: Diagnosis not present

## 2022-05-23 HISTORY — PX: BREAST LUMPECTOMY WITH RADIOACTIVE SEED LOCALIZATION: SHX6424

## 2022-05-23 HISTORY — DX: Chronic kidney disease, unspecified: N18.9

## 2022-05-23 HISTORY — DX: Gastro-esophageal reflux disease without esophagitis: K21.9

## 2022-05-23 HISTORY — DX: Anemia, unspecified: D64.9

## 2022-05-23 SURGERY — BREAST LUMPECTOMY WITH RADIOACTIVE SEED LOCALIZATION
Anesthesia: General | Site: Breast | Laterality: Left

## 2022-05-23 MED ORDER — CEFAZOLIN SODIUM-DEXTROSE 2-4 GM/100ML-% IV SOLN
INTRAVENOUS | Status: AC
  Filled 2022-05-23: qty 100

## 2022-05-23 MED ORDER — EPHEDRINE SULFATE (PRESSORS) 50 MG/ML IJ SOLN
INTRAMUSCULAR | Status: DC | PRN
  Administered 2022-05-23 (×2): 10 mg via INTRAVENOUS
  Administered 2022-05-23: 5 mg via INTRAVENOUS

## 2022-05-23 MED ORDER — BUPIVACAINE-EPINEPHRINE (PF) 0.25% -1:200000 IJ SOLN
INTRAMUSCULAR | Status: DC | PRN
Start: 2022-05-23 — End: 2022-05-23
  Administered 2022-05-23: 20 mL

## 2022-05-23 MED ORDER — CHLORHEXIDINE GLUCONATE CLOTH 2 % EX PADS: 6.0000 | MEDICATED_PAD | Freq: Once | CUTANEOUS | Status: DC

## 2022-05-23 MED ORDER — FENTANYL CITRATE (PF) 100 MCG/2ML IJ SOLN: 25.0000 ug | INTRAMUSCULAR | Status: DC | PRN

## 2022-05-23 MED ORDER — EPHEDRINE 5 MG/ML INJ
INTRAVENOUS | Status: AC
  Filled 2022-05-23: qty 5

## 2022-05-23 MED ORDER — FENTANYL CITRATE (PF) 100 MCG/2ML IJ SOLN
INTRAMUSCULAR | Status: AC
  Filled 2022-05-23: qty 2

## 2022-05-23 MED ORDER — ACETAMINOPHEN 500 MG PO TABS
1000.0000 mg | ORAL_TABLET | Freq: Once | ORAL | Status: AC
  Administered 2022-05-23: 1000 mg via ORAL

## 2022-05-23 MED ORDER — PROPOFOL 10 MG/ML IV BOLUS
INTRAVENOUS | Status: DC | PRN
Start: 2022-05-23 — End: 2022-05-23
  Administered 2022-05-23: 90 mg via INTRAVENOUS

## 2022-05-23 MED ORDER — PROPOFOL 10 MG/ML IV BOLUS
INTRAVENOUS | Status: AC
  Filled 2022-05-23: qty 20

## 2022-05-23 MED ORDER — OXYCODONE HCL 5 MG PO TABS: 5.0000 mg | ORAL_TABLET | Freq: Four times a day (QID) | ORAL | 0 refills | Status: AC | PRN

## 2022-05-23 MED ORDER — OXYCODONE HCL 5 MG/5ML PO SOLN: 5.0000 mg | Freq: Once | ORAL | Status: DC | PRN

## 2022-05-23 MED ORDER — FENTANYL CITRATE (PF) 100 MCG/2ML IJ SOLN
INTRAMUSCULAR | Status: DC | PRN
  Administered 2022-05-23 (×2): 25 ug via INTRAVENOUS

## 2022-05-23 MED ORDER — 0.9 % SODIUM CHLORIDE (POUR BTL) OPTIME
TOPICAL | Status: DC | PRN
  Administered 2022-05-23: 1000 mL

## 2022-05-23 MED ORDER — OXYCODONE HCL 5 MG PO TABS: 5.0000 mg | ORAL_TABLET | Freq: Once | ORAL | Status: DC | PRN

## 2022-05-23 MED ORDER — ONDANSETRON HCL 4 MG/2ML IJ SOLN
INTRAMUSCULAR | Status: AC
  Filled 2022-05-23: qty 2

## 2022-05-23 MED ORDER — LACTATED RINGERS IV SOLN: INTRAVENOUS | Status: DC

## 2022-05-23 MED ORDER — CEFAZOLIN SODIUM-DEXTROSE 2-4 GM/100ML-% IV SOLN
2.0000 g | INTRAVENOUS | Status: AC
  Administered 2022-05-23: 2 g via INTRAVENOUS

## 2022-05-23 MED ORDER — ONDANSETRON HCL 4 MG/2ML IJ SOLN
INTRAMUSCULAR | Status: DC | PRN
  Administered 2022-05-23: 4 mg via INTRAVENOUS

## 2022-05-23 MED ORDER — DEXAMETHASONE SODIUM PHOSPHATE 10 MG/ML IJ SOLN
INTRAMUSCULAR | Status: DC | PRN
  Administered 2022-05-23: 5 mg via INTRAVENOUS

## 2022-05-23 MED ORDER — ONDANSETRON HCL 4 MG/2ML IJ SOLN: 4.0000 mg | Freq: Four times a day (QID) | INTRAMUSCULAR | Status: DC | PRN

## 2022-05-23 MED ORDER — ACETAMINOPHEN 500 MG PO TABS
ORAL_TABLET | ORAL | Status: AC
  Filled 2022-05-23: qty 2

## 2022-05-23 MED ORDER — LIDOCAINE HCL (CARDIAC) PF 100 MG/5ML IV SOSY
PREFILLED_SYRINGE | INTRAVENOUS | Status: DC | PRN
  Administered 2022-05-23: 60 mg via INTRATRACHEAL

## 2022-05-23 SURGICAL SUPPLY — 46 items
ADH SKN CLS APL DERMABOND .7 (GAUZE/BANDAGES/DRESSINGS) ×1
APL PRP STRL LF DISP 70% ISPRP (MISCELLANEOUS) ×1
APPLIER CLIP 9.375 MED OPEN (MISCELLANEOUS) ×2
APR CLP MED 9.3 20 MLT OPN (MISCELLANEOUS) ×1
BINDER BREAST MEDIUM (GAUZE/BANDAGES/DRESSINGS) ×1 IMPLANT
BLADE SURG 15 STRL LF DISP TIS (BLADE) ×1 IMPLANT
BLADE SURG 15 STRL SS (BLADE) ×2
CANISTER SUC SOCK COL 7IN (MISCELLANEOUS) IMPLANT
CANISTER SUCT 1200ML W/VALVE (MISCELLANEOUS) IMPLANT
CHLORAPREP W/TINT 26 (MISCELLANEOUS) ×2 IMPLANT
CLIP APPLIE 9.375 MED OPEN (MISCELLANEOUS) IMPLANT
COVER BACK TABLE 60X90IN (DRAPES) ×2 IMPLANT
COVER MAYO STAND STRL (DRAPES) ×2 IMPLANT
COVER PROBE W GEL 5X96 (DRAPES) ×2 IMPLANT
DERMABOND ADVANCED (GAUZE/BANDAGES/DRESSINGS) ×1
DERMABOND ADVANCED .7 DNX12 (GAUZE/BANDAGES/DRESSINGS) ×1 IMPLANT
DRAPE LAPAROTOMY 100X72 PEDS (DRAPES) ×2 IMPLANT
DRAPE UTILITY XL STRL (DRAPES) ×2 IMPLANT
ELECT COATED BLADE 2.86 ST (ELECTRODE) ×2 IMPLANT
ELECT REM PT RETURN 9FT ADLT (ELECTROSURGICAL) ×2
ELECTRODE REM PT RTRN 9FT ADLT (ELECTROSURGICAL) ×1 IMPLANT
GLOVE BIOGEL PI IND STRL 8 (GLOVE) ×1 IMPLANT
GLOVE BIOGEL PI INDICATOR 8 (GLOVE) ×1
GLOVE ECLIPSE 8.0 STRL XLNG CF (GLOVE) ×2 IMPLANT
GOWN STRL REUS W/ TWL LRG LVL3 (GOWN DISPOSABLE) ×2 IMPLANT
GOWN STRL REUS W/ TWL XL LVL3 (GOWN DISPOSABLE) ×1 IMPLANT
GOWN STRL REUS W/TWL LRG LVL3 (GOWN DISPOSABLE) ×4
GOWN STRL REUS W/TWL XL LVL3 (GOWN DISPOSABLE) ×2
HEMOSTAT ARISTA ABSORB 3G PWDR (HEMOSTASIS) IMPLANT
HEMOSTAT SNOW SURGICEL 2X4 (HEMOSTASIS) IMPLANT
KIT MARKER MARGIN INK (KITS) ×2 IMPLANT
NDL HYPO 25X1 1.5 SAFETY (NEEDLE) ×1 IMPLANT
NEEDLE HYPO 25X1 1.5 SAFETY (NEEDLE) ×2 IMPLANT
NS IRRIG 1000ML POUR BTL (IV SOLUTION) ×2 IMPLANT
PACK BASIN DAY SURGERY FS (CUSTOM PROCEDURE TRAY) ×2 IMPLANT
PENCIL SMOKE EVACUATOR (MISCELLANEOUS) ×2 IMPLANT
SLEEVE SCD COMPRESS KNEE MED (STOCKING) ×2 IMPLANT
SPONGE T-LAP 4X18 ~~LOC~~+RFID (SPONGE) ×2 IMPLANT
SUT MNCRL AB 4-0 PS2 18 (SUTURE) ×2 IMPLANT
SUT SILK 2 0 SH (SUTURE) IMPLANT
SUT VICRYL 3-0 CR8 SH (SUTURE) ×2 IMPLANT
SYR CONTROL 10ML LL (SYRINGE) ×2 IMPLANT
TOWEL GREEN STERILE FF (TOWEL DISPOSABLE) ×2 IMPLANT
TRAY FAXITRON CT DISP (TRAY / TRAY PROCEDURE) ×2 IMPLANT
TUBE CONNECTING 20X1/4 (TUBING) ×1 IMPLANT
YANKAUER SUCT BULB TIP NO VENT (SUCTIONS) ×1 IMPLANT

## 2022-05-23 NOTE — Interval H&P Note (Signed)
History and Physical Interval Note:  05/23/2022 2:54 PM  April Burns  has presented today for surgery, with the diagnosis of LEFT BREAST CANCER.  The various methods of treatment have been discussed with the patient and family. After consideration of risks, benefits and other options for treatment, the patient has consented to  Procedure(s): LEFT BREAST LUMPECTOMY WITH RADIOACTIVE SEED LOCALIZATION (Left) as a surgical intervention.  The patient's history has been reviewed, patient examined, no change in status, stable for surgery.  I have reviewed the patient's chart and labs.  Questions were answered to the patient's satisfaction.     [place]

## 2022-05-23 NOTE — OR Nursing (Signed)
Right and left hearing aid placed in patient ears prior to leaving OR. [facility]u nurse notified hearing aids present and intact.

## 2022-05-23 NOTE — H&P (Signed)
History of Present Illness: April Burns is a 88 y.o. female who is seen today as an office consultation for evaluation of Breast Cancer .   Patient seen today in the [facility] for newly diagnosed left breast cancer. The patient noted a mass in her left breast upper outer quadrant about a month ago. Imaging as well as core biopsy revealed a 1.4 cm and 8.4 cm mass left breast upper outer quadrant adjacent. Core biopsy showed triple negative April Karen breast cancer. Denies any history of breast pain nipple discharge and the mass was noted only recently. She is quite active at her age. She plans on traveling to [place] state to spend the summer now and near [place].  Review of Systems: A complete review of systems was obtained from the patient. I have reviewed this information and discussed as appropriate with the patient. See HPI as well for other ROS.    Medical History: Past Medical History:  Diagnosis Date   Arthritis   History of cancer   Patient Active Problem List  Diagnosis   Malignant neoplasm of upper-outer quadrant of left breast in female, estrogen receptor negative (CMS-HCC)   Past Surgical History:  Procedure Laterality Date   CESAREAN SECTION  C section x 2   HYSTERECTOMY   Mole  Removed cancer mole from ankle    Allergies  Allergen Reactions   Levofloxacin Other (See Comments)   Neosporin [Neomycin-Bacitracin-Polymyxin] Dermatitis   Statins-Hmg-Coa Reductase Inhibitors Other (See Comments)  Myalgias and unsteady gait   Current Outpatient Medications on File Prior to Visit  Medication Sig Dispense Refill   acetaminophen (TYLENOL) 325 MG tablet Take 650 mg by mouth every 6 (six) hours as needed   allopurinoL (ZYLOPRIM) 100 MG tablet Take 200 mg by mouth once daily   amLODIPine-olmesartan (AZOR) 5-20 mg tablet 1 tablet   calcium carbonate-vitamin D3 (CALCIUM 600 + D,3,) 600 mg-5 mcg (200 unit) tablet 1 tablet   cloNIDine HCL (CATAPRES) 0.1 MG tablet 1 tablet    colchicine (COLCRYS) 0.6 mg tablet 1 tablet   ibandronate (BONIVA) 150 mg tablet Take 1 tablet by mouth monthly   indomethacin (INDOCIN) 50 MG capsule TAKE 1 CAPSULE BY MOUTH TWICE DAILY WITH FOOD OR MILK FOR 10 DAYS AS NEEDED FOR SEVERE INFLAMMATION OR GOUT   LASIX 20 mg tablet Take 20 mg by mouth once daily as needed   metoprolol succinate (TOPROL-XL) 25 MG XL tablet Take 25 mg by mouth once daily   pantoprazole (PROTONIX) 40 MG DR tablet 1 tablet at noon   No current facility-administered medications on file prior to visit.   Family History  Problem Relation Age of Onset   Obesity Father   Coronary Artery Disease (Blocked arteries around heart) Father    Social History   Tobacco Use  Smoking Status Former   Types: Cigarettes   Quit date: 1995   Years since quitting: 28.3  Smokeless Tobacco Never    Social History   Socioeconomic History   Marital status: Single  Tobacco Use   Smoking status: Former  Types: Cigarettes  Quit date: 1995  Years since quitting: 28.3   Smokeless tobacco: Never  Vaping Use   Vaping Use: Never used  Substance and Sexual Activity   Alcohol use: Never   Drug use: Never   Objective:  There were no vitals filed for this visit.  There is no height or weight on file to calculate BMI.  General appearance: Female appears younger than stated age  25:  Pupils symmetrical bilaterally. Trachea midline  Pulmonary: Lung sounds are clear  Chest: Both breasts are examined. Mass noted left breast upper outer quadrant with bruising. Both axilla are negative to examination today  Extremities no evidence of edema  Neuro: Alert and oriented x4 nonfocal  Psych: Mood normal. Mentation normal. Alert and oriented.  Labs, Imaging and Diagnostic Testing: Mammography from [facility] reviewed. There is a 1.4 cm mass as well as adjacent 0.4 cm mass left breast upper outer quadrant. Core biopsy showed apical and neoplasia triple negative.  Assessment and Plan:    Diagnoses and all orders for this visit:  Malignant neoplasm of upper-outer quadrant of left breast in female, estrogen receptor negative (CMS-HCC)    Patient in very good overall health. Discussed the nature of her breast cancer. She is scheduled to go to [place] state next week and has been 4 months as she does every year. I discussed the pros and cons of that approach and that she may need to seek care in the [facility] area since she will be out of her living for some period time and returning to [place]. I have offered her left breast lumpectomy. I discussed the nature of her cancer which is in a precluding cancer which can be locally aggressive at times. Her lymph node basin is normal and given her advanced age and the fact that her treatment will not be influenced all that much by lymph node mapping, I have offered to admit that. She is very healthy and very active and does not like the idea lymphedema. I reviewed the pros and cons of this approach. She was seen by radiation and medical oncology as well. The plan is to proceed with left breast seed localized lumpectomy if we can prior to departure and she may need to seek care at [facility] as long as she is out there. If this is not able to be scheduled, she can seek care out [place] when she arrives week after next. Risk lumpectomy discussed to include but not exclusive of bleeding, infection, cosmetic deformity, the need for reexcision, and the need for the treatments and/or procedures.  No follow-ups on file.  Kennieth Francois, MD

## 2022-05-23 NOTE — Discharge Instructions (Addendum)
Post Anesthesia Home Care Instructions  Activity: Get plenty of rest for the remainder of the day. A responsible individual must stay with you for 24 hours following the procedure.  For the next 24 hours, DO NOT: -Drive a car -Paediatric nurse -Drink alcoholic beverages -Take any medication unless instructed by your physician -Make any legal decisions or sign important papers.  Meals: Start with liquid foods such as gelatin or soup. Progress to regular foods as tolerated. Avoid greasy, spicy, heavy foods. If nausea and/or vomiting occur, drink only clear liquids until the nausea and/or vomiting subsides. Call your physician if vomiting continues.  Special Instructions/Symptoms: Your throat may feel dry or sore from the anesthesia or the breathing tube placed in your throat during surgery. If this causes discomfort, gargle with warm salt water. The discomfort should disappear within 24 hours.  If you had a scopolamine patch placed behind your ear for the management of post- operative nausea and/or vomiting:  1. The medication in the patch is effective for 72 hours, after which it should be removed.  Wrap patch in a tissue and discard in the trash. Wash hands thoroughly with soap and water. 2. You may remove the patch earlier than 72 hours if you experience unpleasant side effects which may include dry mouth, dizziness or visual disturbances. 3. Avoid touching the patch. Wash your hands with soap and water after contact with the patch.    [place] Office Phone Number [phone]  BREAST BIOPSY/ PARTIAL MASTECTOMY: POST OP INSTRUCTIONS  Always review your discharge instruction sheet given to you by the facility where your surgery was performed.  IF YOU HAVE DISABILITY OR FAMILY LEAVE FORMS, YOU MUST BRING THEM TO THE OFFICE FOR PROCESSING.  DO NOT GIVE THEM TO YOUR DOCTOR.  A prescription for pain medication may be given to you upon discharge.  Take your pain  medication as prescribed, if needed.  If narcotic pain medicine is not needed, then you may take acetaminophen (Tylenol) or ibuprofen (Advil) as needed. Take your usually prescribed medications unless otherwise directed If you need a refill on your pain medication, please contact your pharmacy.  They will contact our office to request authorization.  Prescriptions will not be filled after 5pm or on week-ends. You should eat very light the first 24 hours after surgery, such as soup, crackers, pudding, etc.  Resume your normal diet the day after surgery. Most patients will experience some swelling and bruising in the breast.  Ice packs and a good support bra will help.  Swelling and bruising can take several days to resolve.  It is common to experience some constipation if taking pain medication after surgery.  Increasing fluid intake and taking a stool softener will usually help or prevent this problem from occurring.  A mild laxative (Milk of Magnesia or Miralax) should be taken according to package directions if there are no bowel movements after 48 hours. Unless discharge instructions indicate otherwise, you may remove your bandages 24-48 hours after surgery, and you may shower at that time.  You may have steri-strips (small skin tapes) in place directly over the incision.  These strips should be left on the skin for 7-10 days.  If your surgeon used skin glue on the incision, you may shower in 24 hours.  The glue will flake off over the next 2-3 weeks.  Any sutures or staples will be removed at the office during your follow-up visit. ACTIVITIES:  You may resume regular daily activities (gradually increasing)  beginning the next day.  Wearing a good support bra or sports bra minimizes pain and swelling.  You may have sexual intercourse when it is comfortable. You may drive when you no longer are taking prescription pain medication, you can comfortably wear a seatbelt, and you can safely maneuver your car and  apply brakes. RETURN TO WORK:  ______________________________________________________________________________________ Dennis Bast should see your doctor in the office for a follow-up appointment approximately two weeks after your surgery.  Your doctor's nurse will typically make your follow-up appointment when she calls you with your pathology report.  Expect your pathology report 2-3 business days after your surgery.  You may call to check if you do not hear from [place] after three days. OTHER INSTRUCTIONS: _______________________________________________________________________________________________ _____________________________________________________________________________________________________________________________________ _____________________________________________________________________________________________________________________________________ _____________________________________________________________________________________________________________________________________  WHEN TO CALL YOUR DOCTOR: Fever over 101.0 Nausea and/or vomiting. Extreme swelling or bruising. Continued bleeding from incision. Increased pain, redness, or drainage from the incision.  The clinic staff is available to answer your questions during regular business hours.  Please don't hesitate to call and ask to speak to one of the nurses for clinical concerns.  If you have a medical emergency, go to the nearest emergency room or call 911.  A surgeon from [facility] is always on call at the hospital.  For further questions, please visit [email or web address]       Post Anesthesia Home Care Instructions  Activity: Get plenty of rest for the remainder of the day. A responsible individual must stay with you for 24 hours following the procedure.  For the next 24 hours, DO NOT: -Drive a car -Paediatric nurse -Drink alcoholic beverages -Take any medication unless instructed by your  physician -Make any legal decisions or sign important papers.  Meals: Start with liquid foods such as gelatin or soup. Progress to regular foods as tolerated. Avoid greasy, spicy, heavy foods. If nausea and/or vomiting occur, drink only clear liquids until the nausea and/or vomiting subsides. Call your physician if vomiting continues.  Special Instructions/Symptoms: Your throat may feel dry or sore from the anesthesia or the breathing tube placed in your throat during surgery. If this causes discomfort, gargle with warm salt water. The discomfort should disappear within 24 hours.  If you had a scopolamine patch placed behind your ear for the management of post- operative nausea and/or vomiting:  1. The medication in the patch is effective for 72 hours, after which it should be removed.  Wrap patch in a tissue and discard in the trash. Wash hands thoroughly with soap and water. 2. You may remove the patch earlier than 72 hours if you experience unpleasant side effects which may include dry mouth, dizziness or visual disturbances. 3. Avoid touching the patch. Wash your hands with soap and water after contact with the patch.      Call your surgeon if you experience:   1.  Fever over 101.0. 2.  Inability to urinate. 3.  Nausea and/or vomiting. 4.  Extreme swelling or bruising at the surgical site. 5.  Continued bleeding from the incision. 6.  Increased pain, redness or drainage from the incision. 7.  Problems related to your pain medication. 8.  Any problems and/or concerns

## 2022-05-23 NOTE — Anesthesia Procedure Notes (Signed)
Procedure Name: LMA Insertion Date/Time: 05/23/2022 3:27 PM Performed by: Glory Buff, CRNA Pre-anesthesia Checklist: Patient identified, Emergency Drugs available, Suction available and Patient being monitored Patient Re-evaluated:Patient Re-evaluated prior to induction Oxygen Delivery Method: Circle system utilized Preoxygenation: Pre-oxygenation with 100% oxygen Induction Type: IV induction LMA: LMA inserted LMA Size: 3.0 Number of attempts: 1 Placement Confirmation: positive ETCO2 Tube secured with: Tape Dental Injury: Teeth and Oropharynx as per pre-operative assessment

## 2022-05-23 NOTE — Anesthesia Preprocedure Evaluation (Addendum)
Anesthesia Evaluation  Patient identified by MRN, date of birth, ID band Patient awake    Reviewed: Allergy & Precautions, H&P , NPO status , Patient's Chart, lab work & pertinent test results, reviewed documented beta blocker date and time   Airway Mallampati: II   Neck ROM: full    Dental   Pulmonary neg pulmonary ROS, former smoker,    breath sounds clear to auscultation       Cardiovascular hypertension, Pt. on medications and Pt. on home beta blockers  Rhythm:regular Rate:Normal     Neuro/Psych negative neurological ROS  negative psych ROS   GI/Hepatic Neg liver ROS, GERD  Controlled and Medicated,  Endo/Other  negative endocrine ROS  Renal/GU Renal InsufficiencyRenal disease  negative genitourinary   Musculoskeletal  (+) Arthritis ,   Abdominal   Peds  Hematology negative hematology ROS (+)   Anesthesia Other Findings Left breast CA  Reproductive/Obstetrics                            Anesthesia Physical Anesthesia Plan  ASA: 2  Anesthesia Plan: General   Post-op Pain Management: Tylenol PO (pre-op)*   Induction: Intravenous  PONV Risk Score and Plan: 3 and Ondansetron, Dexamethasone and Treatment may vary due to age or medical condition  Airway Management Planned: LMA  Additional Equipment:   Intra-op Plan:   Post-operative Plan: Extubation in OR  Informed Consent: I have reviewed the patients History and Physical, chart, labs and discussed the procedure including the risks, benefits and alternatives for the proposed anesthesia with the patient or authorized representative who has indicated his/her understanding and acceptance.     Dental advisory given  Plan Discussed with: CRNA, Anesthesiologist and Surgeon  Anesthesia Plan Comments:        Anesthesia Quick Evaluation

## 2022-05-23 NOTE — Op Note (Signed)
Preoperative diagnosis: Stage I left breast cancer upper outer quadrant  Postoperative diagnosis: Same  Procedure: Left breast seed localized lumpectomy  Surgeon: Erroll Luna, MD  Anesthesia: LMA with 0.25% Marcaine with epinephrine  EBL: 10 cc  Drains: None  Specimen: Left breast tissue with 2 seeds and 2 clips verified by the Faxitron machine and additional margins which were oriented with ink and sent separately.  All margins of the cavity were excised circumferentially.  IV fluids: Per anesthesia record  Indications for procedure: The patient is a pleasant 88 year old female stage I left breast cancer.  She is in excellent health.  She was seen in the multidisciplinary clinic and opted for breast conserving surgery.  Pros and cons of breast conserving surgery, discussion of local regional recurrence, discussion of recurrence and/or need for reexcision as well as the role of sentinel lymph node mapping discussed.  Given her low-grade tumor, small size and advanced age, we opted to forego sentinel lymph node mapping.The procedure has been discussed with the patient. Alternatives to surgery have been discussed with the patient.  Risks of surgery include bleeding,  Infection,  Seroma formation, death,  and the need for further surgery.   The patient understands and wishes to proceed.   Description of procedure: The patient was met in the holding area and questions were answered.  She had 2 seeds placed as an outpatient.  A second lesion adjacent to the first was noted that had not been biopsied but excision recommended since it was adjacent to the initial tumor.  The lesions were marked with 2 seeds to facilitate excision of this specimen en bloc.  These were marked preoperatively.  The place was placed upon the OR table.  After induction of general esthesia left breast was prepped and draped in sterile fashion timeout performed.  Films were available for review.  Proper patient, site and  procedure verified.  Neoprobe used to identify both signals which were marked on the skin with the radiologist.  A curvilinear incision was made in the left breast upper outer quadrant.  Dissection was carried down to encompass both lesions.  The specimen was removed en bloc.  The Faxitron image revealed both seeds and both clips to be present.  I then excised all margins circumferentially and these were all oriented with ink and sent to pathology.  The cavities made hemostatic with cautery.  Local anesthetic was infiltrated throughout the cavity.  Hemostasis was achieved.  Clips were used to mark the cavity for potential radiation therapy if necessary.  Cavity is closed with 3-0 Vicryl.  4 Monocryl used to approximate the skin in a subcuticular fashion.  Dermabond was applied.  All counts found to be correct.  Breast binder placed.  The patient was awoke extubated taken to recovery in satisfactory condition.

## 2022-05-23 NOTE — Transfer of Care (Signed)
Immediate Anesthesia Transfer of Care Note  Patient: April Burns  Procedure(s) Performed: LEFT BREAST LUMPECTOMY WITH RADIOACTIVE SEED LOCALIZATION (Left: Breast)  Patient Location: PACU  Anesthesia Type:General  Level of Consciousness: drowsy and patient cooperative  Airway & Oxygen Therapy: Patient Spontanous Breathing and Patient connected to face mask oxygen  Post-op Assessment: Report given to RN and Post -op Vital signs reviewed and stable  Post vital signs: Reviewed and stable  Last Vitals:  Vitals Value Taken Time  BP    Temp    Pulse 61 05/23/22 1621  Resp 11 05/23/22 1621  SpO2 100 % 05/23/22 1621  Vitals shown include unvalidated device data.  Last Pain:  Vitals:   05/23/22 1454  TempSrc: Oral  PainSc: 0-No pain         Complications: No notable events documented.

## 2022-05-24 ENCOUNTER — Encounter (HOSPITAL_BASED_OUTPATIENT_CLINIC_OR_DEPARTMENT_OTHER): Payer: Self-pay | Admitting: Surgery

## 2022-05-24 ENCOUNTER — Telehealth: Payer: Self-pay | Admitting: *Deleted

## 2022-05-24 ENCOUNTER — Encounter: Payer: Self-pay | Admitting: *Deleted

## 2022-05-24 NOTE — Telephone Encounter (Signed)
Spoke with patient to follow up from [facility] 5/17 and assess navigation needs. Patient denies any questions or concerns at this time.  She states she is doing well after sx with no complaints. Encouraged her to call should anything arise. Patient verbalized understanding.

## 2022-05-24 NOTE — Anesthesia Postprocedure Evaluation (Signed)
Anesthesia Post Note  Patient: April Burns  Procedure(s) Performed: LEFT BREAST LUMPECTOMY WITH RADIOACTIVE SEED LOCALIZATION (Left: Breast)     Patient location during evaluation: PACU Anesthesia Type: General Level of consciousness: awake and alert Pain management: pain level controlled Vital Signs Assessment: post-procedure vital signs reviewed and stable Respiratory status: spontaneous breathing, nonlabored ventilation, respiratory function stable and patient connected to nasal cannula oxygen Cardiovascular status: blood pressure returned to baseline and stable Postop Assessment: no apparent nausea or vomiting Anesthetic complications: no   No notable events documented.  Last Vitals:  Vitals:   05/23/22 1630 05/23/22 1658  BP:  (!) 145/48  Pulse: (!) 59 64  Resp: 19 16  Temp:  36.4 C  SpO2: 99% 98%    Last Pain:  Vitals:   05/23/22 1658  TempSrc:   PainSc: 0-No pain                 Layani Foronda S

## 2022-05-25 LAB — SURGICAL PATHOLOGY

## 2022-05-29 ENCOUNTER — Encounter: Payer: Self-pay | Admitting: *Deleted

## 2022-06-04 ENCOUNTER — Encounter: Payer: Self-pay | Admitting: Genetic Counselor

## 2022-06-04 ENCOUNTER — Ambulatory Visit: Payer: Self-pay | Admitting: Genetic Counselor

## 2022-06-04 ENCOUNTER — Telehealth: Payer: Self-pay | Admitting: Genetic Counselor

## 2022-06-04 DIAGNOSIS — C50412 Malignant neoplasm of upper-outer quadrant of left female breast: Secondary | ICD-10-CM

## 2022-06-04 DIAGNOSIS — Z1379 Encounter for other screening for genetic and chromosomal anomalies: Secondary | ICD-10-CM | POA: Insufficient documentation

## 2022-06-04 DIAGNOSIS — Z800 Family history of malignant neoplasm of digestive organs: Secondary | ICD-10-CM

## 2022-06-04 NOTE — Progress Notes (Signed)
HPI:   April Burns was previously seen in the [place] clinic due to a personal history of breast cancer and concerns regarding a hereditary predisposition to cancer. Please refer to our prior cancer genetics clinic note for more information regarding our discussion, assessment and recommendations, at the time. April Burns recent genetic test results were disclosed to her daughter, [name] (per her request), as were recommendations warranted by these results. These results and recommendations are discussed in more detail below.   CANCER HISTORY:      Oncology History Overview Note    Cancer Staging  Malignant neoplasm of upper-outer quadrant of left breast in female, estrogen receptor negative ([place]) Staging form: Breast, AJCC 8th Edition - Clinical stage from 05/09/2022: cT1c, cN0, cM0, GX, ER-, PR-, HER2- - Signed by Truitt Merle, MD on 05/15/2022      Malignant neoplasm of upper-outer quadrant of left breast in female, estrogen receptor negative ([place])   05/01/2022 Imaging     Exam: Breast Ultrasound - Limited Unilateral - L   CLINICAL: Patient noticed a lump 2 weeks ago in the - Left Breast Upper Outer Quadrant   IMPRESSION: The 1.3 x 1 x 1.3 cm lobulated mass in the left breast is highly suggestive of malignancy.     05/09/2022 Cancer Staging     Staging form: Breast, AJCC 8th Edition - Clinical stage from 05/09/2022: Stage IB (cT1c, cN0, cM0, G2, ER-, PR-, HER2-) - Signed by Truitt Merle, MD on 05/16/2022 Stage prefix: Initial diagnosis Nuclear grade: G2 Histologic grading system: 3 grade system       05/09/2022 Initial Biopsy     Diagnosis Breast, left, needle core biopsy, 1:00 4 cmfn - INVASIVE APOCRINE CARCINOMA. - SEE NOTE. Diagnosis Note The greatest tumor dimension is 1.1 cm.   Addendum: Immunohistochemistry for androgen receptor is strongly positive which supports the diagnosis.   PROGNOSTIC INDICATORS Results: The tumor cells are EQUIVOCAL for Her2  (2+). Her2 by FISH will be performed and the results reported separately. Estrogen Receptor: 0%, NEGATIVE Progesterone Receptor: 0%, NEGATIVE Proliferation Marker Ki67: 10%   FLUORESCENCE IN-SITU HYBRIDIZATION Results: GROUP 5: HER2 **NEGATIVE**     05/14/2022 Initial Diagnosis     Malignant neoplasm of upper-outer quadrant of left breast in female, estrogen receptor negative ([place])       05/30/2022 Genetic Testing     Negative hereditary cancer genetic testing: no pathogenic variants detected in [facility] CancerNext-Expanded +RNAinsight Panel.  Report date is May 30, 2022.    The CancerNext-Expanded gene panel offered by [facility] and includes sequencing, rearrangement, and RNA analysis for the following 77 genes: AIP, ALK, APC, ATM, AXIN2, BAP1, BARD1, BLM, BMPR1A, BRCA1, BRCA2, BRIP1, CDC73, CDH1, CDK4, CDKN1B, CDKN2A, CHEK2, CTNNA1, DICER1, FANCC, FH, FLCN, GALNT12, KIF1B, LZTR1, MAX, MEN1, MET, MLH1, MSH2, MSH3, MSH6, MUTYH, NBN, NF1, NF2, NTHL1, PALB2, PHOX2B, PMS2, POT1, PRKAR1A, PTCH1, PTEN, RAD51C, RAD51D, RB1, RECQL, RET, SDHA, SDHAF2, SDHB, SDHC, SDHD, SMAD4, SMARCA4, SMARCB1, SMARCE1, STK11, SUFU, TMEM127, TP53, TSC1, TSC2, VHL and XRCC2 (sequencing and deletion/duplication); EGFR, EGLN1, HOXB13, KIT, MITF, PDGFRA, POLD1, and POLE (sequencing only); EPCAM and GREM1 (deletion/duplication only).          FAMILY HISTORY:  We obtained a detailed, 4-generation family history.  Significant diagnoses are listed below:      Family History  Problem Relation Age of Onset   Gastric cancer Maternal Grandfather          dx 27s    Ms. Ouderkirk is unaware of  previous family history of genetic testing for hereditary cancer risks. There is no reported Ashkenazi Jewish ancestry. There is no known consanguinity.     GENETIC TEST RESULTS:  The [facility] CancerNext-Expanded +RNAinsight Panel found no pathogenic mutations.    The CancerNext-Expanded gene panel offered by [facility] and  includes sequencing, rearrangement, and RNA analysis for the following 77 genes: AIP, ALK, APC, ATM, AXIN2, BAP1, BARD1, BLM, BMPR1A, BRCA1, BRCA2, BRIP1, CDC73, CDH1, CDK4, CDKN1B, CDKN2A, CHEK2, CTNNA1, DICER1, FANCC, FH, FLCN, GALNT12, KIF1B, LZTR1, MAX, MEN1, MET, MLH1, MSH2, MSH3, MSH6, MUTYH, NBN, NF1, NF2, NTHL1, PALB2, PHOX2B, PMS2, POT1, PRKAR1A, PTCH1, PTEN, RAD51C, RAD51D, RB1, RECQL, RET, SDHA, SDHAF2, SDHB, SDHC, SDHD, SMAD4, SMARCA4, SMARCB1, SMARCE1, STK11, SUFU, TMEM127, TP53, TSC1, TSC2, VHL and XRCC2 (sequencing and deletion/duplication); EGFR, EGLN1, HOXB13, KIT, MITF, PDGFRA, POLD1, and POLE (sequencing only); EPCAM and GREM1 (deletion/duplication only).    The test report has been scanned into EPIC and is located under the Molecular Pathology section of the Results Review tab.  A portion of the result report is included below for reference. Genetic testing reported out on May 30, 2022.      Even though a pathogenic variant was not identified, possible explanations for the cancer in the family may include: There may be no hereditary risk for cancer in the family. The cancers in Ms. Shetterly and/or her family may be sporadic/familial or due to other genetic and environmental factors. There may be a gene mutation in one of these genes that current testing methods cannot detect but that chance is small. There could be another gene that has not yet been discovered, or that we have not yet tested, that is responsible for the cancer diagnoses in the family.    Therefore, it is important to remain in touch with cancer genetics in the future so that we can continue to offer Ms. Streety the most up to date genetic testing.      ADDITIONAL GENETIC TESTING:  We discussed that her genetic testing was fairly extensive.  If there are additional relevant genes identified to increase cancer risk that can be analyzed in the future, we would be happy to discuss and coordinate this testing at that  time.       CANCER SCREENING RECOMMENDATIONS:  Ms. Hagwood test result is considered negative (normal).  This means that we have not identified a hereditary cause for her personal history of breast cancer at this time.    An individual's cancer risk and medical management are not determined by genetic test results alone. Overall cancer risk assessment incorporates additional factors, including personal medical history, family history, and any available genetic information that may result in a personalized plan for cancer prevention and surveillance. Therefore, it is recommended she continue to follow the cancer management and screening guidelines provided by her oncology and primary healthcare provider.   RECOMMENDATIONS FOR FAMILY MEMBERS:   Since she did not inherit a identifiable mutation in a cancer predisposition gene included on this panel, her children could not have inherited a known mutation from her in one of these genes. Individuals in this family might be at some increased risk of developing cancer, over the general population risk, due to the family history of cancer.  Individuals in the family should notify their providers of the family history of cancer. We recommend women in this family have a yearly mammogram beginning at age 66, or 56 years younger than the earliest onset of cancer, an annual clinical breast exam,  and perform monthly breast self-exams.       FOLLOW-UP:  Lastly, we discussed that cancer genetics is a rapidly advancing field and it is possible that new genetic tests will be appropriate for her and/or her family members in the future. We encouraged her to remain in contact with cancer genetics on an annual basis so we can update her personal and family histories and let her know of advances in cancer genetics that may benefit this family.    Our contact number was provided. Charlie's questions were answered to her satisfaction, and she knows she is welcome to call  us at anytime with additional questions or concerns.    Tilak Oakley M. Joette Catching, [place], [facility] Genetic Counselor [email or web address] (P) [phone]

## 2022-06-04 NOTE — Telephone Encounter (Addendum)
Revealed negative genetic testing to patient's daughter [name].  Discussed that we do not know why she has breast cancer. It could be sporadic/famillial, due to a different gene that we are not testing, or maybe our current technology may not be able to pick something up.  It will be important for her to keep in contact with genetics to keep up with whether additional testing may be needed.

## 2022-06-29 ENCOUNTER — Encounter (HOSPITAL_COMMUNITY): Payer: Self-pay

## 2022-07-25 ENCOUNTER — Telehealth: Payer: Self-pay | Admitting: Hematology

## 2022-07-25 NOTE — Telephone Encounter (Signed)
.  Called patient to schedule appointment per 5/19 inbasket, patient is aware of date and time.

## 2022-10-02 ENCOUNTER — Other Ambulatory Visit (HOSPITAL_BASED_OUTPATIENT_CLINIC_OR_DEPARTMENT_OTHER): Payer: Self-pay

## 2022-10-02 DIAGNOSIS — Z23 Encounter for immunization: Secondary | ICD-10-CM | POA: Diagnosis not present

## 2022-10-02 MED ORDER — AREXVY 120 MCG/0.5ML IM SUSR
INTRAMUSCULAR | 0 refills | Status: AC
Start: 2022-10-02 — End: ?
  Filled 2022-10-02: qty 0.5, 1d supply, fill #0

## 2022-10-02 MED ORDER — INFLUENZA VAC A&B SA ADJ QUAD 0.5 ML IM PRSY
PREFILLED_SYRINGE | INTRAMUSCULAR | 0 refills | Status: AC
  Filled 2022-10-02: qty 0.5, 1d supply, fill #0

## 2022-10-08 DIAGNOSIS — M5431 Sciatica, right side: Secondary | ICD-10-CM | POA: Diagnosis not present

## 2022-10-08 DIAGNOSIS — I10 Essential (primary) hypertension: Secondary | ICD-10-CM | POA: Diagnosis not present

## 2022-10-08 DIAGNOSIS — J449 Chronic obstructive pulmonary disease, unspecified: Secondary | ICD-10-CM | POA: Diagnosis not present

## 2022-10-08 DIAGNOSIS — I700 Atherosclerosis of aorta: Secondary | ICD-10-CM | POA: Diagnosis not present

## 2022-10-08 DIAGNOSIS — E611 Iron deficiency: Secondary | ICD-10-CM | POA: Diagnosis not present

## 2022-10-08 DIAGNOSIS — C50912 Malignant neoplasm of unspecified site of left female breast: Secondary | ICD-10-CM | POA: Diagnosis not present

## 2022-10-08 DIAGNOSIS — G4700 Insomnia, unspecified: Secondary | ICD-10-CM | POA: Diagnosis not present

## 2022-10-08 DIAGNOSIS — E559 Vitamin D deficiency, unspecified: Secondary | ICD-10-CM | POA: Diagnosis not present

## 2022-10-08 DIAGNOSIS — M810 Age-related osteoporosis without current pathological fracture: Secondary | ICD-10-CM | POA: Diagnosis not present

## 2022-10-08 DIAGNOSIS — N183 Chronic kidney disease, stage 3 unspecified: Secondary | ICD-10-CM | POA: Diagnosis not present

## 2022-10-08 DIAGNOSIS — R946 Abnormal results of thyroid function studies: Secondary | ICD-10-CM | POA: Diagnosis not present

## 2022-10-08 DIAGNOSIS — M109 Gout, unspecified: Secondary | ICD-10-CM | POA: Diagnosis not present

## 2022-10-08 DIAGNOSIS — D509 Iron deficiency anemia, unspecified: Secondary | ICD-10-CM | POA: Diagnosis not present

## 2022-10-08 DIAGNOSIS — Z1331 Encounter for screening for depression: Secondary | ICD-10-CM | POA: Diagnosis not present

## 2022-10-08 DIAGNOSIS — Z0000 Encounter for general adult medical examination without abnormal findings: Secondary | ICD-10-CM | POA: Diagnosis not present

## 2022-10-09 DIAGNOSIS — Z23 Encounter for immunization: Secondary | ICD-10-CM | POA: Diagnosis not present

## 2022-10-26 DIAGNOSIS — H903 Sensorineural hearing loss, bilateral: Secondary | ICD-10-CM | POA: Diagnosis not present

## 2022-11-15 ENCOUNTER — Encounter: Payer: Self-pay | Admitting: *Deleted

## 2022-11-15 ENCOUNTER — Other Ambulatory Visit: Payer: Self-pay

## 2022-11-15 ENCOUNTER — Encounter: Payer: Self-pay | Admitting: Hematology

## 2022-11-15 ENCOUNTER — Inpatient Hospital Stay: Payer: Medicare Other | Attending: Hematology | Admitting: Hematology

## 2022-11-15 ENCOUNTER — Inpatient Hospital Stay: Payer: Medicare Other

## 2022-11-15 VITALS — BP 176/59 | HR 49 | Ht 62.0 in | Wt 113.3 lb

## 2022-11-15 DIAGNOSIS — Z853 Personal history of malignant neoplasm of breast: Secondary | ICD-10-CM | POA: Insufficient documentation

## 2022-11-15 DIAGNOSIS — C50412 Malignant neoplasm of upper-outer quadrant of left female breast: Secondary | ICD-10-CM

## 2022-11-15 DIAGNOSIS — Z08 Encounter for follow-up examination after completed treatment for malignant neoplasm: Secondary | ICD-10-CM | POA: Insufficient documentation

## 2022-11-15 DIAGNOSIS — Z171 Estrogen receptor negative status [ER-]: Secondary | ICD-10-CM

## 2022-11-15 LAB — CBC WITH DIFFERENTIAL/PLATELET
Abs Immature Granulocytes: 0.01 10*3/uL (ref 0.00–0.07)
Basophils Absolute: 0.1 10*3/uL (ref 0.0–0.1)
Basophils Relative: 1 %
Eosinophils Absolute: 0.1 10*3/uL (ref 0.0–0.5)
Eosinophils Relative: 2 %
HCT: 34.1 % — ABNORMAL LOW (ref 36.0–46.0)
Hemoglobin: 11.2 g/dL — ABNORMAL LOW (ref 12.0–15.0)
Immature Granulocytes: 0 %
Lymphocytes Relative: 21 %
Lymphs Abs: 1.4 10*3/uL (ref 0.7–4.0)
MCH: 33.4 pg (ref 26.0–34.0)
MCHC: 32.8 g/dL (ref 30.0–36.0)
MCV: 101.8 fL — ABNORMAL HIGH (ref 80.0–100.0)
Monocytes Absolute: 0.6 10*3/uL (ref 0.1–1.0)
Monocytes Relative: 9 %
Neutro Abs: 4.5 10*3/uL (ref 1.7–7.7)
Neutrophils Relative %: 67 %
Platelets: 228 10*3/uL (ref 150–400)
RBC: 3.35 MIL/uL — ABNORMAL LOW (ref 3.87–5.11)
RDW: 13.6 % (ref 11.5–15.5)
WBC: 6.7 10*3/uL (ref 4.0–10.5)
nRBC: 0 % (ref 0.0–0.2)

## 2022-11-15 LAB — COMPREHENSIVE METABOLIC PANEL
ALT: 19 U/L (ref 0–44)
AST: 19 U/L (ref 15–41)
Albumin: 4.4 g/dL (ref 3.5–5.0)
Alkaline Phosphatase: 126 U/L (ref 38–126)
Anion gap: 8 (ref 5–15)
BUN: 37 mg/dL — ABNORMAL HIGH (ref 8–23)
CO2: 28 mmol/L (ref 22–32)
Calcium: 10.1 mg/dL (ref 8.9–10.3)
Chloride: 105 mmol/L (ref 98–111)
Creatinine, Ser: 1.56 mg/dL — ABNORMAL HIGH (ref 0.44–1.00)
GFR, Estimated: 31 mL/min — ABNORMAL LOW (ref >60)
Glucose, Bld: 98 mg/dL (ref 70–99)
Potassium: 4.3 mmol/L (ref 3.5–5.1)
Sodium: 141 mmol/L (ref 135–145)
Total Bilirubin: 0.4 mg/dL (ref 0.3–1.2)
Total Protein: 7.5 g/dL (ref 6.5–8.1)

## 2022-11-15 NOTE — Progress Notes (Signed)
[facility]   [phone] [phone]   Clinic Follow up Note   Patient Care Team: [name], MD as PCP - General (Internal Medicine) Mauro Kaufmann, RN as Oncology Nurse Navigator Rockwell Germany, RN as Oncology Nurse Navigator Erroll Luna, MD as Consulting Physician (General Surgery) Truitt Merle, MD as Consulting Physician (Hematology) Gery Pray, MD as Consulting Physician (Radiation Oncology)  Date of Service:  11/15/2022  CHIEF COMPLAINT: f/u of left breast cancer  CURRENT THERAPY:  Surveillance  ASSESSMENT & PLAN:  April Burns is a 88 y.o. post-hysterectomy female with   1. Malignant neoplasm of upper-outer quadrant of left breast, Apocrine carcinoma, Stage IB, c(T1c, N0), triple negative -diagnosed 04/2022 with palpable left breast lump. S/p left lumpectomy 05/23/22 showing 1.9 cm invasive apocrine adenocarcinoma with DCIS, margins negative. -given her age, we agreed to forego radiation and proceed with surveillance. -she is clinically doing well. We will obtain labs today. Physical exam was unremarkable. There is no clinical concern for recurrence. -f/u in 6 months. Annual mammogram due in 05/2023.     PLAN:  -lab and f/u with APP in 6 months -next MM in 05/2023   No problem-specific Assessment & Plan notes found for this encounter.   SUMMARY OF ONCOLOGIC HISTORY: Oncology History Overview Note   Cancer Staging  Malignant neoplasm of upper-outer quadrant of left breast in female, estrogen receptor negative ([place]) Staging form: Breast, AJCC 8th Edition - Clinical stage from 05/09/2022: cT1c, cN0, cM0, GX, ER-, PR-, HER2- - Signed by Truitt Merle, MD on 05/15/2022    Malignant neoplasm of upper-outer quadrant of left breast in female, estrogen receptor negative ([place])  05/01/2022 Imaging   Exam: Breast Ultrasound - Limited Unilateral - L  CLINICAL: Patient noticed a lump 2 weeks ago in the - Left Breast Upper Outer  Quadrant  IMPRESSION: The 1.3 x 1 x 1.3 cm lobulated mass in the left breast is highly suggestive of malignancy.   05/09/2022 Cancer Staging   Staging form: Breast, AJCC 8th Edition - Clinical stage from 05/09/2022: Stage IB (cT1c, cN0, cM0, G2, ER-, PR-, HER2-) - Signed by Truitt Merle, MD on 05/16/2022 Stage prefix: Initial diagnosis Nuclear grade: G2 Histologic grading system: 3 grade system   05/09/2022 Initial Biopsy   Diagnosis Breast, left, needle core biopsy, 1:00 4 cmfn - INVASIVE APOCRINE CARCINOMA. - SEE NOTE. Diagnosis Note The greatest tumor dimension is 1.1 cm.  Addendum: Immunohistochemistry for androgen receptor is strongly positive which supports the diagnosis.  PROGNOSTIC INDICATORS Results: The tumor cells are EQUIVOCAL for Her2 (2+). Her2 by FISH will be performed and the results reported separately. Estrogen Receptor: 0%, NEGATIVE Progesterone Receptor: 0%, NEGATIVE Proliferation Marker Ki67: 10%  FLUORESCENCE IN-SITU HYBRIDIZATION Results: GROUP 5: HER2 **NEGATIVE**   05/14/2022 Initial Diagnosis   Malignant neoplasm of upper-outer quadrant of left breast in female, estrogen receptor negative ([place])   05/30/2022 Genetic Testing   Negative hereditary cancer genetic testing: no pathogenic variants detected in [facility] CancerNext-Expanded +RNAinsight Panel.  Report date is May 30, 2022.   The CancerNext-Expanded gene panel offered by [facility] and includes sequencing, rearrangement, and RNA analysis for the following 77 genes: AIP, ALK, APC, ATM, AXIN2, BAP1, BARD1, BLM, BMPR1A, BRCA1, BRCA2, BRIP1, CDC73, CDH1, CDK4, CDKN1B, CDKN2A, CHEK2, CTNNA1, DICER1, FANCC, FH, FLCN, GALNT12, KIF1B, LZTR1, MAX, MEN1, MET, MLH1, MSH2, MSH3, MSH6, MUTYH, NBN, NF1, NF2, NTHL1, PALB2, PHOX2B, PMS2, POT1, PRKAR1A, PTCH1, PTEN, RAD51C, RAD51D, RB1, RECQL, RET, SDHA, SDHAF2, SDHB, SDHC,  SDHD, SMAD4, SMARCA4, SMARCB1, SMARCE1, STK11, SUFU, TMEM127, TP53, TSC1, TSC2, VHL and XRCC2  (sequencing and deletion/duplication); EGFR, EGLN1, HOXB13, KIT, MITF, PDGFRA, POLD1, and POLE (sequencing only); EPCAM and GREM1 (deletion/duplication only).    10/04/2022 Cancer Staging   Staging form: Breast, AJCC 8th Edition - Pathologic stage from 10/04/2022: Stage Unknown (pT1c, pNX, cM0, G2, ER-, PR-, HER2-) - Signed by Truitt Merle, MD on 11/15/2022 Stage prefix: Initial diagnosis Histologic grading system: 3 grade system Residual tumor (R): R0 - None      INTERVAL HISTORY:  April Burns is here for a follow up of breast cancer. She was last seen by me on 05/16/22 in breast clinic. She presents to the clinic with her grandson. She is doing well overall, no pain or other complains. She remains independent and active.    All other systems were reviewed with the patient and are negative.  MEDICAL HISTORY:  Past Medical History:  Diagnosis Date   Allergic rhinitis    Anemia    Arthritis    gout   Cancer ([place]) 04/2022   left breast invasive apocrine carcinoma   Chronic kidney disease    Diverticulosis    no current flare up   GERD (gastroesophageal reflux disease)    Gout    Hearing loss    HTN (hypertension)    Hypercholesterolemia    Lung nodule    Status post bronchoscopy reported to be noncancerous   Osteopenia    Restless leg syndrome     SURGICAL HISTORY: Past Surgical History:  Procedure Laterality Date   ABDOMINAL HYSTERECTOMY     BREAST LUMPECTOMY WITH RADIOACTIVE SEED LOCALIZATION Left 05/23/2022   Procedure: LEFT BREAST LUMPECTOMY WITH RADIOACTIVE SEED LOCALIZATION;  Surgeon: Erroll Luna, MD;  Location: [place];  Service: General;  Laterality: Left;   C section X2     cataract extractions bilateral  2010/2011   EYE SURGERY     bilateral   rt leg vein strippin for varicosities     [place]     TAH ovaries intact     VIDEO BRONCHOSCOPY  02/01/2012   Procedure: VIDEO BRONCHOSCOPY;  Surgeon: Grace Isaac, MD;  Location[facility]  OR;  Service: Thoracic;  Laterality: N/A;    I have reviewed the social history and family history with the patient and they are unchanged from previous note.  ALLERGIES:  is allergic to levaquin [levofloxacin], neosporin [neomycin-polymyxin-gramicidin], and statins.  MEDICATIONS:  Current Outpatient Medications  Medication Sig Dispense Refill   allopurinol (ZYLOPRIM) 100 MG tablet Take by mouth.     amLODipine-olmesartan (AZOR) 5-20 MG tablet 1 tablet     calcium-vitamin D (OSCAL WITH D) 500-200 MG-UNIT per tablet Take 1 tablet by mouth 2 (two) times daily.     cloNIDine (CATAPRES) 0.1 MG tablet 1 tablet     colchicine 0.6 MG tablet Take 0.6 mg by mouth daily.     COVID-19 mRNA Vac-TriS, Pfizer, SUSP injection Inject into the muscle. 0.3 mL 0   furosemide (LASIX) 20 MG tablet Take 20 mg by mouth daily as needed.     hydrochlorothiazide (HYDRODIURIL) 12.5 MG tablet 1 tablet in the morning     ibandronate (BONIVA) 150 MG tablet Take by mouth.     influenza vaccine adjuvanted (FLUAD) 0.5 ML injection Inject into the muscle. 0.5 mL 0   metoprolol succinate (TOPROL-XL) 25 MG 24 hr tablet Take 25 mg by mouth daily.     oxyCODONE (OXY IR/ROXICODONE) 5 MG immediate  release tablet Take 1 tablet (5 mg total) by mouth every 6 (six) hours as needed for severe pain. 15 tablet 0   pantoprazole (PROTONIX) 40 MG tablet Take 1 tablet (40 mg total) by mouth daily at 12 noon. 30 tablet 0   RSV vaccine recomb adjuvanted (AREXVY) 120 MCG/0.5ML injection Inject into the muscle. 0.5 mL 0   zolpidem (AMBIEN) 5 MG tablet Take 2.5 mg by mouth at bedtime.     No current facility-administered medications for this visit.    PHYSICAL EXAMINATION: ECOG PERFORMANCE STATUS: 0 - Asymptomatic  Vitals:   11/15/22 1308  BP: (!) 176/59  Pulse: (!) 49  SpO2: 100%   Wt Readings from Last 3 Encounters:  11/15/22 113 lb 4.8 oz (51.4 kg)  05/23/22 111 lb 15.9 oz (50.8 kg)  05/16/22 113 lb 12.8 oz (51.6 kg)      GENERAL:alert, no distress and comfortable SKIN: skin color, texture, turgor are normal, no rashes or significant lesions EYES: normal, Conjunctiva are pink and non-injected, sclera clear  NECK: supple, thyroid normal size, non-tender, without nodularity LYMPH:  no palpable lymphadenopathy in the cervical, axillary LUNGS: clear to auscultation and percussion with normal breathing effort HEART: regular rate & rhythm and no murmurs and no lower extremity edema ABDOMEN:abdomen soft, non-tender and normal bowel sounds Musculoskeletal:no cyanosis of digits and no clubbing  NEURO: alert & oriented x 3 with fluent speech, no focal motor/sensory deficits BREAST: (+) mild scar tissue at previous incision in left breast. No palpable mass, nodules or adenopathy bilaterally. Breast exam benign.   LABORATORY DATA:  I have reviewed the data as listed    Latest Ref Rng & Units 11/15/2022    1:45 PM 05/16/2022    8:19 AM 11/19/2019    2:08 AM  CBC  WBC 4.0 - 10.5 K/uL 6.7  4.9  5.5   Hemoglobin 12.0 - 15.0 g/dL 11.2  11.0  8.5   Hematocrit 36.0 - 46.0 % 34.1  33.6  26.1   Platelets 150 - 400 K/uL 228  236  324         Latest Ref Rng & Units 11/15/2022    1:45 PM 05/16/2022    8:19 AM 11/18/2019    2:04 AM  CMP  Glucose 70 - 99 mg/dL 98  90  103   BUN 8 - 23 mg/dL 37  41  9   Creatinine 0.44 - 1.00 mg/dL 1.56  1.85  1.14   Sodium 135 - 145 mmol/L 141  140  140   Potassium 3.5 - 5.1 mmol/L 4.3  4.3  3.9   Chloride 98 - 111 mmol/L 105  104  108   CO2 22 - 32 mmol/L _0 Calcium 8.9 - 10.3 mg/dL 10.1  9.4  8.9   Total Protein 6.5 - 8.1 g/dL 7.5  7.0    Total Bilirubin 0.3 - 1.2 mg/dL 0.4  0.7    Alkaline Phos 38 - 126 U/L 126  101    AST 15 - 41 U/L 19  18    ALT 0 - 44 U/L 19  11        RADIOGRAPHIC STUDIES: I have personally reviewed the radiological images as listed and agreed with the findings in the report. No results found.    Orders Placed This Encounter   Procedures   MM DIAG BREAST TOMO BILATERAL    Standing Status:   Future    Standing Expiration Date:  11/16/2023    Scheduling Instructions:     Teola Bradley    Order Specific Question:   Reason for Exam (SYMPTOM  OR DIAGNOSIS REQUIRED)    Answer:   screening    Order Specific Question:   Preferred imaging location?    Answer:   External   CBC with Differential/Platelet    Standing Status:   Standing    Number of Occurrences:   50    Standing Expiration Date:   11/16/2023   Comprehensive metabolic panel    Standing Status:   Standing    Number of Occurrences:   50    Standing Expiration Date:   11/16/2023   Cancer antigen 27.29    Standing Status:   Standing    Number of Occurrences:   20    Standing Expiration Date:   11/16/2023   All questions were answered. The patient knows to call the clinic with any problems, questions or concerns. No barriers to learning was detected. The total time spent in the appointment was 30 minutes.     Truitt Merle, MD 11/15/2022   I, Wilburn Mylar, am acting as scribe for Truitt Merle, MD.   I have reviewed the above documentation for accuracy and completeness, and I agree with the above.

## 2022-11-16 DIAGNOSIS — H353131 Nonexudative age-related macular degeneration, bilateral, early dry stage: Secondary | ICD-10-CM | POA: Diagnosis not present

## 2022-11-16 DIAGNOSIS — H524 Presbyopia: Secondary | ICD-10-CM | POA: Diagnosis not present

## 2022-11-16 LAB — CANCER ANTIGEN 27.29: CA 27.29: 16 U/mL (ref 0.0–38.6)

## 2022-11-19 ENCOUNTER — Telehealth: Payer: Self-pay

## 2022-11-19 NOTE — Telephone Encounter (Addendum)
Per Dr. Burr Medico made patients daughter [name] aware of message below Patient called back and was able to relay message below   ----- Message from Truitt Merle, MD sent at 11/16/2022  7:18 AM EST ----- Please let pt know her lab results, CKD stable, mild anemia, tumor marker normal, no concerns, thanks  Truitt Merle

## 2022-12-07 DIAGNOSIS — M76829 Posterior tibial tendinitis, unspecified leg: Secondary | ICD-10-CM | POA: Diagnosis not present

## 2022-12-07 DIAGNOSIS — M19071 Primary osteoarthritis, right ankle and foot: Secondary | ICD-10-CM | POA: Diagnosis not present

## 2022-12-07 DIAGNOSIS — M76821 Posterior tibial tendinitis, right leg: Secondary | ICD-10-CM | POA: Diagnosis not present

## 2022-12-07 DIAGNOSIS — R2689 Other abnormalities of gait and mobility: Secondary | ICD-10-CM | POA: Diagnosis not present

## 2023-01-10 DIAGNOSIS — L821 Other seborrheic keratosis: Secondary | ICD-10-CM | POA: Diagnosis not present

## 2023-01-10 DIAGNOSIS — Z85820 Personal history of malignant melanoma of skin: Secondary | ICD-10-CM | POA: Diagnosis not present

## 2023-01-10 DIAGNOSIS — L570 Actinic keratosis: Secondary | ICD-10-CM | POA: Diagnosis not present

## 2023-01-10 DIAGNOSIS — I8391 Asymptomatic varicose veins of right lower extremity: Secondary | ICD-10-CM | POA: Diagnosis not present

## 2023-01-10 DIAGNOSIS — Z85828 Personal history of other malignant neoplasm of skin: Secondary | ICD-10-CM | POA: Diagnosis not present

## 2023-01-23 DIAGNOSIS — M25571 Pain in right ankle and joints of right foot: Secondary | ICD-10-CM | POA: Diagnosis not present

## 2023-01-23 DIAGNOSIS — R2689 Other abnormalities of gait and mobility: Secondary | ICD-10-CM | POA: Diagnosis not present

## 2023-01-30 DIAGNOSIS — M25571 Pain in right ankle and joints of right foot: Secondary | ICD-10-CM | POA: Diagnosis not present

## 2023-01-30 DIAGNOSIS — R2689 Other abnormalities of gait and mobility: Secondary | ICD-10-CM | POA: Diagnosis not present

## 2023-02-08 DIAGNOSIS — R2689 Other abnormalities of gait and mobility: Secondary | ICD-10-CM | POA: Diagnosis not present

## 2023-02-08 DIAGNOSIS — M25571 Pain in right ankle and joints of right foot: Secondary | ICD-10-CM | POA: Diagnosis not present

## 2023-02-12 ENCOUNTER — Telehealth: Payer: Self-pay

## 2023-02-12 NOTE — Telephone Encounter (Signed)
April Burns, call today and stated that she was having some left breast pain. Pt stated that she had a lumpectomy on the left breast. 04/2022. Pt stated that she started having pain about 3 wks ago. Pt stated the pain only comes when she lift her left arm up and when she puts it down. Pt stated the past couple of days the pain has intensify, she rates her pain level at a 4. Pt stated that when she felt something under her left arm pit and above the left breast. Pt stated that she had not had a Mammogram since May of last year. Pt stated that her last office visit with Dr.Feng was in November 2023. Pt also stated that she does self exam on herself and she know that it does not feel right. I did let the pt know that she cn take some tylenol for pain.

## 2023-02-13 ENCOUNTER — Telehealth: Payer: Self-pay | Admitting: Hematology

## 2023-02-13 NOTE — Telephone Encounter (Signed)
Contacted patient to scheduled appointments. Left message with appointment details and a call back number if patient had any questions or could not accommodate the time we provided.   

## 2023-02-21 ENCOUNTER — Other Ambulatory Visit: Payer: Self-pay

## 2023-02-21 ENCOUNTER — Inpatient Hospital Stay: Payer: Medicare Other | Attending: Nurse Practitioner | Admitting: Nurse Practitioner

## 2023-02-21 ENCOUNTER — Encounter: Payer: Self-pay | Admitting: Nurse Practitioner

## 2023-02-21 ENCOUNTER — Telehealth: Payer: Self-pay

## 2023-02-21 ENCOUNTER — Other Ambulatory Visit: Payer: Medicare Other

## 2023-02-21 VITALS — BP 134/56 | HR 52 | Temp 98.0°F | Resp 15 | Wt 114.2 lb

## 2023-02-21 DIAGNOSIS — Z85828 Personal history of other malignant neoplasm of skin: Secondary | ICD-10-CM | POA: Insufficient documentation

## 2023-02-21 DIAGNOSIS — Z853 Personal history of malignant neoplasm of breast: Secondary | ICD-10-CM | POA: Diagnosis not present

## 2023-02-21 DIAGNOSIS — Z171 Estrogen receptor negative status [ER-]: Secondary | ICD-10-CM

## 2023-02-21 DIAGNOSIS — C50412 Malignant neoplasm of upper-outer quadrant of left female breast: Secondary | ICD-10-CM

## 2023-02-21 DIAGNOSIS — N644 Mastodynia: Secondary | ICD-10-CM | POA: Insufficient documentation

## 2023-02-21 NOTE — Telephone Encounter (Signed)
[place] @ [phone] to schedule patient Mammogram. It is schedule for May 10 @ 1pm.

## 2023-02-21 NOTE — Progress Notes (Addendum)
Patient Care Team: [name], MD as PCP - General (Internal Medicine) Mauro Kaufmann, RN as Oncology Nurse Navigator Rockwell Germany, RN as Oncology Nurse Navigator Erroll Luna, MD as Consulting Physician (General Surgery) Truitt Merle, MD as Consulting Physician (Hematology) Gery Pray, MD as Consulting Physician (Radiation Oncology)   CHIEF COMPLAINT: Follow up left breast cancer   Oncology History Overview Note   Cancer Staging  Malignant neoplasm of upper-outer quadrant of left breast in female, estrogen receptor negative [facility]) Staging form: Breast, AJCC 8th Edition - Clinical stage from 05/09/2022: cT1c, cN0, cM0, GX, ER-, PR-, HER2- - Signed by Truitt Merle, MD on 05/15/2022    Malignant neoplasm of upper-outer quadrant of left breast in female, estrogen receptor negative ([place])  05/01/2022 Imaging   Exam: Breast Ultrasound - Limited Unilateral - L  CLINICAL: Patient noticed a lump 2 weeks ago in the - Left Breast Upper Outer Quadrant  IMPRESSION: The 1.3 x 1 x 1.3 cm lobulated mass in the left breast is highly suggestive of malignancy.   05/09/2022 Cancer Staging   Staging form: Breast, AJCC 8th Edition - Clinical stage from 05/09/2022: Stage IB (cT1c, cN0, cM0, G2, ER-, PR-, HER2-) - Signed by Truitt Merle, MD on 05/16/2022 Stage prefix: Initial diagnosis Nuclear grade: G2 Histologic grading system: 3 grade system   05/09/2022 Initial Biopsy   Diagnosis Breast, left, needle core biopsy, 1:00 4 cmfn - INVASIVE APOCRINE CARCINOMA. - SEE NOTE. Diagnosis Note The greatest tumor dimension is 1.1 cm.  Addendum: Immunohistochemistry for androgen receptor is strongly positive which supports the diagnosis.  PROGNOSTIC INDICATORS Results: The tumor cells are EQUIVOCAL for Her2 (2+). Her2 by FISH will be performed and the results reported separately. Estrogen Receptor: 0%, NEGATIVE Progesterone Receptor: 0%, NEGATIVE Proliferation Marker Ki67: 10%  FLUORESCENCE IN-SITU  HYBRIDIZATION Results: GROUP 5: HER2 **NEGATIVE**   05/14/2022 Initial Diagnosis   Malignant neoplasm of upper-outer quadrant of left breast in female, estrogen receptor negative ([place])   05/30/2022 Genetic Testing   Negative hereditary cancer genetic testing: no pathogenic variants detected in [facility] CancerNext-Expanded +RNAinsight Panel.  Report date is May 30, 2022.   The CancerNext-Expanded gene panel offered by [facility] and includes sequencing, rearrangement, and RNA analysis for the following 77 genes: AIP, ALK, APC, ATM, AXIN2, BAP1, BARD1, BLM, BMPR1A, BRCA1, BRCA2, BRIP1, CDC73, CDH1, CDK4, CDKN1B, CDKN2A, CHEK2, CTNNA1, DICER1, FANCC, FH, FLCN, GALNT12, KIF1B, LZTR1, MAX, MEN1, MET, MLH1, MSH2, MSH3, MSH6, MUTYH, NBN, NF1, NF2, NTHL1, PALB2, PHOX2B, PMS2, POT1, PRKAR1A, PTCH1, PTEN, RAD51C, RAD51D, RB1, RECQL, RET, SDHA, SDHAF2, SDHB, SDHC, SDHD, SMAD4, SMARCA4, SMARCB1, SMARCE1, STK11, SUFU, TMEM127, TP53, TSC1, TSC2, VHL and XRCC2 (sequencing and deletion/duplication); EGFR, EGLN1, HOXB13, KIT, MITF, PDGFRA, POLD1, and POLE (sequencing only); EPCAM and GREM1 (deletion/duplication only).    10/04/2022 Cancer Staging   Staging form: Breast, AJCC 8th Edition - Pathologic stage from 10/04/2022: Stage Unknown (pT1c, pNX, cM0, G2, ER-, PR-, HER2-) - Signed by Truitt Merle, MD on 11/15/2022 Stage prefix: Initial diagnosis Histologic grading system: 3 grade system Residual tumor (R): R0 - None      CURRENT THERAPY: Surveillance   INTERVAL HISTORY Ms. Kary presents for symptom management. Last seen by Dr. Burr Medico for routine surveillance 11/15/22. She called to report new left breast pain lasting 3 days and is being brought in to evaluate. Today the pain is gone. Denies new lump/mass, nipple discharge, or skin change. Left nipple has been inverted since surgery. Denies fall/trauma. She wonders if she strained  a muscle carrying groceries.   ROS  All other systems reviewed and negative    Past Medical History:  Diagnosis Date   Allergic rhinitis    Anemia    Arthritis    gout   Cancer ([place]) 04/2022   left breast invasive apocrine carcinoma   Chronic kidney disease    Diverticulosis    no current flare up   GERD (gastroesophageal reflux disease)    Gout    Hearing loss    HTN (hypertension)    Hypercholesterolemia    Lung nodule    Status post bronchoscopy reported to be noncancerous   Osteopenia    Restless leg syndrome      Past Surgical History:  Procedure Laterality Date   ABDOMINAL HYSTERECTOMY     BREAST LUMPECTOMY WITH RADIOACTIVE SEED LOCALIZATION Left 05/23/2022   Procedure: LEFT BREAST LUMPECTOMY WITH RADIOACTIVE SEED LOCALIZATION;  Surgeon: Erroll Luna, MD;  Location: [facility];  Service: General;  Laterality: Left;   C section X2     cataract extractions bilateral  2010/2011   EYE SURGERY     bilateral   rt leg vein strippin for varicosities     stoneburner     TAH ovaries intact     VIDEO BRONCHOSCOPY  02/01/2012   Procedure: VIDEO BRONCHOSCOPY;  Surgeon: Grace Isaac, MD;  [facility];  Service: Thoracic;  Laterality: N/A;     Outpatient Encounter Medications as of 02/21/2023  Medication Sig   allopurinol (ZYLOPRIM) 100 MG tablet Take by mouth.   pantoprazole (PROTONIX) 40 MG tablet Take 1 tablet (40 mg total) by mouth daily at 12 noon.   zolpidem (AMBIEN) 5 MG tablet Take 2.5 mg by mouth at bedtime.   amLODipine-olmesartan (AZOR) 5-20 MG tablet 1 tablet   calcium-vitamin D (OSCAL WITH D) 500-200 MG-UNIT per tablet Take 1 tablet by mouth 2 (two) times daily.   cloNIDine (CATAPRES) 0.1 MG tablet 1 tablet   colchicine 0.6 MG tablet Take 0.6 mg by mouth daily.   COVID-19 mRNA Vac-TriS, Pfizer, SUSP injection Inject into the muscle.   furosemide (LASIX) 20 MG tablet Take 20 mg by mouth daily as needed.   hydrochlorothiazide (HYDRODIURIL) 12.5 MG tablet 1 tablet in the morning   ibandronate (BONIVA) 150 MG tablet  Take by mouth.   influenza vaccine adjuvanted (FLUAD) 0.5 ML injection Inject into the muscle. (Patient not taking: Reported on 02/21/2023)   metoprolol succinate (TOPROL-XL) 25 MG 24 hr tablet Take 25 mg by mouth daily.   oxyCODONE (OXY IR/ROXICODONE) 5 MG immediate release tablet Take 1 tablet (5 mg total) by mouth every 6 (six) hours as needed for severe pain.   RSV vaccine recomb adjuvanted (AREXVY) 120 MCG/0.5ML injection Inject into the muscle.   No facility-administered encounter medications on file as of 02/21/2023.     Today's Vitals   02/21/23 1023 02/21/23 1030  BP: (!) 134/56   Pulse: (!) 52   Resp: 15   Temp: 98 F (36.7 C)   TempSrc: Oral   SpO2: 99%   Weight: 114 lb 3.2 oz (51.8 kg)   PainSc:  0-No pain   Body mass index is 20.89 kg/m.   PHYSICAL EXAM GENERAL:alert, no distress and comfortable SKIN: no rash  EYES: sclera clear NECK: without mass LYMPH:  no palpable cervical or supraclavicular lymphadenopathy  LUNGS: clear with normal breathing effort HEART: regular rate & rhythm, no lower extremity edema ABDOMEN: abdomen soft, non-tender and normal bowel sounds NEURO:  alert & oriented x 3 with fluent speech, no focal motor/sensory deficits Breast exam: Symmetrical without nipple discharge.  S/p left lumpectomy, incisions completely healed.  Left nipple inverted out discharge, postop baseline per patient.  No palpable mass or nodularity in either breast or axilla that I could appreciate.   LAB DATA No labs for this visit    ASSESSMENT & PLAN:  89 y.o. post-hysterectomy female with   Left breast pain -Onset ~1 week ago, pain was persistent for approximately 3 days then resolved -Exam is benign -? Costochondral muscle strain vs scar tissue vs painful dense breast tissue. We discussed normal post-lumpectomy pain. My suspicious for recurrence is low  -Proceed with diagnostic mammogram in May as planned, will call [facility] -She prefers to keep surveillance visit  5/14 as scheduled, she would like to be seen again before she moves to the [place] from June - Oct  Malignant neoplasm of upper-outer quadrant of left breast, Apocrine carcinoma, Stage IB, c(T1c, N0), triple negative -diagnosed 04/2022 with palpable left breast lump. S/p left lumpectomy 05/23/22  -given her age, adjuvant radiation was omitted  -On surveillance    PLAN: -Proceed with diagnostic mammogram in May, I will ask my nurse to call [facility] to schedule for pt per her request  -Surveillance visit 05/14/23 as scheduled   Orders Placed This Encounter  Procedures   MM DIAG BREAST TOMO BILATERAL    Standing Status:   Future    Standing Expiration Date:   02/22/2024    Scheduling Instructions:     [facility]    Order Specific Question:   Reason for Exam (SYMPTOM  OR DIAGNOSIS REQUIRED)    Answer:   h/o left breast cancer 04/2022 s/p lumpectomy. left breast pain    Order Specific Question:   Preferred imaging location?    Answer:   External    All questions were answered. The patient knows to call the clinic with any problems, questions or concerns. No barriers to learning were detected.   Cira Rue, NP-C 02/21/23

## 2023-02-21 NOTE — Addendum Note (Signed)
Addended by: Alla Feeling on: 02/21/2023 12:00 PM   Modules accepted: Orders

## 2023-02-27 DIAGNOSIS — R2689 Other abnormalities of gait and mobility: Secondary | ICD-10-CM | POA: Diagnosis not present

## 2023-02-27 DIAGNOSIS — M25571 Pain in right ankle and joints of right foot: Secondary | ICD-10-CM | POA: Diagnosis not present

## 2023-04-09 DIAGNOSIS — I10 Essential (primary) hypertension: Secondary | ICD-10-CM | POA: Diagnosis not present

## 2023-04-09 DIAGNOSIS — M810 Age-related osteoporosis without current pathological fracture: Secondary | ICD-10-CM | POA: Diagnosis not present

## 2023-04-09 DIAGNOSIS — I700 Atherosclerosis of aorta: Secondary | ICD-10-CM | POA: Diagnosis not present

## 2023-04-09 DIAGNOSIS — E538 Deficiency of other specified B group vitamins: Secondary | ICD-10-CM | POA: Diagnosis not present

## 2023-04-09 DIAGNOSIS — Z171 Estrogen receptor negative status [ER-]: Secondary | ICD-10-CM | POA: Diagnosis not present

## 2023-04-09 DIAGNOSIS — J309 Allergic rhinitis, unspecified: Secondary | ICD-10-CM | POA: Diagnosis not present

## 2023-04-09 DIAGNOSIS — E611 Iron deficiency: Secondary | ICD-10-CM | POA: Diagnosis not present

## 2023-04-09 DIAGNOSIS — N183 Chronic kidney disease, stage 3 unspecified: Secondary | ICD-10-CM | POA: Diagnosis not present

## 2023-04-09 DIAGNOSIS — J449 Chronic obstructive pulmonary disease, unspecified: Secondary | ICD-10-CM | POA: Diagnosis not present

## 2023-04-10 ENCOUNTER — Telehealth: Payer: Self-pay

## 2023-04-10 NOTE — Telephone Encounter (Signed)
Faxed signed orders to [facility] as per Dr. Mosetta Putt. Received fax confirmation.

## 2023-04-24 DIAGNOSIS — M5416 Radiculopathy, lumbar region: Secondary | ICD-10-CM | POA: Diagnosis not present

## 2023-05-10 DIAGNOSIS — C50412 Malignant neoplasm of upper-outer quadrant of left female breast: Secondary | ICD-10-CM | POA: Diagnosis not present

## 2023-05-10 DIAGNOSIS — Z171 Estrogen receptor negative status [ER-]: Secondary | ICD-10-CM | POA: Diagnosis not present

## 2023-05-13 NOTE — Progress Notes (Unsigned)
Patient Care Team: Marden Noble, MD as PCP - General (Internal Medicine) Pershing Proud, RN as Oncology Nurse Navigator Donnelly Angelica, RN as Oncology Nurse Navigator Harriette Bouillon, MD as Consulting Physician (General Surgery) Malachy Mood, MD as Consulting Physician (Hematology) Antony Blackbird, MD as Consulting Physician (Radiation Oncology)   CHIEF COMPLAINT: Follow up left breast cancer   Oncology History Overview Note   Cancer Staging  Malignant neoplasm of upper-outer quadrant of left breast in female, estrogen receptor negative [facility]) Staging form: Breast, AJCC 8th Edition - Clinical stage from 05/09/2022: cT1c, cN0, cM0, GX, ER-, PR-, HER2- - Signed by Malachy Mood, MD on 05/15/2022    Malignant neoplasm of upper-outer quadrant of left breast in female, estrogen receptor negative ([facility])  05/01/2022 Imaging   Exam: Breast Ultrasound - Limited Unilateral - L  CLINICAL: Patient noticed a lump 2 weeks ago in the - Left Breast Upper Outer Quadrant  IMPRESSION: The 1.3 x 1 x 1.3 cm lobulated mass in the left breast is highly suggestive of malignancy.   05/09/2022 Cancer Staging   Staging form: Breast, AJCC 8th Edition - Clinical stage from 05/09/2022: Stage IB (cT1c, cN0, cM0, G2, ER-, PR-, HER2-) - Signed by Malachy Mood, MD on 05/16/2022 Stage prefix: Initial diagnosis Nuclear grade: G2 Histologic grading system: 3 grade system   05/09/2022 Initial Biopsy   Diagnosis Breast, left, needle core biopsy, 1:00 4 cmfn - INVASIVE APOCRINE CARCINOMA. - SEE NOTE. Diagnosis Note The greatest tumor dimension is 1.1 cm.  Addendum: Immunohistochemistry for androgen receptor is strongly positive which supports the diagnosis.  PROGNOSTIC INDICATORS Results: The tumor cells are EQUIVOCAL for Her2 (2+). Her2 by FISH will be performed and the results reported separately. Estrogen Receptor: 0%, NEGATIVE Progesterone Receptor: 0%, NEGATIVE Proliferation Marker Ki67: 10%  FLUORESCENCE IN-SITU  HYBRIDIZATION Results: GROUP 5: HER2 **NEGATIVE**   05/14/2022 Initial Diagnosis   Malignant neoplasm of upper-outer quadrant of left breast in female, estrogen receptor negative ([facility])   05/30/2022 Genetic Testing   Negative hereditary cancer genetic testing: no pathogenic variants detected in [facility] CancerNext-Expanded +RNAinsight Panel.  Report date is May 30, 2022.   The CancerNext-Expanded gene panel offered by [facility] and includes sequencing, rearrangement, and RNA analysis for the following 77 genes: AIP, ALK, APC, ATM, AXIN2, BAP1, BARD1, BLM, BMPR1A, BRCA1, BRCA2, BRIP1, CDC73, CDH1, CDK4, CDKN1B, CDKN2A, CHEK2, CTNNA1, DICER1, FANCC, FH, FLCN, GALNT12, KIF1B, LZTR1, MAX, MEN1, MET, MLH1, MSH2, MSH3, MSH6, MUTYH, NBN, NF1, NF2, NTHL1, PALB2, PHOX2B, PMS2, POT1, PRKAR1A, PTCH1, PTEN, RAD51C, RAD51D, RB1, RECQL, RET, SDHA, SDHAF2, SDHB, SDHC, SDHD, SMAD4, SMARCA4, SMARCB1, SMARCE1, STK11, SUFU, TMEM127, TP53, TSC1, TSC2, VHL and XRCC2 (sequencing and deletion/duplication); EGFR, EGLN1, HOXB13, KIT, MITF, PDGFRA, POLD1, and POLE (sequencing only); EPCAM and GREM1 (deletion/duplication only).    10/04/2022 Cancer Staging   Staging form: Breast, AJCC 8th Edition - Pathologic stage from 10/04/2022: Stage Unknown (pT1c, pNX, cM0, G2, ER-, PR-, HER2-) - Signed by Malachy Mood, MD on 11/15/2022 Stage prefix: Initial diagnosis Histologic grading system: 3 grade system Residual tumor (R): R0 - None      CURRENT THERAPY: Surveillance   INTERVAL HISTORY Ms. Baal returns for follow up as scheduled. Last seen by me 02/21/23 for left breast pain.  This has resolved.  She had a recent mammogram at [facility] which was good.  She denies any concerns or changes in her health except new constipation which began about 4 weeks ago.  She is managing with MiraLAX, had a small  BM today.  Denies abdominal pain/bloating or new or worsening rectal bleeding.  ROS  All other systems reviewed and negative  Past  Medical History:  Diagnosis Date   Allergic rhinitis    Anemia    Arthritis    gout   Cancer ([facility]) 04/2022   left breast invasive apocrine carcinoma   Chronic kidney disease    Diverticulosis    no current flare up   GERD (gastroesophageal reflux disease)    Gout    Hearing loss    HTN (hypertension)    Hypercholesterolemia    Lung nodule    Status post bronchoscopy reported to be noncancerous   Osteopenia    Restless leg syndrome      Past Surgical History:  Procedure Laterality Date   ABDOMINAL HYSTERECTOMY     BREAST LUMPECTOMY WITH RADIOACTIVE SEED LOCALIZATION Left 05/23/2022   Procedure: LEFT BREAST LUMPECTOMY WITH RADIOACTIVE SEED LOCALIZATION;  Surgeon: Harriette Bouillon, MD;  Location: [facility];  Service: General;  Laterality: Left;   C section X2     cataract extractions bilateral  2010/2011   EYE SURGERY     bilateral   rt leg vein strippin for varicosities     stoneburner     TAH ovaries intact     VIDEO BRONCHOSCOPY  02/01/2012   Procedure: VIDEO BRONCHOSCOPY;  Surgeon: Delight Ovens, MD;  Location: [facility];  Service: Thoracic;  Laterality: N/A;     Outpatient Encounter Medications as of 05/14/2023  Medication Sig   allopurinol (ZYLOPRIM) 100 MG tablet Take by mouth.   amLODipine-olmesartan (AZOR) 5-20 MG tablet 1 tablet   calcium-vitamin D (OSCAL WITH D) 500-200 MG-UNIT per tablet Take 1 tablet by mouth 2 (two) times daily.   cloNIDine (CATAPRES) 0.1 MG tablet 1 tablet   COVID-19 mRNA Vac-TriS, Pfizer, SUSP injection Inject into the muscle.   furosemide (LASIX) 20 MG tablet Take 20 mg by mouth daily as needed.   hydrochlorothiazide (HYDRODIURIL) 12.5 MG tablet 1 tablet in the morning   influenza vaccine adjuvanted (FLUAD) 0.5 ML injection Inject into the muscle.   metoprolol succinate (TOPROL-XL) 25 MG 24 hr tablet Take 25 mg by mouth daily.   oxyCODONE (OXY IR/ROXICODONE) 5 MG immediate release tablet Take 1 tablet (5 mg total) by mouth  every 6 (six) hours as needed for severe pain.   pantoprazole (PROTONIX) 40 MG tablet Take 1 tablet (40 mg total) by mouth daily at 12 noon.   RSV vaccine recomb adjuvanted (AREXVY) 120 MCG/0.5ML injection Inject into the muscle.   zolpidem (AMBIEN) 5 MG tablet Take 2.5 mg by mouth at bedtime.   colchicine 0.6 MG tablet Take 0.6 mg by mouth daily. (Patient not taking: Reported on 05/14/2023)   ibandronate (BONIVA) 150 MG tablet Take by mouth. (Patient not taking: Reported on 05/14/2023)   No facility-administered encounter medications on file as of 05/14/2023.     Today's Vitals   05/14/23 1003  BP: (!) 131/49  Pulse: 64  Resp: 17  Temp: 97.8 F (36.6 C)  TempSrc: Temporal  SpO2: 100%  Weight: 111 lb 1.6 oz (50.4 kg)   Body mass index is 20.32 kg/m.   PHYSICAL EXAM GENERAL:alert, no distress and comfortable SKIN: no rash  EYES: sclera clear NECK: without mass LYMPH:  no palpable cervical or supraclavicular lymphadenopathy  LUNGS:  normal breathing effort HEART: no lower extremity edema ABDOMEN: abdomen soft, non-tender and normal bowel sounds NEURO: alert & oriented x 3 with fluent speech,  no focal motor/sensory deficits Breast exam: No nipple discharge or inversion.  S/p left lumpectomy, incisions completely healed.  No palpable mass or nodularity in either breast or axilla that I could appreciate.   CBC    Component Value Date/Time   WBC 5.2 05/14/2023 0919   RBC 3.04 (L) 05/14/2023 0919   HGB 10.1 (L) 05/14/2023 0919   HGB 11.0 (L) 05/16/2022 0819   HCT 30.9 (L) 05/14/2023 0919   HCT 31.3 (L) 11/15/2019 0852   PLT 220 05/14/2023 0919   PLT 236 05/16/2022 0819   MCV 101.6 (H) 05/14/2023 0919   MCH 33.2 05/14/2023 0919   MCHC 32.7 05/14/2023 0919   RDW 13.7 05/14/2023 0919   LYMPHSABS 0.9 05/14/2023 0919   MONOABS 0.5 05/14/2023 0919   EOSABS 0.2 05/14/2023 0919   BASOSABS 0.0 05/14/2023 0919     CMP     Component Value Date/Time   NA 141 05/14/2023 0919    K 4.1 05/14/2023 0919   CL 104 05/14/2023 0919   CO2 28 05/14/2023 0919   GLUCOSE 92 05/14/2023 0919   BUN 41 (H) 05/14/2023 0919   CREATININE 1.67 (H) 05/14/2023 0919   CREATININE 1.85 (H) 05/16/2022 0819   CREATININE 1.80 (H) 08/13/2012 0922   CALCIUM 9.4 05/14/2023 0919   PROT 6.7 05/14/2023 0919   ALBUMIN 4.0 05/14/2023 0919   AST 15 05/14/2023 0919   AST 18 05/16/2022 0819   ALT 10 05/14/2023 0919   ALT 11 05/16/2022 0819   ALKPHOS 110 05/14/2023 0919   BILITOT 0.5 05/14/2023 0919   BILITOT 0.7 05/16/2022 0819   GFRNONAA 28 (L) 05/14/2023 0919   GFRNONAA 25 (L) 05/16/2022 0819   GFRAA 49 (L) 11/18/2019 0204     ASSESSMENT & PLAN:89 y.o. post-hysterectomy female with      Malignant neoplasm of upper-outer quadrant of left breast, Apocrine carcinoma, Stage IB, c(T1c, N0), triple negative -diagnosed 04/2022 with palpable left breast lump. S/p left lumpectomy 05/23/22  -given her age, adjuvant radiation was omitted  -On surveillance  -seen 01/2023 for brief episode of breast pain, exam was benign and this resolved -Ms. Dansereau is clinically doing well.  Breast exam is benign, labs are stable.  Will request her recent mammogram which was reportedly negative.  Overall no clinical concern for recurrence. -Continue breast cancer surveillance -Follow-up with me in 6 months, or sooner if needed  2.  Change in bowel habits -She has new onset constipation approximately 4 weeks ago, no change in diet or medication -Weight mostly stable.  Denies abdominal pain/bloating or rectal bleeding  -Managing with MiraLAX.  We reviewed symptom management, I encouraged her to hydrate.  She could try holding calcium to see if that helps -If constipation resolves off calcium then resume vitamin D alone -Encouraged to follow-up with PCP if symptoms worsen or fail to improve   PLAN: -Labs reviewed, Scr elevated, encouraged hydration -Discussed symptom management for constipation; hold calcium,  monitor. If bowels normalize off calcium, resume vit D alone. F/up PCP if needed -Continue breast cancer surveillance  -Will request [facility] from [facility] -F/up with me in 6 months, or sooner if needed   All questions were answered. The patient knows to call the clinic with any problems, questions or concerns. No barriers to learning were detected. I spent 20 minutes counseling the patient face to face. The total time spent in the appointment was 30 minutes and more than 50% was on counseling, review of test results, and coordination of  care.   Santiago Glad, NP-C 05/14/2023

## 2023-05-14 ENCOUNTER — Inpatient Hospital Stay: Payer: Medicare Other | Attending: Nurse Practitioner

## 2023-05-14 ENCOUNTER — Encounter: Payer: Self-pay | Admitting: Nurse Practitioner

## 2023-05-14 ENCOUNTER — Inpatient Hospital Stay (HOSPITAL_BASED_OUTPATIENT_CLINIC_OR_DEPARTMENT_OTHER): Payer: Medicare Other | Admitting: Nurse Practitioner

## 2023-05-14 VITALS — BP 131/49 | HR 64 | Temp 97.8°F | Resp 17 | Wt 111.1 lb

## 2023-05-14 DIAGNOSIS — Z853 Personal history of malignant neoplasm of breast: Secondary | ICD-10-CM | POA: Insufficient documentation

## 2023-05-14 DIAGNOSIS — C50412 Malignant neoplasm of upper-outer quadrant of left female breast: Secondary | ICD-10-CM | POA: Diagnosis not present

## 2023-05-14 DIAGNOSIS — Z08 Encounter for follow-up examination after completed treatment for malignant neoplasm: Secondary | ICD-10-CM | POA: Insufficient documentation

## 2023-05-14 DIAGNOSIS — Z171 Estrogen receptor negative status [ER-]: Secondary | ICD-10-CM | POA: Diagnosis not present

## 2023-05-14 LAB — CBC WITH DIFFERENTIAL/PLATELET
Abs Immature Granulocytes: 0.02 10*3/uL (ref 0.00–0.07)
Basophils Absolute: 0 10*3/uL (ref 0.0–0.1)
Basophils Relative: 1 %
Eosinophils Absolute: 0.2 10*3/uL (ref 0.0–0.5)
Eosinophils Relative: 4 %
HCT: 30.9 % — ABNORMAL LOW (ref 36.0–46.0)
Hemoglobin: 10.1 g/dL — ABNORMAL LOW (ref 12.0–15.0)
Immature Granulocytes: 0 %
Lymphocytes Relative: 17 %
Lymphs Abs: 0.9 10*3/uL (ref 0.7–4.0)
MCH: 33.2 pg (ref 26.0–34.0)
MCHC: 32.7 g/dL (ref 30.0–36.0)
MCV: 101.6 fL — ABNORMAL HIGH (ref 80.0–100.0)
Monocytes Absolute: 0.5 10*3/uL (ref 0.1–1.0)
Monocytes Relative: 10 %
Neutro Abs: 3.6 10*3/uL (ref 1.7–7.7)
Neutrophils Relative %: 68 %
Platelets: 220 10*3/uL (ref 150–400)
RBC: 3.04 MIL/uL — ABNORMAL LOW (ref 3.87–5.11)
RDW: 13.7 % (ref 11.5–15.5)
WBC: 5.2 10*3/uL (ref 4.0–10.5)
nRBC: 0 % (ref 0.0–0.2)

## 2023-05-14 LAB — COMPREHENSIVE METABOLIC PANEL
ALT: 10 U/L (ref 0–44)
AST: 15 U/L (ref 15–41)
Albumin: 4 g/dL (ref 3.5–5.0)
Alkaline Phosphatase: 110 U/L (ref 38–126)
Anion gap: 9 (ref 5–15)
BUN: 41 mg/dL — ABNORMAL HIGH (ref 8–23)
CO2: 28 mmol/L (ref 22–32)
Calcium: 9.4 mg/dL (ref 8.9–10.3)
Chloride: 104 mmol/L (ref 98–111)
Creatinine, Ser: 1.67 mg/dL — ABNORMAL HIGH (ref 0.44–1.00)
GFR, Estimated: 28 mL/min — ABNORMAL LOW (ref >60)
Glucose, Bld: 92 mg/dL (ref 70–99)
Potassium: 4.1 mmol/L (ref 3.5–5.1)
Sodium: 141 mmol/L (ref 135–145)
Total Bilirubin: 0.5 mg/dL (ref 0.3–1.2)
Total Protein: 6.7 g/dL (ref 6.5–8.1)

## 2023-05-15 DIAGNOSIS — Z85820 Personal history of malignant melanoma of skin: Secondary | ICD-10-CM | POA: Diagnosis not present

## 2023-05-15 DIAGNOSIS — L570 Actinic keratosis: Secondary | ICD-10-CM | POA: Diagnosis not present

## 2023-05-15 DIAGNOSIS — D485 Neoplasm of uncertain behavior of skin: Secondary | ICD-10-CM | POA: Diagnosis not present

## 2023-05-15 DIAGNOSIS — Z85828 Personal history of other malignant neoplasm of skin: Secondary | ICD-10-CM | POA: Diagnosis not present

## 2023-05-15 DIAGNOSIS — I8391 Asymptomatic varicose veins of right lower extremity: Secondary | ICD-10-CM | POA: Diagnosis not present

## 2023-05-15 DIAGNOSIS — D2271 Melanocytic nevi of right lower limb, including hip: Secondary | ICD-10-CM | POA: Diagnosis not present

## 2023-05-15 DIAGNOSIS — D692 Other nonthrombocytopenic purpura: Secondary | ICD-10-CM | POA: Diagnosis not present

## 2023-05-15 DIAGNOSIS — I8392 Asymptomatic varicose veins of left lower extremity: Secondary | ICD-10-CM | POA: Diagnosis not present

## 2023-05-15 DIAGNOSIS — D2272 Melanocytic nevi of left lower limb, including hip: Secondary | ICD-10-CM | POA: Diagnosis not present

## 2023-05-15 DIAGNOSIS — L821 Other seborrheic keratosis: Secondary | ICD-10-CM | POA: Diagnosis not present

## 2023-05-15 LAB — CANCER ANTIGEN 27.29: CA 27.29: 23 U/mL (ref 0.0–38.6)

## 2023-05-22 ENCOUNTER — Telehealth: Payer: Self-pay

## 2023-05-22 NOTE — Telephone Encounter (Signed)
Spoke with pt via telephone regarding recent labs.  Informed pt that Santiago Glad, NP has reviewed the pt's recent labs and her breast tumor maker is WNL.  Pt was glad to hear such great news.  Informed pt that Lacie will continue to monitor her labs and if anything new arises regarding her breast to please reach out to our office.  Pt verbalized understanding and had no further questions or concerns.

## 2023-05-24 DIAGNOSIS — D485 Neoplasm of uncertain behavior of skin: Secondary | ICD-10-CM | POA: Diagnosis not present

## 2023-05-24 DIAGNOSIS — L989 Disorder of the skin and subcutaneous tissue, unspecified: Secondary | ICD-10-CM | POA: Diagnosis not present

## 2023-05-30 ENCOUNTER — Encounter: Payer: Self-pay | Admitting: Nurse Practitioner

## 2023-05-30 NOTE — Progress Notes (Signed)
I received and reviewed b/l diag mammo from [facility] on 05/10/23, which showed probably benign left breast calcifications. A f/up 6 month mammogram has been recommended. She has a visit with me in November.   Santiago Glad, NP

## 2023-06-03 ENCOUNTER — Encounter: Payer: Self-pay | Admitting: Hematology

## 2023-09-01 DIAGNOSIS — I452 Bifascicular block: Secondary | ICD-10-CM | POA: Diagnosis present

## 2023-09-01 DIAGNOSIS — R9431 Abnormal electrocardiogram [ECG] [EKG]: Secondary | ICD-10-CM | POA: Diagnosis not present

## 2023-09-01 DIAGNOSIS — J984 Other disorders of lung: Secondary | ICD-10-CM | POA: Diagnosis not present

## 2023-09-01 DIAGNOSIS — Z5329 Procedure and treatment not carried out because of patient's decision for other reasons: Secondary | ICD-10-CM | POA: Diagnosis not present

## 2023-09-01 DIAGNOSIS — I129 Hypertensive chronic kidney disease with stage 1 through stage 4 chronic kidney disease, or unspecified chronic kidney disease: Secondary | ICD-10-CM | POA: Diagnosis present

## 2023-09-01 DIAGNOSIS — R0602 Shortness of breath: Secondary | ICD-10-CM | POA: Diagnosis not present

## 2023-09-01 DIAGNOSIS — R0600 Dyspnea, unspecified: Secondary | ICD-10-CM | POA: Diagnosis not present

## 2023-09-01 DIAGNOSIS — Z66 Do not resuscitate: Secondary | ICD-10-CM | POA: Diagnosis present

## 2023-09-01 DIAGNOSIS — N184 Chronic kidney disease, stage 4 (severe): Secondary | ICD-10-CM | POA: Diagnosis present

## 2023-09-01 DIAGNOSIS — U071 COVID-19: Secondary | ICD-10-CM | POA: Diagnosis present

## 2023-09-01 DIAGNOSIS — Z85118 Personal history of other malignant neoplasm of bronchus and lung: Secondary | ICD-10-CM | POA: Diagnosis not present

## 2023-09-01 DIAGNOSIS — I700 Atherosclerosis of aorta: Secondary | ICD-10-CM | POA: Diagnosis present

## 2023-09-01 DIAGNOSIS — R7989 Other specified abnormal findings of blood chemistry: Secondary | ICD-10-CM | POA: Diagnosis not present

## 2023-09-01 DIAGNOSIS — Z902 Acquired absence of lung [part of]: Secondary | ICD-10-CM | POA: Diagnosis not present

## 2023-09-01 DIAGNOSIS — I082 Rheumatic disorders of both aortic and tricuspid valves: Secondary | ICD-10-CM | POA: Diagnosis not present

## 2023-09-01 DIAGNOSIS — R918 Other nonspecific abnormal finding of lung field: Secondary | ICD-10-CM | POA: Diagnosis not present

## 2023-09-01 DIAGNOSIS — G4700 Insomnia, unspecified: Secondary | ICD-10-CM | POA: Diagnosis present

## 2023-09-01 DIAGNOSIS — I77819 Aortic ectasia, unspecified site: Secondary | ICD-10-CM | POA: Diagnosis not present

## 2023-09-01 DIAGNOSIS — K219 Gastro-esophageal reflux disease without esophagitis: Secondary | ICD-10-CM | POA: Diagnosis present

## 2023-09-01 DIAGNOSIS — I214 Non-ST elevation (NSTEMI) myocardial infarction: Secondary | ICD-10-CM | POA: Diagnosis present

## 2023-09-01 DIAGNOSIS — I10 Essential (primary) hypertension: Secondary | ICD-10-CM | POA: Diagnosis not present

## 2023-09-01 DIAGNOSIS — R0689 Other abnormalities of breathing: Secondary | ICD-10-CM | POA: Diagnosis not present

## 2023-09-01 DIAGNOSIS — N289 Disorder of kidney and ureter, unspecified: Secondary | ICD-10-CM | POA: Diagnosis not present

## 2023-09-02 DIAGNOSIS — I10 Essential (primary) hypertension: Secondary | ICD-10-CM | POA: Diagnosis not present

## 2023-09-02 DIAGNOSIS — G4700 Insomnia, unspecified: Secondary | ICD-10-CM | POA: Diagnosis present

## 2023-09-02 DIAGNOSIS — R0689 Other abnormalities of breathing: Secondary | ICD-10-CM | POA: Diagnosis not present

## 2023-09-02 DIAGNOSIS — Z66 Do not resuscitate: Secondary | ICD-10-CM | POA: Diagnosis present

## 2023-09-02 DIAGNOSIS — K219 Gastro-esophageal reflux disease without esophagitis: Secondary | ICD-10-CM | POA: Diagnosis present

## 2023-09-02 DIAGNOSIS — J984 Other disorders of lung: Secondary | ICD-10-CM | POA: Diagnosis not present

## 2023-09-02 DIAGNOSIS — R918 Other nonspecific abnormal finding of lung field: Secondary | ICD-10-CM | POA: Diagnosis not present

## 2023-09-02 DIAGNOSIS — Z5329 Procedure and treatment not carried out because of patient's decision for other reasons: Secondary | ICD-10-CM | POA: Diagnosis not present

## 2023-09-02 DIAGNOSIS — I452 Bifascicular block: Secondary | ICD-10-CM | POA: Diagnosis present

## 2023-09-02 DIAGNOSIS — Z85118 Personal history of other malignant neoplasm of bronchus and lung: Secondary | ICD-10-CM | POA: Diagnosis not present

## 2023-09-02 DIAGNOSIS — R7989 Other specified abnormal findings of blood chemistry: Secondary | ICD-10-CM | POA: Diagnosis not present

## 2023-09-02 DIAGNOSIS — I214 Non-ST elevation (NSTEMI) myocardial infarction: Secondary | ICD-10-CM | POA: Diagnosis present

## 2023-09-02 DIAGNOSIS — N184 Chronic kidney disease, stage 4 (severe): Secondary | ICD-10-CM | POA: Diagnosis present

## 2023-09-02 DIAGNOSIS — I700 Atherosclerosis of aorta: Secondary | ICD-10-CM | POA: Diagnosis present

## 2023-09-02 DIAGNOSIS — U071 COVID-19: Secondary | ICD-10-CM | POA: Diagnosis present

## 2023-09-02 DIAGNOSIS — R0602 Shortness of breath: Secondary | ICD-10-CM | POA: Diagnosis not present

## 2023-09-02 DIAGNOSIS — I082 Rheumatic disorders of both aortic and tricuspid valves: Secondary | ICD-10-CM | POA: Diagnosis present

## 2023-09-02 DIAGNOSIS — Z902 Acquired absence of lung [part of]: Secondary | ICD-10-CM | POA: Diagnosis not present

## 2023-09-02 DIAGNOSIS — I129 Hypertensive chronic kidney disease with stage 1 through stage 4 chronic kidney disease, or unspecified chronic kidney disease: Secondary | ICD-10-CM | POA: Diagnosis present

## 2023-09-02 DIAGNOSIS — I77819 Aortic ectasia, unspecified site: Secondary | ICD-10-CM | POA: Diagnosis not present

## 2023-10-10 DIAGNOSIS — Z23 Encounter for immunization: Secondary | ICD-10-CM | POA: Diagnosis not present

## 2023-10-15 DIAGNOSIS — I10 Essential (primary) hypertension: Secondary | ICD-10-CM | POA: Diagnosis not present

## 2023-10-15 DIAGNOSIS — R54 Age-related physical debility: Secondary | ICD-10-CM | POA: Diagnosis not present

## 2023-10-15 DIAGNOSIS — R351 Nocturia: Secondary | ICD-10-CM | POA: Diagnosis not present

## 2023-10-15 DIAGNOSIS — I2510 Atherosclerotic heart disease of native coronary artery without angina pectoris: Secondary | ICD-10-CM | POA: Diagnosis not present

## 2023-10-15 DIAGNOSIS — Z8616 Personal history of COVID-19: Secondary | ICD-10-CM | POA: Diagnosis not present

## 2023-10-15 DIAGNOSIS — R634 Abnormal weight loss: Secondary | ICD-10-CM | POA: Diagnosis not present

## 2023-11-12 DIAGNOSIS — R921 Mammographic calcification found on diagnostic imaging of breast: Secondary | ICD-10-CM | POA: Diagnosis not present

## 2023-11-14 ENCOUNTER — Other Ambulatory Visit: Payer: Medicare Other

## 2023-11-14 ENCOUNTER — Encounter: Payer: Self-pay | Admitting: Hematology

## 2023-11-14 ENCOUNTER — Ambulatory Visit: Payer: Medicare Other | Admitting: Nurse Practitioner

## 2023-11-22 DIAGNOSIS — H524 Presbyopia: Secondary | ICD-10-CM | POA: Diagnosis not present

## 2023-11-22 DIAGNOSIS — H04123 Dry eye syndrome of bilateral lacrimal glands: Secondary | ICD-10-CM | POA: Diagnosis not present

## 2023-11-22 DIAGNOSIS — H532 Diplopia: Secondary | ICD-10-CM | POA: Diagnosis not present

## 2023-11-22 DIAGNOSIS — Z961 Presence of intraocular lens: Secondary | ICD-10-CM | POA: Diagnosis not present

## 2023-12-13 DIAGNOSIS — E538 Deficiency of other specified B group vitamins: Secondary | ICD-10-CM | POA: Diagnosis not present

## 2023-12-13 DIAGNOSIS — M810 Age-related osteoporosis without current pathological fracture: Secondary | ICD-10-CM | POA: Diagnosis not present

## 2023-12-13 DIAGNOSIS — D500 Iron deficiency anemia secondary to blood loss (chronic): Secondary | ICD-10-CM | POA: Diagnosis not present

## 2023-12-13 DIAGNOSIS — J449 Chronic obstructive pulmonary disease, unspecified: Secondary | ICD-10-CM | POA: Diagnosis not present

## 2023-12-13 DIAGNOSIS — R54 Age-related physical debility: Secondary | ICD-10-CM | POA: Diagnosis not present

## 2023-12-13 DIAGNOSIS — I2510 Atherosclerotic heart disease of native coronary artery without angina pectoris: Secondary | ICD-10-CM | POA: Diagnosis not present

## 2023-12-13 DIAGNOSIS — Z79899 Other long term (current) drug therapy: Secondary | ICD-10-CM | POA: Diagnosis not present

## 2023-12-13 DIAGNOSIS — R351 Nocturia: Secondary | ICD-10-CM | POA: Diagnosis not present

## 2023-12-13 DIAGNOSIS — Z23 Encounter for immunization: Secondary | ICD-10-CM | POA: Diagnosis not present

## 2023-12-13 DIAGNOSIS — Z0000 Encounter for general adult medical examination without abnormal findings: Secondary | ICD-10-CM | POA: Diagnosis not present

## 2023-12-13 DIAGNOSIS — I700 Atherosclerosis of aorta: Secondary | ICD-10-CM | POA: Diagnosis not present

## 2023-12-13 DIAGNOSIS — N183 Chronic kidney disease, stage 3 unspecified: Secondary | ICD-10-CM | POA: Diagnosis not present

## 2023-12-13 DIAGNOSIS — I10 Essential (primary) hypertension: Secondary | ICD-10-CM | POA: Diagnosis not present

## 2024-01-21 DIAGNOSIS — M8588 Other specified disorders of bone density and structure, other site: Secondary | ICD-10-CM | POA: Diagnosis not present

## 2024-01-21 DIAGNOSIS — E2839 Other primary ovarian failure: Secondary | ICD-10-CM | POA: Diagnosis not present

## 2024-01-21 DIAGNOSIS — N958 Other specified menopausal and perimenopausal disorders: Secondary | ICD-10-CM | POA: Diagnosis not present

## 2024-02-18 DIAGNOSIS — H903 Sensorineural hearing loss, bilateral: Secondary | ICD-10-CM | POA: Diagnosis not present

## 2024-04-03 DIAGNOSIS — M19071 Primary osteoarthritis, right ankle and foot: Secondary | ICD-10-CM | POA: Diagnosis not present

## 2024-04-03 DIAGNOSIS — M76821 Posterior tibial tendinitis, right leg: Secondary | ICD-10-CM | POA: Diagnosis not present

## 2024-04-23 DIAGNOSIS — L820 Inflamed seborrheic keratosis: Secondary | ICD-10-CM | POA: Diagnosis not present

## 2024-04-23 DIAGNOSIS — Z85828 Personal history of other malignant neoplasm of skin: Secondary | ICD-10-CM | POA: Diagnosis not present

## 2024-04-23 DIAGNOSIS — L723 Sebaceous cyst: Secondary | ICD-10-CM | POA: Diagnosis not present

## 2024-04-23 DIAGNOSIS — L821 Other seborrheic keratosis: Secondary | ICD-10-CM | POA: Diagnosis not present

## 2024-04-23 DIAGNOSIS — Z85820 Personal history of malignant melanoma of skin: Secondary | ICD-10-CM | POA: Diagnosis not present

## 2024-04-23 DIAGNOSIS — D692 Other nonthrombocytopenic purpura: Secondary | ICD-10-CM | POA: Diagnosis not present

## 2024-04-24 DIAGNOSIS — R300 Dysuria: Secondary | ICD-10-CM | POA: Diagnosis not present

## 2024-04-27 DIAGNOSIS — L7682 Other postprocedural complications of skin and subcutaneous tissue: Secondary | ICD-10-CM | POA: Diagnosis not present

## 2024-04-27 DIAGNOSIS — Z853 Personal history of malignant neoplasm of breast: Secondary | ICD-10-CM | POA: Diagnosis not present

## 2024-04-27 DIAGNOSIS — N6321 Unspecified lump in the left breast, upper outer quadrant: Secondary | ICD-10-CM | POA: Diagnosis not present

## 2024-04-28 ENCOUNTER — Encounter: Payer: Self-pay | Admitting: Nurse Practitioner

## 2024-04-28 DIAGNOSIS — D631 Anemia in chronic kidney disease: Secondary | ICD-10-CM | POA: Diagnosis not present

## 2024-04-28 DIAGNOSIS — R54 Age-related physical debility: Secondary | ICD-10-CM | POA: Diagnosis not present

## 2024-04-28 DIAGNOSIS — N184 Chronic kidney disease, stage 4 (severe): Secondary | ICD-10-CM | POA: Diagnosis not present

## 2024-04-28 DIAGNOSIS — I2510 Atherosclerotic heart disease of native coronary artery without angina pectoris: Secondary | ICD-10-CM | POA: Diagnosis not present

## 2024-04-28 DIAGNOSIS — I10 Essential (primary) hypertension: Secondary | ICD-10-CM | POA: Diagnosis not present

## 2024-04-29 ENCOUNTER — Other Ambulatory Visit: Payer: Self-pay | Admitting: Radiology

## 2024-04-29 DIAGNOSIS — N6321 Unspecified lump in the left breast, upper outer quadrant: Secondary | ICD-10-CM | POA: Diagnosis not present

## 2024-04-29 DIAGNOSIS — N6489 Other specified disorders of breast: Secondary | ICD-10-CM | POA: Diagnosis not present

## 2024-04-30 LAB — SURGICAL PATHOLOGY

## 2024-05-06 ENCOUNTER — Encounter: Payer: Self-pay | Admitting: Nurse Practitioner

## 2024-06-12 DIAGNOSIS — R2689 Other abnormalities of gait and mobility: Secondary | ICD-10-CM | POA: Diagnosis not present

## 2024-06-12 DIAGNOSIS — M76822 Posterior tibial tendinitis, left leg: Secondary | ICD-10-CM | POA: Diagnosis not present

## 2024-06-12 DIAGNOSIS — R55 Syncope and collapse: Secondary | ICD-10-CM | POA: Diagnosis not present

## 2024-06-12 DIAGNOSIS — Z90710 Acquired absence of both cervix and uterus: Secondary | ICD-10-CM | POA: Diagnosis not present

## 2024-06-12 DIAGNOSIS — Z681 Body mass index (BMI) 19 or less, adult: Secondary | ICD-10-CM | POA: Diagnosis not present

## 2024-10-13 ENCOUNTER — Encounter: Payer: Self-pay | Admitting: Cardiovascular Disease

## 2024-10-13 ENCOUNTER — Ambulatory Visit: Attending: Cardiovascular Disease | Admitting: Cardiovascular Disease

## 2024-10-13 VITALS — BP 144/80 | HR 57 | Ht 62.0 in | Wt 105.1 lb

## 2024-10-13 DIAGNOSIS — I495 Sick sinus syndrome: Secondary | ICD-10-CM | POA: Diagnosis not present

## 2024-10-13 NOTE — Progress Notes (Signed)
  Electrophysiology Office Note:    Date:  10/13/2024   ID:  Trevor Duty, DOB 02/06/1927, MRN [id number]  PCP:  Delice Charleston, MD (Inactive)   [facility] Providers Cardiologist:  None     Referring MD: Dwight Trula SQUIBB, MD   History of Present Illness:    April Burns is a 89 y.o. female with a medical history significant for hypertension, pulmonary lobectomy due to fungal infection referred for management of sick sinus syndrome.         History of Present Illness  Sick sinus syndrome was diagnosed by the patient's primary care physician while she was living with her daughter in [place] .  A ZIO monitor was placed due to patient's complaints of episodes of lightheadedness.  The patient reports that it showed episodes of slow heart rates and even a pause of up to 9 seconds, but she is unaware of what time of day or night this pause occurred.   Dr. Aileen note indicates he has requested records and results of the ZIO monitor.  We have not received these results.  She has been taking metoprolol for hypertension.        Today, she reports that she feels well and has no complaints.  EKGs/Labs/Other Studies Reviewed Today:      EKG:   EKG Interpretation Date/Time:  Tuesday October 13 2024 11:22:01 EDT Ventricular Rate:  57 PR Interval:  162 QRS Duration:  118 QT Interval:  408 QTC Calculation: 397 R Axis:   -20  Text Interpretation: Sinus bradycardia Incomplete right bundle branch block When compared with ECG of 18-May-2022 08:53, Left anterior fascicular block is no longer Present Criteria for Septal infarct are no longer Present Nonspecific T wave abnormality no longer evident in Anterior leads Confirmed by Nancey Scotts [phone]) on 10/13/2024 11:29:40 AM     Physical Exam:    VS:  BP (!) 144/80 (BP Location: Right Arm, Patient Position: Sitting, Cuff Size: Normal)   Pulse (!) 57   Ht 5' 2 (1.575 m)   Wt 105 lb 1.6 oz  (47.7 kg)   SpO2 100%   BMI 19.22 kg/m     Wt Readings from Last 3 Encounters:  10/13/24 105 lb 1.6 oz (47.7 kg)  05/14/23 111 lb 1.6 oz (50.4 kg)  02/21/23 114 lb 3.2 oz (51.8 kg)     GEN: Well nourished, well developed in no acute distress CARDIAC: RRR, no murmurs, rubs, gallops RESPIRATORY:  Normal work of breathing MUSCULOSKELETAL: no edema    ASSESSMENT & PLAN:     History of sick sinus syndrome Per patient report I have not been able to review records She relates that she was shown a pause of 9 seconds on her monitor, but she is not sure whether this occurred during daytime or nighttime She has not had syncope, but she has had transient dizziness Discontinue metoprolol  Intraventricular conduction disease Right bundle branch block Left anterior fascicular block  Hypertension I asked her to monitor her blood pressures after discontinuing metoprolol She will follow-up with her PCP  History of CKD stage IV Could present issues with metoprolol     Signed, Scotts FORBES Nancey, MD  10/13/2024 11:40 AM    [facility]

## 2024-10-13 NOTE — Patient Instructions (Signed)
 Medication Instructions:  Your physician has recommended you make the following change in your medication:   ** Stop Metoprolol  *If you need a refill on your cardiac medications before your next appointment, please call your pharmacy*  Lab Work: None ordered.  If you have labs (blood work) drawn today and your tests are completely normal, you will receive your results only by: MyChart Message (if you have MyChart) OR A paper copy in the mail If you have any lab test that is abnormal or we need to change your treatment, we will call you to review the results.  Testing/Procedures: None ordered.   Follow-Up: At [facility], you and your health needs are our priority.  As part of our continuing mission to provide you with exceptional heart care, our providers are all part of one team.  This team includes your primary Cardiologist (physician) and Advanced Practice Providers or APPs (Physician Assistants and Nurse Practitioners) who all work together to provide you with the care you need, when you need it.  Your next appointment:   2 months with Dr Mealor

## 2024-12-22 ENCOUNTER — Ambulatory Visit: Admitting: Cardiovascular Disease

## 2025-03-10 ENCOUNTER — Ambulatory Visit: Admitting: Cardiovascular Disease
